# Patient Record
Sex: Male | Born: 1950 | Race: White | Hispanic: No | Smoking: Former smoker
Health system: Southern US, Community
[De-identification: ages and names within clinical notes are randomized; demographics above are authoritative.]

## PROBLEM LIST (undated history)

## (undated) DIAGNOSIS — IMO0001 Reserved for inherently not codable concepts without codable children: Secondary | ICD-10-CM

## (undated) DIAGNOSIS — I350 Nonrheumatic aortic (valve) stenosis: Secondary | ICD-10-CM

## (undated) DIAGNOSIS — I10 Essential (primary) hypertension: Secondary | ICD-10-CM

## (undated) DIAGNOSIS — I517 Cardiomegaly: Secondary | ICD-10-CM

## (undated) DIAGNOSIS — I4891 Unspecified atrial fibrillation: Secondary | ICD-10-CM

## (undated) DIAGNOSIS — I509 Heart failure, unspecified: Secondary | ICD-10-CM

## (undated) DIAGNOSIS — Z923 Personal history of irradiation: Secondary | ICD-10-CM

## (undated) HISTORY — PX: FRACTURE SURGERY: SHX138

## (undated) HISTORY — PX: COLONOSCOPY: SHX174

## (undated) HISTORY — PX: NECK SURGERY: SHX720

---

## 1998-09-24 ENCOUNTER — Emergency Department (HOSPITAL_COMMUNITY): Admission: EM | Admit: 1998-09-24 | Discharge: 1998-09-24 | Payer: Self-pay | Admitting: Emergency Medicine

## 1998-09-24 ENCOUNTER — Encounter: Payer: Self-pay | Admitting: Emergency Medicine

## 1999-11-04 ENCOUNTER — Encounter: Payer: Self-pay | Admitting: Emergency Medicine

## 1999-11-04 ENCOUNTER — Inpatient Hospital Stay (HOSPITAL_COMMUNITY): Admission: EM | Admit: 1999-11-04 | Discharge: 1999-11-06 | Payer: Self-pay | Admitting: Emergency Medicine

## 1999-11-05 ENCOUNTER — Encounter: Payer: Self-pay | Admitting: Emergency Medicine

## 1999-11-13 ENCOUNTER — Encounter: Admission: RE | Admit: 1999-11-13 | Discharge: 1999-11-13 | Payer: Self-pay | Admitting: Family Medicine

## 1999-12-31 ENCOUNTER — Encounter: Payer: Self-pay | Admitting: Emergency Medicine

## 1999-12-31 ENCOUNTER — Emergency Department (HOSPITAL_COMMUNITY): Admission: EM | Admit: 1999-12-31 | Discharge: 1999-12-31 | Payer: Self-pay | Admitting: Emergency Medicine

## 2005-09-08 ENCOUNTER — Inpatient Hospital Stay (HOSPITAL_COMMUNITY): Admission: EM | Admit: 2005-09-08 | Discharge: 2005-09-10 | Payer: Self-pay | Admitting: Emergency Medicine

## 2013-02-05 DIAGNOSIS — G44209 Tension-type headache, unspecified, not intractable: Secondary | ICD-10-CM | POA: Insufficient documentation

## 2013-04-22 DIAGNOSIS — Z8679 Personal history of other diseases of the circulatory system: Secondary | ICD-10-CM | POA: Insufficient documentation

## 2013-04-22 DIAGNOSIS — E669 Obesity, unspecified: Secondary | ICD-10-CM | POA: Insufficient documentation

## 2013-10-21 DIAGNOSIS — IMO0002 Reserved for concepts with insufficient information to code with codable children: Secondary | ICD-10-CM | POA: Insufficient documentation

## 2013-11-01 DIAGNOSIS — I10 Essential (primary) hypertension: Secondary | ICD-10-CM | POA: Insufficient documentation

## 2014-02-17 DIAGNOSIS — I4891 Unspecified atrial fibrillation: Secondary | ICD-10-CM | POA: Insufficient documentation

## 2014-02-17 DIAGNOSIS — Z7901 Long term (current) use of anticoagulants: Secondary | ICD-10-CM | POA: Insufficient documentation

## 2015-01-03 ENCOUNTER — Encounter (HOSPITAL_COMMUNITY): Payer: Self-pay | Admitting: *Deleted

## 2015-01-03 ENCOUNTER — Observation Stay (HOSPITAL_COMMUNITY)
Admission: EM | Admit: 2015-01-03 | Discharge: 2015-01-04 | Discharge status: Against Medical Advice | Payer: Self-pay | Attending: Internal Medicine | Admitting: Internal Medicine

## 2015-01-03 ENCOUNTER — Emergency Department (HOSPITAL_COMMUNITY): Payer: Self-pay

## 2015-01-03 DIAGNOSIS — I5033 Acute on chronic diastolic (congestive) heart failure: Principal | ICD-10-CM | POA: Insufficient documentation

## 2015-01-03 DIAGNOSIS — F17290 Nicotine dependence, other tobacco product, uncomplicated: Secondary | ICD-10-CM | POA: Insufficient documentation

## 2015-01-03 DIAGNOSIS — E785 Hyperlipidemia, unspecified: Secondary | ICD-10-CM | POA: Insufficient documentation

## 2015-01-03 DIAGNOSIS — I4891 Unspecified atrial fibrillation: Secondary | ICD-10-CM | POA: Diagnosis present

## 2015-01-03 DIAGNOSIS — Z7901 Long term (current) use of anticoagulants: Secondary | ICD-10-CM

## 2015-01-03 DIAGNOSIS — I350 Nonrheumatic aortic (valve) stenosis: Secondary | ICD-10-CM | POA: Diagnosis present

## 2015-01-03 DIAGNOSIS — R0601 Orthopnea: Secondary | ICD-10-CM

## 2015-01-03 DIAGNOSIS — I50810 Right heart failure, unspecified: Secondary | ICD-10-CM

## 2015-01-03 DIAGNOSIS — I482 Chronic atrial fibrillation: Secondary | ICD-10-CM

## 2015-01-03 DIAGNOSIS — R7989 Other specified abnormal findings of blood chemistry: Secondary | ICD-10-CM

## 2015-01-03 DIAGNOSIS — I517 Cardiomegaly: Secondary | ICD-10-CM | POA: Diagnosis present

## 2015-01-03 DIAGNOSIS — R0600 Dyspnea, unspecified: Secondary | ICD-10-CM | POA: Diagnosis present

## 2015-01-03 DIAGNOSIS — I10 Essential (primary) hypertension: Secondary | ICD-10-CM | POA: Diagnosis present

## 2015-01-03 HISTORY — DX: Unspecified atrial fibrillation: I48.91

## 2015-01-03 HISTORY — DX: Essential (primary) hypertension: I10

## 2015-01-03 HISTORY — DX: Cardiomegaly: I51.7

## 2015-01-03 LAB — CBC WITH DIFFERENTIAL/PLATELET
BASOS PCT: 0 % (ref 0–1)
Basophils Absolute: 0 10*3/uL (ref 0.0–0.1)
EOS PCT: 1 % (ref 0–5)
Eosinophils Absolute: 0.1 10*3/uL (ref 0.0–0.7)
HEMATOCRIT: 44.7 % (ref 39.0–52.0)
HEMOGLOBIN: 15.8 g/dL (ref 13.0–17.0)
Lymphocytes Relative: 20 % (ref 12–46)
Lymphs Abs: 2.2 10*3/uL (ref 0.7–4.0)
MCH: 31.3 pg (ref 26.0–34.0)
MCHC: 35.3 g/dL (ref 30.0–36.0)
MCV: 88.7 fL (ref 78.0–100.0)
MONO ABS: 1 10*3/uL (ref 0.1–1.0)
MONOS PCT: 9 % (ref 3–12)
NEUTROS ABS: 7.7 10*3/uL (ref 1.7–7.7)
Neutrophils Relative %: 70 % (ref 43–77)
Platelets: 167 10*3/uL (ref 150–400)
RBC: 5.04 MIL/uL (ref 4.22–5.81)
RDW: 14.3 % (ref 11.5–15.5)
WBC: 10.9 10*3/uL — ABNORMAL HIGH (ref 4.0–10.5)

## 2015-01-03 LAB — COMPREHENSIVE METABOLIC PANEL
ALBUMIN: 3.8 g/dL (ref 3.5–5.0)
ALK PHOS: 51 U/L (ref 38–126)
ALT: 15 U/L — AB (ref 17–63)
AST: 20 U/L (ref 15–41)
Anion gap: 9 (ref 5–15)
BUN: 10 mg/dL (ref 6–20)
CALCIUM: 9.2 mg/dL (ref 8.9–10.3)
CHLORIDE: 100 mmol/L — AB (ref 101–111)
CO2: 25 mmol/L (ref 22–32)
Creatinine, Ser: 0.74 mg/dL (ref 0.61–1.24)
GFR calc Af Amer: >60 mL/min (ref >60)
GFR calc non Af Amer: >60 mL/min (ref >60)
Glucose, Bld: 105 mg/dL — ABNORMAL HIGH (ref 70–99)
Potassium: 4 mmol/L (ref 3.5–5.1)
Sodium: 134 mmol/L — ABNORMAL LOW (ref 135–145)
TOTAL PROTEIN: 6.7 g/dL (ref 6.5–8.1)
Total Bilirubin: 0.8 mg/dL (ref 0.3–1.2)

## 2015-01-03 LAB — BRAIN NATRIURETIC PEPTIDE: B NATRIURETIC PEPTIDE 5: 327 pg/mL — AB (ref 0.0–100.0)

## 2015-01-03 LAB — TROPONIN I

## 2015-01-03 LAB — I-STAT TROPONIN, ED: Troponin i, poc: 0 ng/mL (ref 0.00–0.08)

## 2015-01-03 LAB — APTT: aPTT: 40 seconds — ABNORMAL HIGH (ref 24–37)

## 2015-01-03 LAB — PROTIME-INR
INR: 1.6 — ABNORMAL HIGH (ref 0.00–1.49)
Prothrombin Time: 19.2 seconds — ABNORMAL HIGH (ref 11.6–15.2)

## 2015-01-03 MED ORDER — SODIUM CHLORIDE 0.9 % IJ SOLN: 3.0000 mL | INTRAMUSCULAR | Status: DC | PRN

## 2015-01-03 MED ORDER — ALBUTEROL SULFATE (2.5 MG/3ML) 0.083% IN NEBU: 2.5000 mg | INHALATION_SOLUTION | RESPIRATORY_TRACT | Status: DC | PRN

## 2015-01-03 MED ORDER — WARFARIN - PHARMACIST DOSING INPATIENT: Freq: Every day | Status: DC

## 2015-01-03 MED ORDER — ASPIRIN 81 MG PO CHEW
324.0000 mg | CHEWABLE_TABLET | Freq: Once | ORAL | Status: AC
  Administered 2015-01-03: 324 mg via ORAL
  Filled 2015-01-03: qty 4

## 2015-01-03 MED ORDER — ONDANSETRON HCL 4 MG/2ML IJ SOLN: 4.0000 mg | Freq: Four times a day (QID) | INTRAMUSCULAR | Status: DC | PRN

## 2015-01-03 MED ORDER — SODIUM CHLORIDE 0.9 % IV SOLN: 250.0000 mL | INTRAVENOUS | Status: DC | PRN

## 2015-01-03 MED ORDER — SODIUM CHLORIDE 0.9 % IJ SOLN
3.0000 mL | Freq: Two times a day (BID) | INTRAMUSCULAR | Status: DC
  Administered 2015-01-03 – 2015-01-04 (×2): 3 mL via INTRAVENOUS

## 2015-01-03 MED ORDER — ASPIRIN EC 81 MG PO TBEC
81.0000 mg | DELAYED_RELEASE_TABLET | Freq: Every day | ORAL | Status: DC
  Administered 2015-01-03 – 2015-01-04 (×2): 81 mg via ORAL
  Filled 2015-01-03 (×2): qty 1

## 2015-01-03 MED ORDER — METOPROLOL TARTRATE 12.5 MG HALF TABLET
12.5000 mg | ORAL_TABLET | Freq: Every day | ORAL | Status: DC
  Administered 2015-01-04: 12.5 mg via ORAL
  Filled 2015-01-03 (×2): qty 1

## 2015-01-03 MED ORDER — LISINOPRIL 40 MG PO TABS
40.0000 mg | ORAL_TABLET | Freq: Every day | ORAL | Status: DC
  Administered 2015-01-03 – 2015-01-04 (×2): 40 mg via ORAL
  Filled 2015-01-03 (×2): qty 1

## 2015-01-03 MED ORDER — AMLODIPINE BESYLATE 10 MG PO TABS
10.0000 mg | ORAL_TABLET | Freq: Every day | ORAL | Status: DC
  Administered 2015-01-03 – 2015-01-04 (×2): 10 mg via ORAL
  Filled 2015-01-03 (×2): qty 1

## 2015-01-03 MED ORDER — FUROSEMIDE 10 MG/ML IJ SOLN
40.0000 mg | Freq: Every day | INTRAMUSCULAR | Status: DC
  Administered 2015-01-03 – 2015-01-04 (×2): 40 mg via INTRAVENOUS
  Filled 2015-01-03 (×2): qty 4

## 2015-01-03 MED ORDER — ACETAMINOPHEN 325 MG PO TABS: 650.0000 mg | ORAL_TABLET | ORAL | Status: DC | PRN

## 2015-01-03 MED ORDER — WARFARIN SODIUM 5 MG PO TABS
5.0000 mg | ORAL_TABLET | Freq: Once | ORAL | Status: AC
  Administered 2015-01-03: 5 mg via ORAL
  Filled 2015-01-03: qty 1

## 2015-01-03 MED ORDER — GUAIFENESIN ER 600 MG PO TB12
600.0000 mg | ORAL_TABLET | Freq: Two times a day (BID) | ORAL | Status: DC
  Administered 2015-01-03 – 2015-01-04 (×2): 600 mg via ORAL
  Filled 2015-01-03 (×3): qty 1

## 2015-01-03 MED ORDER — PRAVASTATIN SODIUM 80 MG PO TABS
80.0000 mg | ORAL_TABLET | Freq: Every day | ORAL | Status: DC
  Administered 2015-01-03 – 2015-01-04 (×2): 80 mg via ORAL
  Filled 2015-01-03 (×2): qty 1

## 2015-01-03 NOTE — H&P (Addendum)
PCP: Alvino Blood [phone]   Referring provider Dorie Rank   Chief Complaint:  Abnormal CXR  HPI: Kevin Martin is a 64 y.o. male   has a past medical history of Hypertension and Atrial fibrillation.   Presented with 4 day hx of shortness of breath worse with laying down flat. He reports some bilateral leg swelling but its chronic and mild.  He denies any chest pain. He reports feeling smothered when he lay. Denies shortness of breath on exertion. He has had some cough productive of clear or white phlegm. Unsure if he had a fever but had some chills. He went to his PCP and had a CXR done. His PCP was concerned about CHF and asked patient to present to ER. In emergency department troponin was negative EKG showed mild ST segment depressions in the lateral leads. Chest x-ray showing cardiomegaly which is persistent from 2007 normal evidence of fluid overload.  Hospitalist was called for admission for cardiomegaly  Review of Systems:    Pertinent positives include:  Chills, chest pressure, shortness of breath at rest. productive cough, excess mucus   Constitutional:  No weight loss, night sweats, Fevers,  fatigue, weight loss  HEENT:  No headaches, Difficulty swallowing,Tooth/dental problems,Sore throat,  No sneezing, itching, ear ache, nasal congestion, post nasal drip,  Cardio-vascular:    Orthopnea, PND, anasarca, dizziness, palpitations.no Bilateral lower extremity swelling  GI:  No heartburn, indigestion, abdominal pain, nausea, vomiting, diarrhea, change in bowel habits, loss of appetite, melena, blood in stool, hematemesis Resp:  no No dyspnea on exertion,    No non-productive cough, No coughing up of blood.No change in color of mucus.No wheezing. Skin:  no rash or lesions. No jaundice GU:  no dysuria, change in color of urine, no urgency or frequency. No straining to urinate.  No flank pain.  Musculoskeletal:  No joint pain or no joint swelling. No decreased range of  motion. No back pain.  Psych:  No change in mood or affect. No depression or anxiety. No memory loss.  Neuro: no localizing neurological complaints, no tingling, no weakness, no double vision, no gait abnormality, no slurred speech, no confusion  Otherwise ROS are negative except for above, 10 systems were reviewed  Past Medical History: Past Medical History  Diagnosis Date  . Hypertension   . Atrial fibrillation    History reviewed. No pertinent past surgical history.   Medications: Prior to Admission medications   Medication Sig Start Date End Date Taking? Authorizing Provider  amLODipine (NORVASC) 10 MG tablet Take 10 mg by mouth daily.   Yes Historical Provider, MD  lisinopril (PRINIVIL,ZESTRIL) 40 MG tablet Take 40 mg by mouth daily.   Yes Historical Provider, MD  metoprolol tartrate (LOPRESSOR) 25 MG tablet Take 12.5 mg by mouth daily.    Yes Historical Provider, MD  Multiple Vitamin (MULTIVITAMIN WITH MINERALS) TABS tablet Take 1 tablet by mouth daily.   Yes Historical Provider, MD  omega-3 acid ethyl esters (LOVAZA) 1 G capsule Take 1 g by mouth 3 (three) times daily.   Yes Historical Provider, MD  pravastatin (PRAVACHOL) 80 MG tablet Take 80 mg by mouth daily.   Yes Historical Provider, MD  warfarin (COUMADIN) 5 MG tablet Take 5 mg by mouth daily.   Yes Historical Provider, MD    Allergies:  No Known Allergies  Social History:  Ambulatory  independently   Lives at home alone With family     reports that he has been smoking Cigars.  He does not have any smokeless tobacco history on file. He reports that he drinks alcohol. He reports that he does not use illicit drugs.    Family History: family history includes Cancer in his father and mother.    Physical Exam: Patient Vitals for the past 24 hrs:  BP Temp Pulse Resp SpO2 Height Weight  01/03/15 2115 129/56 mmHg - (!) 50 19 94 % - -  01/03/15 2045 132/69 mmHg - (!) 55 21 94 % - -  01/03/15 2015 (!) 123/53 mmHg -  71 20 95 % - -  01/03/15 2000 149/67 mmHg - (!) 55 18 97 % - -  01/03/15 1945 135/59 mmHg - (!) 55 22 94 % - -  01/03/15 1930 (!) 116/50 mmHg - (!) 52 23 94 % - -  01/03/15 1759 139/61 mmHg 98.4 F (36.9 C) 62 20 96 % '6\' 2"'$  (1.88 m) 106.595 kg (235 lb)    1. General:  in No Acute distress 2. Psychological: Alert and Oriented 3. Head/ENT:   Moist   Mucous Membranes                          Head Non traumatic, neck supple                          Normal  Dentition 4. SKIN: normal  Skin turgor,  Skin clean Dry and intact no rash 5. Heart: Regular rate and rhythm systolic Murmur, Rub or gallop 6. Lungs:  no wheezes mild bibasal or crackles   7. Abdomen: Soft, non-tender, slightly distended 8. Lower extremities: no clubbing, cyanosis, trace edema 9. Neurologically Grossly intact, moving all 4 extremities equally 10. MSK: Normal range of motion  body mass index is 30.16 kg/(m^2).   Labs on Admission:   Results for orders placed or performed during the hospital encounter of 01/03/15 (from the past 24 hour(s))  BNP (order ONLY if patient complains of dyspnea/SOB AND you have documented it for THIS visit)     Status: Abnormal   Collection Time: 01/03/15  6:22 PM  Result Value Ref Range   B Natriuretic Peptide 327.0 (H) 0.0 - 100.0 pg/mL  Troponin I     Status: None   Collection Time: 01/03/15  6:22 PM  Result Value Ref Range   Troponin I <0.03 <0.031 ng/mL  CBC with Differential     Status: Abnormal   Collection Time: 01/03/15  6:22 PM  Result Value Ref Range   WBC 10.9 (H) 4.0 - 10.5 K/uL   RBC 5.04 4.22 - 5.81 MIL/uL   Hemoglobin 15.8 13.0 - 17.0 g/dL   HCT 44.7 39.0 - 52.0 %   MCV 88.7 78.0 - 100.0 fL   MCH 31.3 26.0 - 34.0 pg   MCHC 35.3 30.0 - 36.0 g/dL   RDW 14.3 11.5 - 15.5 %   Platelets 167 150 - 400 K/uL   Neutrophils Relative % 70 43 - 77 %   Neutro Abs 7.7 1.7 - 7.7 K/uL   Lymphocytes Relative 20 12 - 46 %   Lymphs Abs 2.2 0.7 - 4.0 K/uL   Monocytes Relative 9 3 -  12 %   Monocytes Absolute 1.0 0.1 - 1.0 K/uL   Eosinophils Relative 1 0 - 5 %   Eosinophils Absolute 0.1 0.0 - 0.7 K/uL   Basophils Relative 0 0 - 1 %   Basophils Absolute 0.0 0.0 -  0.1 K/uL  Comprehensive metabolic panel     Status: Abnormal   Collection Time: 01/03/15  6:22 PM  Result Value Ref Range   Sodium 134 (L) 135 - 145 mmol/L   Potassium 4.0 3.5 - 5.1 mmol/L   Chloride 100 (L) 101 - 111 mmol/L   CO2 25 22 - 32 mmol/L   Glucose, Bld 105 (H) 70 - 99 mg/dL   BUN 10 6 - 20 mg/dL   Creatinine, Ser 0.74 0.61 - 1.24 mg/dL   Calcium 9.2 8.9 - 10.3 mg/dL   Total Protein 6.7 6.5 - 8.1 g/dL   Albumin 3.8 3.5 - 5.0 g/dL   AST 20 15 - 41 U/L   ALT 15 (L) 17 - 63 U/L   Alkaline Phosphatase 51 38 - 126 U/L   Total Bilirubin 0.8 0.3 - 1.2 mg/dL   GFR calc non Af Amer >60 >60 mL/min   GFR calc Af Amer >60 >60 mL/min   Anion gap 9 5 - 15  Protime-INR     Status: Abnormal   Collection Time: 01/03/15  6:22 PM  Result Value Ref Range   Prothrombin Time 19.2 (H) 11.6 - 15.2 seconds   INR 1.60 (H) 0.00 - 1.49  APTT     Status: Abnormal   Collection Time: 01/03/15  6:22 PM  Result Value Ref Range   aPTT 40 (H) 24 - 37 seconds  I-stat troponin, ED (not at [facility])     Status: None   Collection Time: 01/03/15  7:12 PM  Result Value Ref Range   Troponin i, poc 0.00 0.00 - 0.08 ng/mL   Comment 3            UA pending  No results found for: HGBA1C  Estimated Creatinine Clearance: 121.4 mL/min (by C-G formula based on Cr of 0.74).  BNP (last 3 results) No results for input(s): PROBNP in the last 8760 hours.  Other results:  I have pearsonaly reviewed this: ECG REPORT  Rate: 67  Rhythm: A. fib ST&T Change: with T wave inversion in lateral leads   Filed Weights   01/03/15 1759  Weight: 106.595 kg (235 lb)     Cultures: No results found for: SDES, SPECREQUEST, CULT, REPTSTATUS   Radiological Exams on Admission: Dg Chest 2 View  01/03/2015   CLINICAL DATA:  Smoker.  Elevated  white blood cell count.  EXAM: CHEST  2 VIEW  COMPARISON:  PA and lateral chest 09/08/2005.  FINDINGS: Mild prominence of the pulmonary interstitium is seen. No consolidative process, pneumothorax or effusion is identified. There is cardiomegaly. No focal bony abnormality is identified.  IMPRESSION: Cardiomegaly without acute disease.   Electronically Signed   By: Inge Rise M.D.   On: 01/03/2015 19:00    Chart has been reviewed  Family  at  Bedside  plan of care was discussed with partner [phone] does not have MPOA  Assessment/Plan  64 year old gentleman with history of atrial fibrillation chronic anticoagulation with history of chronic cardiomegaly presents with worsening orthopnea. With chest pressure when he lays down flat and abnormal EKG.  Present on Admission:  . Dyspnea - worrisome for orthopnea. Will obtain echogram cycle cardiac enzymes give gentle diuresis and follow versus mild bronchitis. We'll give Mucinex and albuterol as needed  . Cardiomegaly - this appears to be chronic and unchanged from 2007 there is no echo in the system will obtain echogram suspect CHF with mild exacerbation.  Marland Kitchen A-fib continue metoprolol holding parameters continue Coumadin  per pharmacy  . Cardiac murmur chronic Will obtain echogram continue home medications  . Hypertension continue home meds    Prophylaxis:  Lovenox   CODE STATUS:  FULL CODE  as per patient    Disposition:  To home once workup is complete and patient is stable  Other plan as per orders.  I have spent a total of 55 min on this admission  Bellatrix Devonshire 01/03/2015, 9:34 PM  [facility]  Pager [phone]   after 2 AM please page floor coverage PA If 7AM-7PM, please contact the day team taking care of the patient  [email or web address]  Password [email or web address]

## 2015-01-03 NOTE — ED Notes (Signed)
Attempted report 

## 2015-01-03 NOTE — ED Notes (Signed)
Admitting MD at bedside.

## 2015-01-03 NOTE — ED Notes (Signed)
The pt has had some sob since Sunday night.  He was seen by his doctor today had labs xray and ekg.  He was called this afternoon that his bmp // was elevated and that he needed to come to the ed.  nio pain anywhere. He is also not sob now.. He gets that when he lies down  At night

## 2015-01-03 NOTE — Progress Notes (Addendum)
[facility] for coumadin, lovenox bridge Indication: atrial fibrillation  No Known Allergies  Patient Measurements: Height: '6\' 2"'$  (188 cm) Weight: 236 lb (107.049 kg) IBW/kg (Calculated) : 82.2   Vital Signs: Temp: 98 F (36.7 C) (05/04 2201) Temp Source: Oral (05/04 2201) BP: 131/61 mmHg (05/04 2201) Pulse Rate: 56 (05/04 2201)  Labs:  Recent Labs  01/03/15 1822  HGB 15.8  HCT 44.7  PLT 167  APTT 40*  LABPROT 19.2*  INR 1.60*  CREATININE 0.74  TROPONINI <0.03    Estimated Creatinine Clearance: 121.5 mL/min (by C-G formula based on Cr of 0.74).   Medical History: Past Medical History  Diagnosis Date  . Hypertension   . Atrial fibrillation     Medications:  Prescriptions prior to admission  Medication Sig Dispense Refill Last Dose  . amLODipine (NORVASC) 10 MG tablet Take 10 mg by mouth daily.   01/03/2015 at Unknown time  . lisinopril (PRINIVIL,ZESTRIL) 40 MG tablet Take 40 mg by mouth daily.   01/03/2015 at Unknown time  . metoprolol tartrate (LOPRESSOR) 25 MG tablet Take 12.5 mg by mouth daily.    01/03/2015 at 0600  . Multiple Vitamin (MULTIVITAMIN WITH MINERALS) TABS tablet Take 1 tablet by mouth daily.   01/03/2015 at Unknown time  . omega-3 acid ethyl esters (LOVAZA) 1 G capsule Take 1 g by mouth 3 (three) times daily.   01/03/2015 at Unknown time  . pravastatin (PRAVACHOL) 80 MG tablet Take 80 mg by mouth daily.   01/02/2015 at Unknown time  . warfarin (COUMADIN) 5 MG tablet Take 5 mg by mouth daily.   01/02/2015 at Unknown time    Assessment: 64 yo man to continue coumadin for afib.  INR remains subtherapeutic @ 1.48.  Plan to start lovenox bridge until INR therapeutic.  No bleeding noted  Goal of Therapy:  INR 2-3 Monitor platelets by anticoagulation protocol: Yes   Plan:  Coumadin 6 mg po today Lovenox 105 mg sq q12 hours Check daily PTINR Monitor for bleeding complications Thanks for allowing pharmacy to be a part of  this patient's care.  Excell Seltzer, PharmD Clinical Pharmacist, [phone] 01/03/2015,10:09 PM

## 2015-01-03 NOTE — ED Provider Notes (Signed)
CSN: [id number]     Arrival date & time 01/03/15  1744 History   First MD Initiated Contact with Patient 01/03/15 1826     Chief Complaint  Patient presents with  . Abnormal Lab    HPI Pt was having pressure in his chest and a sensation that he was being smothered Sunday through last evening.  He saw his primary care doctor today and had blood tests.  He was told that the blood tests were abnormal.  Possible congestive heart failure and bronchial displacement but none of the tests were sent with him.  The symptoms occur every night.  Increase with lying flat.  Improve with sitting up right.  No chest pain.  Pt does smoke. Past Medical History  Diagnosis Date  . Hypertension   . Atrial fibrillation    History reviewed. No pertinent past surgical history. No family history on file. History  Substance Use Topics  . Smoking status: Current Every Day Smoker  . Smokeless tobacco: Not on file  . Alcohol Use: Yes    Review of Systems  All other systems reviewed and are negative.     Allergies  Review of patient's allergies indicates no known allergies.  Home Medications   Prior to Admission medications   Medication Sig Start Date End Date Taking? Authorizing Provider  amLODipine (NORVASC) 10 MG tablet Take 10 mg by mouth daily.   Yes Historical Provider, MD  lisinopril (PRINIVIL,ZESTRIL) 40 MG tablet Take 40 mg by mouth daily.   Yes Historical Provider, MD  metoprolol tartrate (LOPRESSOR) 25 MG tablet Take 12.5 mg by mouth 2 (two) times daily.   Yes Historical Provider, MD  Multiple Vitamin (MULTIVITAMIN WITH MINERALS) TABS tablet Take 1 tablet by mouth daily.   Yes Historical Provider, MD  omega-3 acid ethyl esters (LOVAZA) 1 G capsule Take 1 g by mouth 3 (three) times daily.   Yes Historical Provider, MD  pravastatin (PRAVACHOL) 80 MG tablet Take 80 mg by mouth daily.   Yes Historical Provider, MD  warfarin (COUMADIN) 5 MG tablet Take 5 mg by mouth daily.   Yes Historical Provider,  MD   BP 123/53 mmHg  Pulse 71  Temp(Src) 98.4 F (36.9 C)  Resp 20  Ht '6\' 2"'$  (1.88 m)  Wt 235 lb (106.595 kg)  BMI 30.16 kg/m2  SpO2 95% Physical Exam  Constitutional: He appears well-developed and well-nourished. No distress.  HENT:  Head: Normocephalic and atraumatic.  Right Ear: External ear normal.  Left Ear: External ear normal.  Eyes: Conjunctivae are normal. Right eye exhibits no discharge. Left eye exhibits no discharge. No scleral icterus.  Neck: Neck supple. No tracheal deviation present.  Cardiovascular: Normal rate and intact distal pulses.  An irregularly irregular rhythm present.  Murmur heard.  Systolic murmur is present  Pulmonary/Chest: Effort normal and breath sounds normal. No stridor. No respiratory distress. He has no wheezes. He has no rales (few at bases).  Abdominal: Soft. Bowel sounds are normal. He exhibits no distension. There is no tenderness. There is no rebound and no guarding.  Musculoskeletal: He exhibits edema. He exhibits no tenderness.  Neurological: He is alert. He has normal strength. No cranial nerve deficit (no facial droop, extraocular movements intact, no slurred speech) or sensory deficit. He exhibits normal muscle tone. He displays no seizure activity. Coordination normal.  Skin: Skin is warm and dry. No rash noted.  Psychiatric: He has a normal mood and affect.  Nursing note and vitals reviewed.   ED  Course  Procedures (including critical care time) Labs Review Labs Reviewed  BRAIN NATRIURETIC PEPTIDE - Abnormal; Notable for the following:    B Natriuretic Peptide 327.0 (*)    All other components within normal limits  CBC WITH DIFFERENTIAL/PLATELET - Abnormal; Notable for the following:    WBC 10.9 (*)    All other components within normal limits  COMPREHENSIVE METABOLIC PANEL - Abnormal; Notable for the following:    Sodium 134 (*)    Chloride 100 (*)    Glucose, Bld 105 (*)    ALT 15 (*)    All other components within normal  limits  PROTIME-INR - Abnormal; Notable for the following:    Prothrombin Time 19.2 (*)    INR 1.60 (*)    All other components within normal limits  APTT - Abnormal; Notable for the following:    aPTT 40 (*)    All other components within normal limits  TROPONIN I  Randolm Idol, ED    Imaging Review Dg Chest 2 View  01/03/2015   CLINICAL DATA:  Smoker.  Elevated white blood cell count.  EXAM: CHEST  2 VIEW  COMPARISON:  PA and lateral chest 09/08/2005.  FINDINGS: Mild prominence of the pulmonary interstitium is seen. No consolidative process, pneumothorax or effusion is identified. There is cardiomegaly. No focal bony abnormality is identified.  IMPRESSION: Cardiomegaly without acute disease.   Electronically Signed   By: Inge Rise M.D.   On: 01/03/2015 19:00     EKG Interpretation   Date/Time:  Wednesday Jan 03 2015 18:13:07 EDT Ventricular Rate:  67 PR Interval:    QRS Duration: 100 QT Interval:  418 QTC Calculation: 441 R Axis:   67 Text Interpretation:  Atrial fibrillation Septal infarct , age  undetermined ST' \\T'$ \ T wave abnormality, consider inferolateral ischemia  Abnormal ECG a fib is new since last tracing Confirmed by Kloi Brodman  MD-J, Owens Hara  ([id number]) on 01/03/2015 6:36:47 PM      MDM   Final diagnoses:  Elevated brain natriuretic peptide (BNP) level  Orthopnea    Pt's symptoms are concerning for CHF.  He does have orthopnea and an elevated BNP.  The chest x-ray shows cardiomegaly but no pulmonary edema.  Patient does not have a history of congestive heart failure.  Considered starting a diuretic and close outpatient follow up however he was sent to the hospital by his PCP today after the same testing done in the ED.  May benefit from diuresis over night, echocardiogram, cardiology consult.   Dorie Rank, MD 01/03/15 2045

## 2015-01-04 ENCOUNTER — Ambulatory Visit (HOSPITAL_COMMUNITY): Payer: Self-pay

## 2015-01-04 ENCOUNTER — Encounter (HOSPITAL_COMMUNITY): Payer: Self-pay | Admitting: General Practice

## 2015-01-04 DIAGNOSIS — I5033 Acute on chronic diastolic (congestive) heart failure: Principal | ICD-10-CM

## 2015-01-04 DIAGNOSIS — R011 Cardiac murmur, unspecified: Secondary | ICD-10-CM

## 2015-01-04 DIAGNOSIS — Z7901 Long term (current) use of anticoagulants: Secondary | ICD-10-CM

## 2015-01-04 LAB — COMPREHENSIVE METABOLIC PANEL
ALBUMIN: 3.7 g/dL (ref 3.5–5.0)
ALK PHOS: 57 U/L (ref 38–126)
ALT: 15 U/L — AB (ref 17–63)
AST: 19 U/L (ref 15–41)
Anion gap: 9 (ref 5–15)
BUN: 12 mg/dL (ref 6–20)
CO2: 27 mmol/L (ref 22–32)
Calcium: 9.1 mg/dL (ref 8.9–10.3)
Chloride: 102 mmol/L (ref 101–111)
Creatinine, Ser: 0.86 mg/dL (ref 0.61–1.24)
GFR calc Af Amer: >60 mL/min (ref >60)
GFR calc non Af Amer: >60 mL/min (ref >60)
Glucose, Bld: 117 mg/dL — ABNORMAL HIGH (ref 70–99)
POTASSIUM: 4.1 mmol/L (ref 3.5–5.1)
Sodium: 138 mmol/L (ref 135–145)
TOTAL PROTEIN: 6.7 g/dL (ref 6.5–8.1)
Total Bilirubin: 1 mg/dL (ref 0.3–1.2)

## 2015-01-04 LAB — BRAIN NATRIURETIC PEPTIDE: B NATRIURETIC PEPTIDE 5: 267.8 pg/mL — AB (ref 0.0–100.0)

## 2015-01-04 LAB — TROPONIN I
TROPONIN I: 0.03 ng/mL (ref <0.031)
Troponin I: <0.03 ng/mL (ref <0.031)

## 2015-01-04 LAB — TSH: TSH: 1.262 u[IU]/mL (ref 0.350–4.500)

## 2015-01-04 LAB — PROTIME-INR
INR: 1.48 (ref 0.00–1.49)
Prothrombin Time: 18.1 seconds — ABNORMAL HIGH (ref 11.6–15.2)

## 2015-01-04 LAB — MAGNESIUM: Magnesium: 1.9 mg/dL (ref 1.7–2.4)

## 2015-01-04 MED ORDER — WARFARIN SODIUM 6 MG PO TABS
6.0000 mg | ORAL_TABLET | Freq: Once | ORAL | Status: DC
  Filled 2015-01-04: qty 1

## 2015-01-04 MED ORDER — ENOXAPARIN SODIUM 120 MG/0.8ML ~~LOC~~ SOLN
105.0000 mg | Freq: Two times a day (BID) | SUBCUTANEOUS | Status: DC
  Filled 2015-01-04 (×2): qty 0.8

## 2015-01-04 NOTE — Progress Notes (Signed)
Pt stating his daughter was involved in a car accident in [facility].  Pt stating he is leaving.  MD notified to see if pt could get an early DC.  Pt is not medically ready for DC today according to MD.  Pt educated and still verbalized that he was leaving anyway.  Pt signed AMA paper and IV DC.

## 2015-01-04 NOTE — Discharge Summary (Signed)
Physician Discharge Summary  Kevin Martin LZJ:[id number] DOB: 1950-12-06 DOA: 01/03/2015  PCP: Alvino Blood [phone])  Admit date: 01/03/2015 Discharge date: 01/04/2015  Time spent: 30 minutes  Recommendations for Outpatient Follow-up:  Patient left AMA  Discharge Diagnoses:  Active Problems:   CHF (congestive heart failure)   Cardiomegaly   A-fib   Cardiac murmur   Chronic anticoagulation   Hypertension   Dyspnea   Discharge Condition: still with SOB and finidngs on physical exam that supported fluid overload. Patient left AMA.  Filed Weights   01/03/15 1759 01/03/15 2201 01/04/15 0542  Weight: 106.595 kg (235 lb) 107.049 kg (236 lb) 105.8 kg (233 lb 4 oz)    History of present illness:  64 y/o male with PMH significant for HTN, HLD and atrial fibrillation (on coumadin); Presented with 4 day hx of shortness of breath worse with laying down flat. He reports some bilateral leg swelling but its chronic and mild.  He denies any chest pain. He reports feeling smothered when he lay. Denies shortness of breath on exertion. He has had some cough productive of clear or white phlegm. Unsure if he had a fever but had some chills. He went to his PCP and had a CXR done. His PCP was concerned about CHF and asked patient to present to ER. Found with elavated BNP, ST depressions on EKG and vascular congestion/cardiomgaly on CXR. Admitted for CHF exacerbation.  Hospital Course:  1-SOB and orthopnea: due to acute on chronic diastolic heart failure. -preserved EF on echo -no wall motion abnormalities -moderate aortic stenosis (most likely contributing as well) -patient still SOB and with physical findings demnstrating ongoing HF exacerbation -will require 1-2 more days of IV lasix -discussed about importance of low sodium diet, daily weights and observation on fluid intake -will continue strict intake and output -troponin neg X3  2-atrial fibrillation: rate was stable -INR subtherapeutic  at 1.48 -plan was to bridge with lovenox while adjusting his coumadin level -goal was INR 2-3 -patient informed of need of adjustment and the fact he was not protected at this moment for cardioembolic event  3-HTN: BP stable -will monitor and continue home medication regimen  4-HLD: continue statins  *Patient heard his daughter had a MVA in [facility] and decide to leave AMA; he was not optimal for discharge and despite discussing the risk and consequences he opted to leave. By the time we discussed leaving AMA his 2-D echo was still pending to be read by cardiology; making even more difficult decisions of stability for him.  Procedures: 2-D echo - Left ventricle: The cavity size was normal. There was moderate concentric hypertrophy. Systolic function was normal. The estimated ejection fraction was in the range of 55% to 60%. Wall motion was normal; there were no regional wall motion abnormalities. - Aortic valve: Valve mobility was mildly restricted. There was moderate stenosis. There was mild regurgitation. - Aortic root: The aortic root was mildly dilated. - Mitral valve: There was mild regurgitation. - Left atrium: The atrium was severely dilated. - Right ventricle: The cavity size was mildly dilated. Wall thickness was normal. - Right atrium: The atrium was moderately dilated. - Pericardium, extracardiac: A trivial pericardial effusion was identified.    Consultations:  None   Discharge Exam: Filed Vitals:   01/04/15 0946  BP: 131/53  Pulse: 56  Temp:   Resp:     General: feeling slightly better, but still SOB and with dry cough. No CP and no fever Cardiovascular: S1 and S2,  positive SEM, no rubs or gallops; mild JVD appreciated on exam Respiratory: bibasilar crackles and decrease BS at the bases, no wheezing Abd: soft, NT, ND, positive BS Extremities: bilateral LE wit trace edema  Discharge Instructions No instructions able to be given as patient left  AMA.   Current Discharge Medication List    CONTINUE these medications which have NOT CHANGED   Details  amLODipine (NORVASC) 10 MG tablet Take 10 mg by mouth daily.    lisinopril (PRINIVIL,ZESTRIL) 40 MG tablet Take 40 mg by mouth daily.    metoprolol tartrate (LOPRESSOR) 25 MG tablet Take 12.5 mg by mouth daily.     Multiple Vitamin (MULTIVITAMIN WITH MINERALS) TABS tablet Take 1 tablet by mouth daily.    omega-3 acid ethyl esters (LOVAZA) 1 G capsule Take 1 g by mouth 3 (three) times daily.    pravastatin (PRAVACHOL) 80 MG tablet Take 80 mg by mouth daily.    warfarin (COUMADIN) 5 MG tablet Take 5 mg by mouth daily.       No Known Allergies    The results of significant diagnostics from this hospitalization (including imaging, microbiology, ancillary and laboratory) are listed below for reference.    Significant Diagnostic Studies: Dg Chest 2 View  01/03/2015   CLINICAL DATA:  Smoker.  Elevated white blood cell count.  EXAM: CHEST  2 VIEW  COMPARISON:  PA and lateral chest 09/08/2005.  FINDINGS: Mild prominence of the pulmonary interstitium is seen. No consolidative process, pneumothorax or effusion is identified. There is cardiomegaly. No focal bony abnormality is identified.  IMPRESSION: Cardiomegaly without acute disease.   Electronically Signed   By: Inge Rise M.D.   On: 01/03/2015 19:00    Labs: Basic Metabolic Panel:  Recent Labs Lab 01/03/15 1822 01/04/15 0415  NA 134* 138  K 4.0 4.1  CL 100* 102  CO2 25 27  GLUCOSE 105* 117*  BUN 10 12  CREATININE 0.74 0.86  CALCIUM 9.2 9.1  MG  --  1.9   Liver Function Tests:  Recent Labs Lab 01/03/15 1822 01/04/15 0415  AST 20 19  ALT 15* 15*  ALKPHOS 51 57  BILITOT 0.8 1.0  PROT 6.7 6.7  ALBUMIN 3.8 3.7   CBC:  Recent Labs Lab 01/03/15 1822  WBC 10.9*  NEUTROABS 7.7  HGB 15.8  HCT 44.7  MCV 88.7  PLT 167   Cardiac Enzymes:  Recent Labs Lab 01/03/15 1822 01/03/15 2314 01/04/15 0415  01/04/15 0937  TROPONINI <0.03 <0.03 0.03 <0.03   BNP: BNP (last 3 results)  Recent Labs  01/03/15 1822 01/04/15 0415  BNP 327.0* 267.8*     Signed:  Barton Dubois  [facility] 01/04/2015, 4:11 PM

## 2015-01-04 NOTE — Progress Notes (Signed)
*  PRELIMINARY RESULTS* Echocardiogram 2D Echocardiogram has been performed.  Leavy Cella 01/04/2015, 12:58 PM

## 2015-01-14 ENCOUNTER — Encounter (HOSPITAL_COMMUNITY): Payer: Self-pay | Admitting: Emergency Medicine

## 2015-01-14 ENCOUNTER — Inpatient Hospital Stay (HOSPITAL_COMMUNITY)
Admission: EM | Admit: 2015-01-14 | Discharge: 2015-01-16 | DRG: 291 | Disposition: A | Discharge status: Home / Self Care | Payer: Self-pay | Attending: Internal Medicine | Admitting: Internal Medicine

## 2015-01-14 ENCOUNTER — Emergency Department (HOSPITAL_COMMUNITY): Payer: Self-pay

## 2015-01-14 DIAGNOSIS — E871 Hypo-osmolality and hyponatremia: Secondary | ICD-10-CM | POA: Diagnosis present

## 2015-01-14 DIAGNOSIS — F17210 Nicotine dependence, cigarettes, uncomplicated: Secondary | ICD-10-CM | POA: Diagnosis present

## 2015-01-14 DIAGNOSIS — Z7901 Long term (current) use of anticoagulants: Secondary | ICD-10-CM

## 2015-01-14 DIAGNOSIS — I50811 Acute right heart failure: Secondary | ICD-10-CM | POA: Insufficient documentation

## 2015-01-14 DIAGNOSIS — Z9111 Patient's noncompliance with dietary regimen: Secondary | ICD-10-CM | POA: Diagnosis present

## 2015-01-14 DIAGNOSIS — I350 Nonrheumatic aortic (valve) stenosis: Secondary | ICD-10-CM

## 2015-01-14 DIAGNOSIS — J069 Acute upper respiratory infection, unspecified: Secondary | ICD-10-CM | POA: Diagnosis present

## 2015-01-14 DIAGNOSIS — I509 Heart failure, unspecified: Secondary | ICD-10-CM

## 2015-01-14 DIAGNOSIS — I5031 Acute diastolic (congestive) heart failure: Secondary | ICD-10-CM | POA: Insufficient documentation

## 2015-01-14 DIAGNOSIS — F1010 Alcohol abuse, uncomplicated: Secondary | ICD-10-CM | POA: Diagnosis present

## 2015-01-14 DIAGNOSIS — J449 Chronic obstructive pulmonary disease, unspecified: Secondary | ICD-10-CM | POA: Diagnosis present

## 2015-01-14 DIAGNOSIS — I4891 Unspecified atrial fibrillation: Secondary | ICD-10-CM | POA: Diagnosis present

## 2015-01-14 DIAGNOSIS — I5033 Acute on chronic diastolic (congestive) heart failure: Principal | ICD-10-CM | POA: Diagnosis present

## 2015-01-14 DIAGNOSIS — J9600 Acute respiratory failure, unspecified whether with hypoxia or hypercapnia: Secondary | ICD-10-CM | POA: Diagnosis present

## 2015-01-14 DIAGNOSIS — I482 Chronic atrial fibrillation: Secondary | ICD-10-CM | POA: Diagnosis present

## 2015-01-14 DIAGNOSIS — I10 Essential (primary) hypertension: Secondary | ICD-10-CM | POA: Diagnosis present

## 2015-01-14 HISTORY — DX: Reserved for inherently not codable concepts without codable children: IMO0001

## 2015-01-14 HISTORY — DX: Heart failure, unspecified: I50.9

## 2015-01-14 LAB — COMPREHENSIVE METABOLIC PANEL
ALT: 27 U/L (ref 17–63)
ANION GAP: 9 (ref 5–15)
AST: 36 U/L (ref 15–41)
Albumin: 3.8 g/dL (ref 3.5–5.0)
Alkaline Phosphatase: 56 U/L (ref 38–126)
BUN: 11 mg/dL (ref 6–20)
CO2: 24 mmol/L (ref 22–32)
Calcium: 8.9 mg/dL (ref 8.9–10.3)
Chloride: 93 mmol/L — ABNORMAL LOW (ref 101–111)
Creatinine, Ser: 0.78 mg/dL (ref 0.61–1.24)
GFR calc non Af Amer: >60 mL/min (ref >60)
GLUCOSE: 116 mg/dL — AB (ref 65–99)
POTASSIUM: 4.2 mmol/L (ref 3.5–5.1)
SODIUM: 126 mmol/L — AB (ref 135–145)
Total Bilirubin: 0.8 mg/dL (ref 0.3–1.2)
Total Protein: 6.7 g/dL (ref 6.5–8.1)

## 2015-01-14 LAB — CBC WITH DIFFERENTIAL/PLATELET
BASOS PCT: 0 % (ref 0–1)
Basophils Absolute: 0 10*3/uL (ref 0.0–0.1)
EOS PCT: 0 % (ref 0–5)
Eosinophils Absolute: 0 10*3/uL (ref 0.0–0.7)
HCT: 46.2 % (ref 39.0–52.0)
Hemoglobin: 16.3 g/dL (ref 13.0–17.0)
LYMPHS ABS: 1.1 10*3/uL (ref 0.7–4.0)
Lymphocytes Relative: 21 % (ref 12–46)
MCH: 30.5 pg (ref 26.0–34.0)
MCHC: 35.3 g/dL (ref 30.0–36.0)
MCV: 86.4 fL (ref 78.0–100.0)
MONOS PCT: 14 % — AB (ref 3–12)
Monocytes Absolute: 0.7 10*3/uL (ref 0.1–1.0)
NEUTROS PCT: 65 % (ref 43–77)
Neutro Abs: 3.4 10*3/uL (ref 1.7–7.7)
Platelets: 148 10*3/uL — ABNORMAL LOW (ref 150–400)
RBC: 5.35 MIL/uL (ref 4.22–5.81)
RDW: 13.9 % (ref 11.5–15.5)
WBC: 5.3 10*3/uL (ref 4.0–10.5)

## 2015-01-14 LAB — TROPONIN I
TROPONIN I: 0.03 ng/mL (ref <0.031)
Troponin I: 0.03 ng/mL (ref <0.031)

## 2015-01-14 LAB — PROTIME-INR
INR: 1.16 (ref 0.00–1.49)
PROTHROMBIN TIME: 14.9 s (ref 11.6–15.2)

## 2015-01-14 LAB — BRAIN NATRIURETIC PEPTIDE: B NATRIURETIC PEPTIDE 5: 246 pg/mL — AB (ref 0.0–100.0)

## 2015-01-14 MED ORDER — OMEGA-3-ACID ETHYL ESTERS 1 G PO CAPS
1.0000 g | ORAL_CAPSULE | Freq: Three times a day (TID) | ORAL | Status: DC
  Administered 2015-01-15 – 2015-01-16 (×4): 1 g via ORAL
  Filled 2015-01-14 (×8): qty 1

## 2015-01-14 MED ORDER — LORAZEPAM 1 MG PO TABS: 1.0000 mg | ORAL_TABLET | Freq: Four times a day (QID) | ORAL | Status: DC | PRN

## 2015-01-14 MED ORDER — POTASSIUM CHLORIDE CRYS ER 20 MEQ PO TBCR
40.0000 meq | EXTENDED_RELEASE_TABLET | Freq: Every day | ORAL | Status: DC
  Administered 2015-01-14 – 2015-01-16 (×3): 40 meq via ORAL
  Filled 2015-01-14 (×3): qty 2

## 2015-01-14 MED ORDER — VITAMIN B-1 100 MG PO TABS
100.0000 mg | ORAL_TABLET | Freq: Every day | ORAL | Status: DC
  Administered 2015-01-14 – 2015-01-16 (×3): 100 mg via ORAL
  Filled 2015-01-14 (×3): qty 1

## 2015-01-14 MED ORDER — ADULT MULTIVITAMIN W/MINERALS CH
1.0000 | ORAL_TABLET | Freq: Every day | ORAL | Status: DC
  Administered 2015-01-15 – 2015-01-16 (×2): 1 via ORAL
  Filled 2015-01-14 (×3): qty 1

## 2015-01-14 MED ORDER — THIAMINE HCL 100 MG/ML IJ SOLN
100.0000 mg | Freq: Every day | INTRAMUSCULAR | Status: DC
  Filled 2015-01-14: qty 1

## 2015-01-14 MED ORDER — METOPROLOL TARTRATE 12.5 MG HALF TABLET
12.5000 mg | ORAL_TABLET | Freq: Every day | ORAL | Status: DC
  Administered 2015-01-15: 12.5 mg via ORAL
  Filled 2015-01-14 (×2): qty 1

## 2015-01-14 MED ORDER — ONDANSETRON HCL 4 MG/2ML IJ SOLN: 4.0000 mg | Freq: Four times a day (QID) | INTRAMUSCULAR | Status: DC | PRN

## 2015-01-14 MED ORDER — FUROSEMIDE 10 MG/ML IJ SOLN
60.0000 mg | Freq: Two times a day (BID) | INTRAMUSCULAR | Status: DC
  Administered 2015-01-14 – 2015-01-15 (×2): 60 mg via INTRAVENOUS
  Filled 2015-01-14 (×2): qty 6

## 2015-01-14 MED ORDER — LORAZEPAM 2 MG/ML IJ SOLN: 1.0000 mg | Freq: Four times a day (QID) | INTRAMUSCULAR | Status: DC | PRN

## 2015-01-14 MED ORDER — SODIUM CHLORIDE 0.9 % IJ SOLN: 3.0000 mL | INTRAMUSCULAR | Status: DC | PRN

## 2015-01-14 MED ORDER — FUROSEMIDE 10 MG/ML IJ SOLN
40.0000 mg | Freq: Once | INTRAMUSCULAR | Status: AC
  Administered 2015-01-14: 40 mg via INTRAVENOUS
  Filled 2015-01-14: qty 4

## 2015-01-14 MED ORDER — WARFARIN SODIUM 7.5 MG PO TABS
7.5000 mg | ORAL_TABLET | Freq: Once | ORAL | Status: AC
  Administered 2015-01-14: 7.5 mg via ORAL
  Filled 2015-01-14: qty 1

## 2015-01-14 MED ORDER — ADULT MULTIVITAMIN W/MINERALS CH: 1.0000 | ORAL_TABLET | Freq: Every day | ORAL | Status: DC

## 2015-01-14 MED ORDER — ASPIRIN EC 81 MG PO TBEC
81.0000 mg | DELAYED_RELEASE_TABLET | Freq: Every day | ORAL | Status: DC
  Administered 2015-01-14 – 2015-01-16 (×3): 81 mg via ORAL
  Filled 2015-01-14 (×3): qty 1

## 2015-01-14 MED ORDER — PRAVASTATIN SODIUM 80 MG PO TABS
80.0000 mg | ORAL_TABLET | Freq: Every day | ORAL | Status: DC
  Administered 2015-01-14 – 2015-01-16 (×3): 80 mg via ORAL
  Filled 2015-01-14 (×3): qty 1

## 2015-01-14 MED ORDER — LISINOPRIL 40 MG PO TABS
40.0000 mg | ORAL_TABLET | Freq: Every day | ORAL | Status: DC
  Administered 2015-01-15 – 2015-01-16 (×2): 40 mg via ORAL
  Filled 2015-01-14 (×2): qty 1

## 2015-01-14 MED ORDER — LORAZEPAM 1 MG PO TABS
0.0000 mg | ORAL_TABLET | Freq: Four times a day (QID) | ORAL | Status: DC
  Administered 2015-01-15: 1 mg via ORAL
  Filled 2015-01-14: qty 4

## 2015-01-14 MED ORDER — FOLIC ACID 1 MG PO TABS
1.0000 mg | ORAL_TABLET | Freq: Every day | ORAL | Status: DC
Start: 2015-01-14 — End: 2015-01-16
  Administered 2015-01-14 – 2015-01-16 (×3): 1 mg via ORAL
  Filled 2015-01-14 (×3): qty 1

## 2015-01-14 MED ORDER — WARFARIN - PHARMACIST DOSING INPATIENT
Freq: Every day | Status: DC
  Administered 2015-01-14: 19:00:00
  Administered 2015-01-15: 1

## 2015-01-14 MED ORDER — LORAZEPAM 1 MG PO TABS: 0.0000 mg | ORAL_TABLET | Freq: Two times a day (BID) | ORAL | Status: DC

## 2015-01-14 MED ORDER — AMLODIPINE BESYLATE 10 MG PO TABS
10.0000 mg | ORAL_TABLET | Freq: Every day | ORAL | Status: DC
Start: 2015-01-15 — End: 2015-01-16
  Administered 2015-01-15 – 2015-01-16 (×2): 10 mg via ORAL
  Filled 2015-01-14 (×2): qty 1

## 2015-01-14 MED ORDER — SODIUM CHLORIDE 0.9 % IJ SOLN
3.0000 mL | Freq: Two times a day (BID) | INTRAMUSCULAR | Status: DC
  Administered 2015-01-14 – 2015-01-16 (×4): 3 mL via INTRAVENOUS

## 2015-01-14 MED ORDER — ACETAMINOPHEN 325 MG PO TABS: 650.0000 mg | ORAL_TABLET | ORAL | Status: DC | PRN

## 2015-01-14 MED ORDER — WARFARIN SODIUM 2.5 MG PO TABS: 2.5000 mg | ORAL_TABLET | ORAL | Status: DC

## 2015-01-14 MED ORDER — SODIUM CHLORIDE 0.9 % IV SOLN: 250.0000 mL | INTRAVENOUS | Status: DC | PRN

## 2015-01-14 NOTE — ED Notes (Signed)
Pt. Stated, I've been SOB for 2 weeks, and its been pretty bad.

## 2015-01-14 NOTE — Progress Notes (Signed)
ANTICOAGULATION CONSULT NOTE - Initial Consult  Pharmacy Consult for Warfarin  Indication: atrial fibrillation  No Known Allergies  Patient Measurements: Height: '6\' 2"'$  (188 cm) Weight: 238 lb (107.956 kg) IBW/kg (Calculated) : 82.2   Vital Signs: Temp: 98.7 F (37.1 C) (05/15 1205) Temp Source: Oral (05/15 1205) BP: 123/73 mmHg (05/15 1407) Pulse Rate: 60 (05/15 1407)  Labs:  Recent Labs  01/14/15 1239  HGB 16.3  HCT 46.2  PLT 148*  LABPROT 14.9  INR 1.16  CREATININE 0.78  TROPONINI 0.03    Estimated Creatinine Clearance: 122 mL/min (by C-G formula based on Cr of 0.78).   Medical History: Past Medical History  Diagnosis Date  . Hypertension   . Atrial fibrillation   . Cardiomegaly     Medications:   (Not in a hospital admission)  Assessment: 27 YOM with gradual onset and worsening of SOB for the past 2 weeks. He is on Coumadin at home for h/o of Afib. He takes Coumadin 2.5 mg on M/W/F and 5 mg on all other days. His last dose was Coumadin was yesterday. Pharmacy consulted to resume therapy. INR on admission is sub-therapeutic at 1.16. H/H wnl. Plt 148.   Goal of Therapy:  INR 2-3 Monitor platelets by anticoagulation protocol: Yes   Plan:  -Give Coumadin 7.5 mg x 1 dose today. Will plan on giving higher than home dose till patient's INR nears therapeutic range -Monitor daily PT/INR   Albertina Parr, PharmD., BCPS Clinical Pharmacist Pager [phone]

## 2015-01-14 NOTE — Progress Notes (Signed)
Known AF-- CHADVASC = 2  Erin Hearing ANP

## 2015-01-14 NOTE — ED Provider Notes (Signed)
CSN: [id number]     Arrival date & time 01/14/15  1159 History   First MD Initiated Contact with Patient 01/14/15 1217     Chief Complaint  Patient presents with  . Shortness of Breath      HPI Pt was seen at 1215. Per pt and his wife, c/o gradual onset and worsening of persistent SOB for the past 2 weeks. Pt was admitted to the hospital for acute CHF and left AMA on 01/04/15. Pt states he "will stay this time when you admit me." SOB worsens with laying flat. Denies CP/palpitations, no cough, no abd pain, no N/V/D, no pedal edema, no fevers.    Past Medical History  Diagnosis Date  . Hypertension   . Atrial fibrillation   . Cardiomegaly    Past Surgical History  Procedure Laterality Date  . Neck surgery    . Fracture surgery Right     right arm   Family History  Problem Relation Age of Onset  . Cancer Mother   . Cancer Father    History  Substance Use Topics  . Smoking status: Current Every Day Smoker -- 10 years    Types: Cigars  . Smokeless tobacco: Never Used     Comment: quit cigarette smoking 10 years ago  . Alcohol Use: Yes     Comment: every day 2-3 mixed drinks    Review of Systems ROS: Statement: All systems negative except as marked or noted in the HPI; Constitutional: Negative for fever and chills. ; ; Eyes: Negative for eye pain, redness and discharge. ; ; ENMT: Negative for ear pain, hoarseness, nasal congestion, sinus pressure and sore throat. ; ; Cardiovascular: +SOB. Negative for chest pain, palpitations, diaphoresis, and peripheral edema. ; ; Respiratory: Negative for cough, wheezing and stridor. ; ; Gastrointestinal: Negative for nausea, vomiting, diarrhea, abdominal pain, blood in stool, hematemesis, jaundice and rectal bleeding. . ; ; Genitourinary: Negative for dysuria, flank pain and hematuria. ; ; Musculoskeletal: Negative for back pain and neck pain. Negative for swelling and trauma.; ; Skin: Negative for pruritus, rash, abrasions, blisters, bruising and  skin lesion.; ; Neuro: Negative for headache, lightheadedness and neck stiffness. Negative for weakness, altered level of consciousness , altered mental status, extremity weakness, paresthesias, involuntary movement, seizure and syncope.      Allergies  Review of patient's allergies indicates no known allergies.  Home Medications   Prior to Admission medications   Medication Sig Start Date End Date Taking? Authorizing Provider  amLODipine (NORVASC) 10 MG tablet Take 10 mg by mouth daily.    Historical Provider, MD  lisinopril (PRINIVIL,ZESTRIL) 40 MG tablet Take 40 mg by mouth daily.    Historical Provider, MD  metoprolol tartrate (LOPRESSOR) 25 MG tablet Take 12.5 mg by mouth daily.     Historical Provider, MD  Multiple Vitamin (MULTIVITAMIN WITH MINERALS) TABS tablet Take 1 tablet by mouth daily.    Historical Provider, MD  omega-3 acid ethyl esters (LOVAZA) 1 G capsule Take 1 g by mouth 3 (three) times daily.    Historical Provider, MD  pravastatin (PRAVACHOL) 80 MG tablet Take 80 mg by mouth daily.    Historical Provider, MD  warfarin (COUMADIN) 5 MG tablet Take 5 mg by mouth daily.    Historical Provider, MD   BP 137/66 mmHg  Pulse 55  Temp(Src) 98.7 F (37.1 C) (Oral)  Resp 19  Ht '6\' 2"'$  (1.88 m)  Wt 238 lb (107.956 kg)  BMI 30.54 kg/m2  SpO2 96%  Physical Exam  1220; Physical examination:  Nursing notes reviewed; Vital signs and O2 SAT reviewed;  Constitutional: Well developed, Well nourished, Well hydrated, In no acute distress while sitting on side of stretcher.; Head:  Normocephalic, atraumatic; Eyes: EOMI, PERRL, No scleral icterus; ENMT: Mouth and pharynx normal, Mucous membranes moist; Neck: Supple, Full range of motion, No lymphadenopathy; Cardiovascular: Bradycardic rate and irregular rhythm, No gallop; Respiratory: Breath sounds coarse & equal bilaterally, No wheezes.  Speaking full sentences with ease, Normal respiratory effort/excursion; Chest: Nontender, Movement  normal; Abdomen: Soft, Nontender, Nondistended, Normal bowel sounds; Genitourinary: No CVA tenderness; Extremities: Pulses normal, No tenderness, +tr pedal edema bilat. No calf edema or asymmetry.; Neuro: AA&Ox3, Major CN grossly intact.  Speech clear. No gross focal motor or sensory deficits in extremities.; Skin: Color normal, Warm, Dry.   ED Course  Procedures     EKG Interpretation   Date/Time:  Sunday Jan 14 2015 12:05:35 EDT Ventricular Rate:  61 PR Interval:    QRS Duration: 86 QT Interval:  444 QTC Calculation: 446 R Axis:   79 Text Interpretation:  Atrial fibrillation with slow ventricular response  Anteroseptal infarct , age undetermined ST' \\T'$ \ T wave abnormality,  consider inferolateral ischemia Abnormal ECG When compared with ECG of  01/04/2015 ST-t wave abnormality is less pronounced Confirmed by [facility]   MD, Nunzio Cory [id number]) on 01/14/2015 12:48:10 PM      MDM  MDM Reviewed: previous chart, nursing note and vitals Reviewed previous: labs and ECG Interpretation: labs, ECG and x-ray Total time providing critical care: 30-74 minutes. This excludes time spent performing separately reportable procedures and services. Consults: admitting MD   CRITICAL CARE Performed by: Alfonzo Feller Total critical care time: 35 Critical care time was exclusive of separately billable procedures and treating other patients. Critical care was necessary to treat or prevent imminent or life-threatening deterioration. Critical care was time spent personally by me on the following activities: development of treatment plan with patient and/or surrogate as well as nursing, discussions with consultants, evaluation of patient's response to treatment, examination of patient, obtaining history from patient or surrogate, ordering and performing treatments and interventions, ordering and review of laboratory studies, ordering and review of radiographic studies, pulse oximetry and re-evaluation of  patient's condition.     Results for orders placed or performed during the hospital encounter of 01/14/15  Brain natriuretic peptide  Result Value Ref Range   B Natriuretic Peptide 246.0 (H) 0.0 - 100.0 pg/mL  Troponin I  Result Value Ref Range   Troponin I 0.03 <0.031 ng/mL  CBC with Differential  Result Value Ref Range   WBC 5.3 4.0 - 10.5 K/uL   RBC 5.35 4.22 - 5.81 MIL/uL   Hemoglobin 16.3 13.0 - 17.0 g/dL   HCT 46.2 39.0 - 52.0 %   MCV 86.4 78.0 - 100.0 fL   MCH 30.5 26.0 - 34.0 pg   MCHC 35.3 30.0 - 36.0 g/dL   RDW 13.9 11.5 - 15.5 %   Platelets 148 (L) 150 - 400 K/uL   Neutrophils Relative % 65 43 - 77 %   Neutro Abs 3.4 1.7 - 7.7 K/uL   Lymphocytes Relative 21 12 - 46 %   Lymphs Abs 1.1 0.7 - 4.0 K/uL   Monocytes Relative 14 (H) 3 - 12 %   Monocytes Absolute 0.7 0.1 - 1.0 K/uL   Eosinophils Relative 0 0 - 5 %   Eosinophils Absolute 0.0 0.0 - 0.7 K/uL   Basophils Relative 0 0 -  1 %   Basophils Absolute 0.0 0.0 - 0.1 K/uL  Protime-INR  Result Value Ref Range   Prothrombin Time 14.9 11.6 - 15.2 seconds   INR 1.16 0.00 - 1.49  Comprehensive metabolic panel  Result Value Ref Range   Sodium 126 (L) 135 - 145 mmol/L   Potassium 4.2 3.5 - 5.1 mmol/L   Chloride 93 (L) 101 - 111 mmol/L   CO2 24 22 - 32 mmol/L   Glucose, Bld 116 (H) 65 - 99 mg/dL   BUN 11 6 - 20 mg/dL   Creatinine, Ser 0.78 0.61 - 1.24 mg/dL   Calcium 8.9 8.9 - 10.3 mg/dL   Total Protein 6.7 6.5 - 8.1 g/dL   Albumin 3.8 3.5 - 5.0 g/dL   AST 36 15 - 41 U/L   ALT 27 17 - 63 U/L   Alkaline Phosphatase 56 38 - 126 U/L   Total Bilirubin 0.8 0.3 - 1.2 mg/dL   GFR calc non Af Amer >60 >60 mL/min   GFR calc Af Amer >60 >60 mL/min   Anion gap 9 5 - 15   Dg Chest 2 View 01/14/2015   CLINICAL DATA:  Cough and shortness of breath for 1 month  EXAM: CHEST  2 VIEW  COMPARISON:  Jan 03, 2015  FINDINGS: The heart size and mediastinal contours are stable. The heart size is enlarged. There is mild prominence of  pulmonary interstitium unchanged. There is no focal consolidation, pleural effusion or pneumothorax. The visualized skeletal structures are stable.  IMPRESSION: No active cardiopulmonary disease.  Stable cardiomegaly.   Electronically Signed   By: Abelardo Diesel M.D.   On: 01/14/2015 13:39    1350:  IV lasix given. New hyponatremia on today's labs. VS remain stable. Dx and testing d/w pt and family.  Questions answered.  Verb understanding, agreeable to admit. T/C to [facility] Merlin, case discussed, including:  HPI, pertinent PM/SHx, VS/PE, dx testing, ED course and treatment:  Agreeable to admit, requests they will come to the ED for evaluation.   Francine Graven, DO 01/17/15 1138

## 2015-01-14 NOTE — H&P (Signed)
[facility] History and Physical                                                                                    Kevin Martin, is a 64 y.o. male  MRN: [id number]   DOB - [birth year]  Admit Date - 01/14/2015  Outpatient Primary MD for the patient is No primary care provider on file.  Referring MD: Thurnell Garbe / ER  With History of -  Past Medical History  Diagnosis Date  . Hypertension   . Atrial fibrillation   . Cardiomegaly       Past Surgical History  Procedure Laterality Date  . Neck surgery    . Fracture surgery Right     right arm    in for   Chief Complaint  Patient presents with  . Shortness of Breath     HPI This is a 64 year old male patient with long-standing hypertension. During previous admission in 2013 documented as untreated hypertension. Patient also has a history of atrial fibrillation on Coumadin. He was initially admitted 10 days ago for dyspnea with orthopnea concerning for heart failure exacerbation. Unfortunately on 5/5 before evaluation could be completed and before patient could be stabilized from a respiratory standpoint he left AMA stating his daughter was in a motor vehicle crash in [place] and had to leave. Patient returned to the ER today for persistent shortness of breath; this has been associated with orthopnea and nocturnal dyspnea noting patient unable to sleep. He denied lower extremity edema, weight gain or dyspnea on exertion. He has been reporting nasal congestion with associated fevers and chills for several days. This past Friday he had chest pressure in the center of his chest that lasted all day. This pain did not radiate. It was not associated with diaphoresis or change in his shortness of breath. No apparent change in chest pain with general mobilization. According to his family at the bedside patient snores very heavily.  In the ER patient is afebrile, pulse is controlled at 55 bpm and he is in nature fibrillation, blood  pressures 137/66. He is maintaining room air saturations at 96%. Patient's sodium was down to 126 noting sodium was 138 on 5/5. Otherwise the panel unremarkable. Glucose slightly elevated at 116. BNP 246. Initial troponin 0.03. EKG stable with a fibrillation slow ventricular response with inverted T waves in inferior lateral leads similar to EKG last admission. Unfortunately no prior EKGs for comparison. Please note during last hospitalization patient's enzymes remained negative. CBC was normal except for mildly decreased platelets of 148,000 and elevated monocytes of 14. INR subtherapeutic at 1.16. Chest x-ray with findings consistent with COPD but no other acute cardiopulmonary findings.   Review of Systems   In addition to the HPI above,  No myalgias or other constitutional symptoms No Headache, changes with Vision or hearing, new weakness, tingling, numbness in any extremity, No problems swallowing food or Liquids, indigestion/reflux No Abdominal pain, N/V; no melena or hematochezia, no dark tarry stools, Bowel movements are regular, No dysuria, hematuria or flank pain No new skin rashes, lesions, masses or bruises, No new joints pains-aches No recent weight gain or loss No polyuria,  polydypsia or polyphagia,  *A full 10 point Review of Systems was done, except as stated above, all other Review of Systems were negative.  Social History History  Substance Use Topics  . Smoking status: Current Every Day Smoker -- 10 years    Types: Cigars  . Smokeless tobacco: Never Used     Comment: quit cigarette smoking 10 years ago  . Alcohol Use: Yes     Comment: Consumes up to 6 mixed drinks per day     Resides at: Private residence  Lives with: Alone-patient is a widower for at least 10 years  Ambulatory status: Ambulatory without assistive devices prior to admission   Family History Family History  Problem Relation Age of Onset  . Cancer Mother   . Cancer Father      Prior to  Admission medications   Medication Sig Start Date End Date Taking? Authorizing Provider  amLODipine (NORVASC) 10 MG tablet Take 10 mg by mouth daily.   Yes Historical Provider, MD  furosemide (LASIX) 40 MG tablet Take 40 mg by mouth daily.   Yes Historical Provider, MD  lisinopril (PRINIVIL,ZESTRIL) 40 MG tablet Take 40 mg by mouth daily.   Yes Historical Provider, MD  metoprolol tartrate (LOPRESSOR) 25 MG tablet Take 12.5 mg by mouth daily.    Yes Historical Provider, MD  Multiple Vitamin (MULTIVITAMIN WITH MINERALS) TABS tablet Take 1 tablet by mouth daily.   Yes Historical Provider, MD  omega-3 acid ethyl esters (LOVAZA) 1 G capsule Take 1 g by mouth 3 (three) times daily.   Yes Historical Provider, MD  pravastatin (PRAVACHOL) 80 MG tablet Take 80 mg by mouth daily.   Yes Historical Provider, MD  warfarin (COUMADIN) 5 MG tablet Take 2.5-5 mg by mouth See admin instructions. Patient takes 2.5 mg on Monday Wednesday Friday. All other days patient takes 5 mg   Yes Historical Provider, MD    No Known Allergies  Physical Exam  Vitals  Blood pressure 123/73, pulse 60, temperature 98.7 F (37.1 C), temperature source Oral, resp. rate 19, height '6\' 2"'$  (1.88 m), weight 238 lb (107.956 kg), SpO2 97 %.   General:  In mild acute respiratory distress evidenced by inability to lay flat on stretcher, appears healthy and well nourished  Psych:  Normal affect, Denies Suicidal or Homicidal ideations, Awake Alert, Oriented X 3. Speech and thought patterns are clear and appropriate, no apparent short term memory deficits  Neuro:   No focal neurological deficits, CN II through XII intact, Strength 5/5 all 4 extremities, Sensation intact all 4 extremities.  ENT:  Ears and Eyes appear Normal, Conjunctivae clear, PER. Moist oral mucosa without erythema or exudates. Prominent nasal congestion and clear rhinorrhea  Neck:  Supple, No lymphadenopathy appreciated  Respiratory:  Symmetrical chest wall movement,  Good air movement bilaterally, coarse to auscultation with scattered expiratory crackles mainly in the right mid fields to bases Room Air  Cardiac: Irregular-atrial fibrillation, harsh pansystolic murmur left sternal border directly adjacent to sternum at second intercostal space- grade 4/6, no LE edema noted, no JVD, No carotid bruits, peripheral pulses palpable at 2+  Abdomen:  Positive bowel sounds, Soft, Non tender, Non distended,  No masses appreciated, no obvious hepatosplenomegaly  Skin:  No Cyanosis, Normal Skin Turgor, No Skin Rash or Bruise.  Extremities: Symmetrical without obvious trauma or injury although right leg appears somewhat larger than left leg below the knee,  no effusions.  Data Review  CBC  Recent Labs Lab 01/14/15 1239  WBC 5.3  HGB 16.3  HCT 46.2  PLT 148*  MCV 86.4  MCH 30.5  MCHC 35.3  RDW 13.9  LYMPHSABS 1.1  MONOABS 0.7  EOSABS 0.0  BASOSABS 0.0    Chemistries   Recent Labs Lab 01/14/15 1239  NA 126*  K 4.2  CL 93*  CO2 24  GLUCOSE 116*  BUN 11  CREATININE 0.78  CALCIUM 8.9  AST 36  ALT 27  ALKPHOS 56  BILITOT 0.8    estimated creatinine clearance is 122 mL/min (by C-G formula based on Cr of 0.78).  No results for input(s): TSH, T4TOTAL, T3FREE, THYROIDAB in the last 72 hours.  Invalid input(s): FREET3  Coagulation profile  Recent Labs Lab 01/14/15 1239  INR 1.16    No results for input(s): DDIMER in the last 72 hours.  Cardiac Enzymes  Recent Labs Lab 01/14/15 1239  TROPONINI 0.03    Invalid input(s): POCBNP  Urinalysis No results found for: COLORURINE, APPEARANCEUR, LABSPEC, PHURINE, GLUCOSEU, HGBUR, BILIRUBINUR, KETONESUR, PROTEINUR, UROBILINOGEN, NITRITE, LEUKOCYTESUR  Imaging results:   Dg Chest 2 View  01/14/2015   CLINICAL DATA:  Cough and shortness of breath for 1 month  EXAM: CHEST  2 VIEW  COMPARISON:  Jan 03, 2015  FINDINGS: The heart size and mediastinal contours are stable. The heart size is  enlarged. There is mild prominence of pulmonary interstitium unchanged. There is no focal consolidation, pleural effusion or pneumothorax. The visualized skeletal structures are stable.  IMPRESSION: No active cardiopulmonary disease.  Stable cardiomegaly.   Electronically Signed   By: Abelardo Diesel M.D.   On: 01/14/2015 13:39   Dg Chest 2 View  01/03/2015   CLINICAL DATA:  Smoker.  Elevated white blood cell count.  EXAM: CHEST  2 VIEW  COMPARISON:  PA and lateral chest 09/08/2005.  FINDINGS: Mild prominence of the pulmonary interstitium is seen. No consolidative process, pneumothorax or effusion is identified. There is cardiomegaly. No focal bony abnormality is identified.  IMPRESSION: Cardiomegaly without acute disease.   Electronically Signed   By: Inge Rise M.D.   On: 01/03/2015 19:00     EKG: (Independently reviewed) H her fibrillation with controlled ventricular response, QTC 446 ms, inverted T waves in anterolateral leads similar to previous EKG   Assessment & Plan  Principal Problem:   Acute respiratory failure:   A) Right heart failure   B) Acute congestive heart failure with left ventricular diastolic dysfunction -Admit to telemetry -Echocardiogram completed last admission showed preserved LV function with an EF of 55-60% with biatrial dilatation as well as mild right ventricular dilatation in setting of moderate concentric hypertrophy and moderate aortic stenosis -Given the fact he has no peripheral edema and no abnormal chest x-ray findings suspect patient has right-sided heart failure and likely degree of diastolic dysfunction -Given body habitus and right ventricular remodeling suspect patient may have undiagnosed sleep apnea -Was on oral Lasix at home; will begin Lasix 60 mg IV every 12 hours with 40 mEq potassium daily -Daily weights and strict intake and output -Patient reports compliance with medications but it is noted that his INR subtherapeutic so suspect patient not  compliant as he reports -Suspect likely dietary indiscretion -Patient also reports heavy daily alcohol use which is also likely contributing to current symptoms -Since heart failure not related to LV dysfunction okay to continue beta blocker -Continue ACE inhibitor -Patient has agreed to stay in the hospital until symptoms are appropriately managed -Right lower extremity appears slightly larger than left  leg/precaution we'll check venous duplex study  Active Problems:   Atypical chest pain -Likely from heart failure but given the subtle EKG changes we'll cycle cardiac enzymes -May benefit from outpatient cardiology evaluation if enzymes remain negative -Suspect T-wave inversion more reflective of concentric hypertrophy -On beta blocker prior to admission -Add low-dose aspirin   Upper respiratory infection -Likely simple viral syndrome but will check for influenza and check a respiratory viral panel    Acute hyponatremia -Secondary to volume overload -Follow electrolytes    Atrial fibrillation, controlled -Continue rate controlling agents    Moderate aortic stenosis -New diagnosis for this patient and needs to be monitored closely for progression -Has developed some mild RV dilatation no apparent pulmonary hypertension and no tricuspid regurgitation    Chronic anticoagulation -INR subtherapeutic at presentation -Pharmacy to manage    Hypertension -Current blood pressure controlled -Continue above medications -Continue Norvasc   Hyperglycemia -Check hemoglobin A1c    Alcohol abuse, daily use -Patient admits to up to 6 mixed drinks with liquor daily -Patient and daughters in room confirmed that patient has not had prior alcohol withdrawal symptoms but clarify this has been on a no more than 2 day abstinence -I have ordered a medical surgical CIWA    DVT Prophylaxis: Warfarin  Family Communication:   Daughters at bedside  Code Status: Full code   Condition:   Stable  Discharge disposition: Anticipate will return back to home once heart failure compensated  Time spent in minutes : 60      Haja Crego L. ANP on 01/14/2015 at 2:25 PM  Between 7am to 7pm - Pager - [phone]  After 7pm go to [email or web address] - password [email or web address]  And look for the night coverage person covering me after hours  [facility]

## 2015-01-14 NOTE — ED Notes (Signed)
Attempted report 

## 2015-01-14 NOTE — ED Notes (Signed)
Patient transported to X-ray 

## 2015-01-14 NOTE — Progress Notes (Signed)
1630pm admitted to 3e 10. Kept comfortable in bed . Family in room . Reinforced r/t flu protocol  to pt and famil

## 2015-01-14 NOTE — ED Notes (Signed)
Patient returned from X-ray 

## 2015-01-15 ENCOUNTER — Other Ambulatory Visit: Payer: Self-pay | Admitting: Physician Assistant

## 2015-01-15 ENCOUNTER — Ambulatory Visit (HOSPITAL_COMMUNITY): Payer: Self-pay

## 2015-01-15 ENCOUNTER — Encounter (HOSPITAL_COMMUNITY): Payer: Self-pay | Admitting: General Practice

## 2015-01-15 DIAGNOSIS — F1010 Alcohol abuse, uncomplicated: Secondary | ICD-10-CM

## 2015-01-15 DIAGNOSIS — I10 Essential (primary) hypertension: Secondary | ICD-10-CM

## 2015-01-15 DIAGNOSIS — I5031 Acute diastolic (congestive) heart failure: Secondary | ICD-10-CM

## 2015-01-15 DIAGNOSIS — I5033 Acute on chronic diastolic (congestive) heart failure: Secondary | ICD-10-CM

## 2015-01-15 DIAGNOSIS — I509 Heart failure, unspecified: Secondary | ICD-10-CM

## 2015-01-15 DIAGNOSIS — E871 Hypo-osmolality and hyponatremia: Secondary | ICD-10-CM

## 2015-01-15 LAB — GLUCOSE, CAPILLARY: Glucose-Capillary: 134 mg/dL — ABNORMAL HIGH (ref 65–99)

## 2015-01-15 LAB — BASIC METABOLIC PANEL
Anion gap: 9 (ref 5–15)
BUN: 11 mg/dL (ref 6–20)
CHLORIDE: 92 mmol/L — AB (ref 101–111)
CO2: 28 mmol/L (ref 22–32)
CREATININE: 0.94 mg/dL (ref 0.61–1.24)
Calcium: 8.8 mg/dL — ABNORMAL LOW (ref 8.9–10.3)
GLUCOSE: 121 mg/dL — AB (ref 65–99)
POTASSIUM: 4.5 mmol/L (ref 3.5–5.1)
SODIUM: 129 mmol/L — AB (ref 135–145)

## 2015-01-15 LAB — PROTIME-INR
INR: 1.15 (ref 0.00–1.49)
PROTHROMBIN TIME: 14.8 s (ref 11.6–15.2)

## 2015-01-15 LAB — TROPONIN I
TROPONIN I: 0.03 ng/mL (ref <0.031)
TROPONIN I: 0.03 ng/mL (ref <0.031)

## 2015-01-15 MED ORDER — FUROSEMIDE 40 MG PO TABS
40.0000 mg | ORAL_TABLET | Freq: Every day | ORAL | Status: DC
  Administered 2015-01-16: 40 mg via ORAL
  Filled 2015-01-15: qty 1

## 2015-01-15 MED ORDER — WARFARIN SODIUM 7.5 MG PO TABS
7.5000 mg | ORAL_TABLET | Freq: Once | ORAL | Status: AC
  Administered 2015-01-15: 7.5 mg via ORAL
  Filled 2015-01-15: qty 1

## 2015-01-15 MED ORDER — FUROSEMIDE 10 MG/ML IJ SOLN: 40.0000 mg | Freq: Two times a day (BID) | INTRAMUSCULAR | Status: DC

## 2015-01-15 NOTE — Consult Note (Addendum)
CARDIOLOGY CONSULT NOTE   Patient ID: Kevin Martin MRN: [id number], DOB/AGE: 64-Nov-1952   Admit date: 01/14/2015 Date of Consult: 01/15/2015   Primary Physician: Berkley Harvey [id number] Primary Cardiologist: New  [name] is a 64 y.o. male with hx of chronic a fib on anticoag, HTN, alcohol use, and cigar use. He presents with 2 week history of worsening SOB, especially when lying flat.  He was seen by his PCP on 01/03/15 with CXR and elevated BMP worrisome for CHF and was sent to the ER. He was admitted, but left AMA. He returned to [facility] 01/14/15 with continued symptoms.  Problem List  Past Medical History  Diagnosis Date  . Hypertension   . Atrial fibrillation   . Cardiomegaly     Past Surgical History  Procedure Laterality Date  . Neck surgery    . Fracture surgery Right     right arm     Allergies  No Known Allergies  HPI   Kevin Martin states that he begun to experience significant SOB on Sunday, 12/31/14, and reports he was at his baseline prior to that.  He played a round of golf that day with no difficulty, but it was upon lying down for bed that evening he experienced a "suffocating" sensation when lying flat.  As above, this continued and he saw his PCP and was sent to the ER on 01/03/15.  Echo on 01/04/15 showed EF of 55-60% without wall motion abnormalities.  Pt left AMA from the hospital on that day.     He continued to have symptoms and returned to the [facility] for evaluation on 01/14/15.  He denies CP or SOB on exertion, states only becomes SOB when lying flat.  He is unsure if he has had PND, as he has had very little sleep 2/2 orthopnea.  He denies cough, fever, or chills.  Reports small amount of peripheral edema but denies any at this time.  He does not think he has ever had a cardiac catheterization, and denies any personal or family history of MI or stroke.  Pt states he was only on '10mg'$  Lasix once daily prior to these symptoms starting.  Inpatient  Medications  . amLODipine  10 mg Oral Daily  . aspirin EC  81 mg Oral Daily  . folic acid  1 mg Oral Daily  . furosemide  40 mg Intravenous Q12H  . lisinopril  40 mg Oral Daily  . LORazepam  0-4 mg Oral Q6H   Followed by  . [START ON 01/16/2015] LORazepam  0-4 mg Oral Q12H  . metoprolol tartrate  12.5 mg Oral Daily  . multivitamin with minerals  1 tablet Oral Daily  . omega-3 acid ethyl esters  1 g Oral TID  . potassium chloride  40 mEq Oral Daily  . pravastatin  80 mg Oral Daily  . sodium chloride  3 mL Intravenous Q12H  . thiamine  100 mg Oral Daily  . warfarin  7.5 mg Oral ONCE-1800  . Warfarin - Pharmacist Dosing Inpatient   Does not apply q1800    Family History Family History  Problem Relation Age of Onset  . Cancer Mother   . Cancer Father      Social History History   Social History  . Marital Status: Divorced    Spouse Name: N/A  . Number of Children: N/A  . Years of Education: N/A   Occupational History  . Not on file.   Social  History Main Topics  . Smoking status: Current Every Day Smoker -- 10 years    Types: Cigars  . Smokeless tobacco: Never Used     Comment: quit cigarette smoking 10 years ago  . Alcohol Use: Yes     Comment: every day 2-3 mixed drinks  . Drug Use: No  . Sexual Activity: Not on file   Other Topics Concern  . Not on file   Social History Narrative     Review of Systems  General:  No chills, fever, night sweats or weight changes.  Cardiovascular:  No chest pain, dyspnea on exertion, edema, palpitations. +orthopnea Dermatological: No rash, lesions/masses Respiratory: No cough +dyspnea Urologic: No hematuria, dysuria Abdominal:   No nausea, vomiting, diarrhea, bright red blood per rectum, melena, or hematemesis +increased abdominal girth Neurologic:  No visual changes, wkns, changes in mental status. All other systems reviewed and are otherwise negative except as noted above.  Physical Exam  Blood pressure 138/65, pulse  62, temperature 98.2 F (36.8 C), temperature source Oral, resp. rate 22, height '6\' 2"'$  (1.88 m), weight 229 lb 1.6 oz (103.919 kg), SpO2 98 %.  General: Pleasant, NAD Psych: Normal affect. Neuro: Alert and oriented X 3. Moves all extremities spontaneously. HEENT: Normal  Neck: Supple without bruits or JVD. Lungs:  Resp regular and unlabored, CTA. Heart: RRR no s3, s4. II/VI SEM heard best at aortic area. Abdomen: Soft, non-tender, non-distended, BS + x 4.  Extremities: No clubbing, cyanosis or edema. DP/PT/Radials 2+ and equal bilaterally.  Labs   Recent Labs  01/14/15 1239 01/14/15 1830 01/15/15 0040 01/15/15 0635  TROPONINI 0.03 0.03 0.03 0.03   Lab Results  Component Value Date   WBC 5.3 01/14/2015   HGB 16.3 01/14/2015   HCT 46.2 01/14/2015   MCV 86.4 01/14/2015   PLT 148* 01/14/2015    Recent Labs Lab 01/14/15 1239 01/15/15 0635  NA 126* 129*  K 4.2 4.5  CL 93* 92*  CO2 24 28  BUN 11 11  CREATININE 0.78 0.94  CALCIUM 8.9 8.8*  PROT 6.7  --   BILITOT 0.8  --   ALKPHOS 56  --   ALT 27  --   AST 36  --   GLUCOSE 116* 121*   Radiology/Studies  Dg Chest 2 View  01/14/2015   CLINICAL DATA:  Cough and shortness of breath for 1 month  EXAM: CHEST  2 VIEW  COMPARISON:  Jan 03, 2015  FINDINGS: The heart size and mediastinal contours are stable. The heart size is enlarged. There is mild prominence of pulmonary interstitium unchanged. There is no focal consolidation, pleural effusion or pneumothorax. The visualized skeletal structures are stable.  IMPRESSION: No active cardiopulmonary disease.  Stable cardiomegaly.   Electronically Signed   By: Abelardo Diesel M.D.   On: 01/14/2015 13:39   Dg Chest 2 View  01/03/2015   CLINICAL DATA:  Smoker.  Elevated white blood cell count.  EXAM: CHEST  2 VIEW  COMPARISON:  PA and lateral chest 09/08/2005.  FINDINGS: Mild prominence of the pulmonary interstitium is seen. No consolidative process, pneumothorax or effusion is identified.  There is cardiomegaly. No focal bony abnormality is identified.  IMPRESSION: Cardiomegaly without acute disease.   Electronically Signed   By: Inge Rise M.D.   On: 01/03/2015 19:00   Echo 01/04/15 Study Conclusions - Left ventricle: The cavity size was normal. There was moderate concentric hypertrophy. Systolic function was normal. The estimated ejection fraction was in the range  of 55% to 60%. Wall motion was normal; there were no regional wall motion abnormalities. - Aortic valve: Valve mobility was mildly restricted. There was moderate stenosis. There was mild regurgitation. - Aortic root: The aortic root was mildly dilated. - Mitral valve: There was mild regurgitation. - Left atrium: The atrium was severely dilated. - Right ventricle: The cavity size was mildly dilated. Wall thickness was normal. - Right atrium: The atrium was moderately dilated. - Pericardium, extracardiac: A trivial pericardial effusion was identified.  TELE  A fib in 50s-60s  ECG Afib at a rate of 61bpm.  Mild ST depression with TWI in aVF, V5, V6. Less pronounced than EKG 2015/01/07  ASSESSMENT AND PLAN  1. Acute on chronic diastolic CHF  -Echo 01/01/78 shows EF 55-60% with mild RV dilation and moderate AS.  - -2500 I&Os since admit, reported weight down 7 lbs.  -change to PO lasix '40mg'$  daily. Given TW changes on previous EKG, likely need outpatient myoview. No obvious angina symptoms recently.  - well diuresed, currently at his baseline. Discussed with patient regarding Na restriction, fluid restriction, daily weight. Stable from cardiac standpoint for discharge  2. Acute respiratory failure  -Likely in setting of #1 given rapid improvement with diuresis.  -Currently 98-100% on room air.  3. Atrial fibrillation, controlled  - Rate control not a problem with HRs in the 50s-60s  - On warfarin. On 12.'5mg'$  lopressor daily, will not uptitrate given baseline bradycardia and well rate controlled  a-fib  4. Hypertension  -Moderately controlled with SBPs consistently > 130  -Cont lisinopril, lopressor, and amlodipine  5. Moderate AS  Will need to be followed over time with echoes  See in clinic    6  EtOH use  Patient drinks 2 to 3 mixed drinks per day  Counselled on pulling back as he is on coumadin    Hilbert Corrigan, PA-C 01/15/2015, 1:44 PM   Patient seen and examined. Agree with findings of H Meng above  Patient presented with diasotlic CHF  Chronic Afib  HRs low. He has diuresed nicely  Breathing is good. Lungs CTA  Cardiac Irreg Irreg  Ext  No edema   I think he is OK for d/c   Cna prob d/c b blocker as HR is low    Will make sure that he has outpt f/u    Dorris Carnes

## 2015-01-15 NOTE — Progress Notes (Addendum)
Patient Demographics  Kevin Martin, is a 64 y.o. male, DOB - Aug 26, 1951, LXB:[id number]  Admit date - 01/14/2015   Admitting Physician Orson Eva, MD  Outpatient Primary MD for the patient is No primary care provider on file.  LOS - 1   Chief Complaint  Patient presents with  . Shortness of Breath       Admission HPI/Brief narrative:  Subjective:   Kevin Martin today has, No headache, No chest pain, No abdominal pain - No Nausea, No new weakness tingling or numbness, No Cough - SOB.   Assessment & Plan    Principal Problem:   Acute respiratory failure Active Problems:   Right heart failure   Atrial fibrillation, controlled   Moderate aortic stenosis   Chronic anticoagulation   Hypertension   Acute congestive heart failure with left ventricular diastolic dysfunction   Alcohol abuse, daily use   Acute hyponatremia   Acute right heart failure  Acute on chronic diastolic CHF - Most recent echo 01/04/15 showing EF 55-60%, and mild right ventricular dilatation with moderate concentric hypertrophy and moderate aortic stenosis. Chest x-ray showing evidence of volume overload on admission. - Most likely in the setting of noncompliance with medication and diet. - Continue with daily weights, strict ins and outs - Continue with IV diuresis - Follow on venous Doppler for lower extremity edema  Hyponatremia - Continue to volume overload, improving with diuresis  Atrial fibrillation - Heart rate controlled on metoprolol, on warfarin, INR is subtherapeutic, managed by pharmacy.  Alcohol abuse - Continue with CIWA protocol.  Hypertension - Blood pressure acceptable, continue with home medication.  Hyperglycemia - Follow on hemoglobin A1c  Chest pain - nontypical, no recurrence since admission. - Troponins negative 4.  Code Status: Full  Family Communication: None at bedside  Disposition  Plan: Home in 1-2 days   Procedures  None   Consults   None   Medications  Scheduled Meds: . amLODipine  10 mg Oral Daily  . aspirin EC  81 mg Oral Daily  . folic acid  1 mg Oral Daily  . furosemide  60 mg Intravenous Q12H  . lisinopril  40 mg Oral Daily  . LORazepam  0-4 mg Oral Q6H   Followed by  . [START ON 01/16/2015] LORazepam  0-4 mg Oral Q12H  . metoprolol tartrate  12.5 mg Oral Daily  . multivitamin with minerals  1 tablet Oral Daily  . omega-3 acid ethyl esters  1 g Oral TID  . potassium chloride  40 mEq Oral Daily  . pravastatin  80 mg Oral Daily  . sodium chloride  3 mL Intravenous Q12H  . thiamine  100 mg Oral Daily  . warfarin  7.5 mg Oral ONCE-1800  . Warfarin - Pharmacist Dosing Inpatient   Does not apply q1800   Continuous Infusions:  PRN Meds:.sodium chloride, acetaminophen, LORazepam **OR** LORazepam, ondansetron (ZOFRAN) IV, sodium chloride  DVT Prophylaxis  on warfarin  Lab Results  Component Value Date   PLT 148* 01/14/2015    Antibiotics    Anti-infectives    None          Objective:   Filed Vitals:   01/14/15 1627 01/14/15 2049 01/14/15 2134 01/15/15 0213  BP: 134/74 115/84  132/64 138/65  Pulse: 64  62 58  Temp: 98.7 F (37.1 C)  98.4 F (36.9 C) 98.2 F (36.8 C)  TempSrc: Oral  Oral Oral  Resp: '18  18 22  '$ Height: '6\' 2"'$  (1.88 m)     Weight: 103.919 kg (229 lb 1.6 oz)     SpO2: 99%  100% 98%    Wt Readings from Last 3 Encounters:  01/14/15 103.919 kg (229 lb 1.6 oz)  01/04/15 105.8 kg (233 lb 4 oz)     Intake/Output Summary (Last 24 hours) at 01/15/15 1153 Last data filed at 01/15/15 0946  Gross per 24 hour  Intake   1180 ml  Output   3700 ml  Net  -2520 ml     Physical Exam  Awake Alert, Oriented X 3, No new F.N deficits, Normal affect Julian.AT,PERRAL Supple Neck,No JVD, No cervical lymphadenopathy appriciated.  Symmetrical Chest wall movement, Good air movement bilaterally, Irregular, No Gallops,Rubs, has  SEM, No Parasternal Heave +ve B.Sounds, Abd Soft, No tenderness, No organomegaly appriciated, No rebound - guarding or rigidity. No Cyanosis, Clubbing , no edema bilaterally, No new Rash or bruise    Data Review   Micro Results No results found for this or any previous visit (from the past 240 hour(s)).  Radiology Reports Dg Chest 2 View  01/14/2015   CLINICAL DATA:  Cough and shortness of breath for 1 month  EXAM: CHEST  2 VIEW  COMPARISON:  Jan 03, 2015  FINDINGS: The heart size and mediastinal contours are stable. The heart size is enlarged. There is mild prominence of pulmonary interstitium unchanged. There is no focal consolidation, pleural effusion or pneumothorax. The visualized skeletal structures are stable.  IMPRESSION: No active cardiopulmonary disease.  Stable cardiomegaly.   Electronically Signed   By: Abelardo Diesel M.D.   On: 01/14/2015 13:39   Dg Chest 2 View  01/03/2015   CLINICAL DATA:  Smoker.  Elevated white blood cell count.  EXAM: CHEST  2 VIEW  COMPARISON:  PA and lateral chest 09/08/2005.  FINDINGS: Mild prominence of the pulmonary interstitium is seen. No consolidative process, pneumothorax or effusion is identified. There is cardiomegaly. No focal bony abnormality is identified.  IMPRESSION: Cardiomegaly without acute disease.   Electronically Signed   By: Inge Rise M.D.   On: 01/03/2015 19:00     CBC  Recent Labs Lab 01/14/15 1239  WBC 5.3  HGB 16.3  HCT 46.2  PLT 148*  MCV 86.4  MCH 30.5  MCHC 35.3  RDW 13.9  LYMPHSABS 1.1  MONOABS 0.7  EOSABS 0.0  BASOSABS 0.0    Chemistries   Recent Labs Lab 01/14/15 1239 01/15/15 0635  NA 126* 129*  K 4.2 4.5  CL 93* 92*  CO2 24 28  GLUCOSE 116* 121*  BUN 11 11  CREATININE 0.78 0.94  CALCIUM 8.9 8.8*  AST 36  --   ALT 27  --   ALKPHOS 56  --   BILITOT 0.8  --    ------------------------------------------------------------------------------------------------------------------ estimated  creatinine clearance is 102.1 mL/min (by C-G formula based on Cr of 0.94). ------------------------------------------------------------------------------------------------------------------ No results for input(s): HGBA1C in the last 72 hours. ------------------------------------------------------------------------------------------------------------------ No results for input(s): CHOL, HDL, LDLCALC, TRIG, CHOLHDL, LDLDIRECT in the last 72 hours. ------------------------------------------------------------------------------------------------------------------ No results for input(s): TSH, T4TOTAL, T3FREE, THYROIDAB in the last 72 hours.  Invalid input(s): FREET3 ------------------------------------------------------------------------------------------------------------------ No results for input(s): VITAMINB12, FOLATE, FERRITIN, TIBC, IRON, RETICCTPCT in the last 72 hours.  Coagulation profile  Recent  Labs Lab 01/14/15 1239 01/15/15 0635  INR 1.16 1.15    No results for input(s): DDIMER in the last 72 hours.  Cardiac Enzymes  Recent Labs Lab 01/14/15 1830 01/15/15 0040 01/15/15 0635  TROPONINI 0.03 0.03 0.03   ------------------------------------------------------------------------------------------------------------------ Invalid input(s): POCBNP     Time Spent in minutes   30 minutes   Ousmane Seeman M.D on 01/15/2015 at 11:53 AM  Between 7am to 7pm - Pager - [phone]  After 7pm go to [email or web address] - password [email or web address]  [facility]   Office  [phone]

## 2015-01-15 NOTE — Progress Notes (Signed)
ANTICOAGULATION CONSULT NOTE - Follow-up  Pharmacy Consult for Warfarin  Indication: atrial fibrillation  No Known Allergies  Patient Measurements: Height: '6\' 2"'$  (188 cm) Weight: 229 lb 1.6 oz (103.919 kg) (scale A) IBW/kg (Calculated) : 82.2   Vital Signs: Temp: 98.2 F (36.8 C) (05/16 0213) Temp Source: Oral (05/16 0213) BP: 138/65 mmHg (05/16 0213) Pulse Rate: 58 (05/16 0213)  Labs:  Recent Labs  01/14/15 1239 01/14/15 1830 01/15/15 0040 01/15/15 0635  HGB 16.3  --   --   --   HCT 46.2  --   --   --   PLT 148*  --   --   --   LABPROT 14.9  --   --  14.8  INR 1.16  --   --  1.15  CREATININE 0.78  --   --  0.94  TROPONINI 0.03 0.03 0.03 0.03    Estimated Creatinine Clearance: 102.1 mL/min (by C-G formula based on Cr of 0.94).  Assessment: 48 YOM with gradual onset and worsening of SOB for the past 2 weeks. He is on Coumadin at home for h/o of Afib. He takes Coumadin 2.5 mg on M/W/F and 5 mg on all other days. His last dose was Coumadin was yesterday. Pharmacy consulted to resume therapy. INR remains subtherapeutic at 1.15. No bleeding noted.   Goal of Therapy:  INR 2-3 Monitor platelets by anticoagulation protocol: Yes   Plan:  - Repeat warfarin 7.'5mg'$  PO x 1 tonight - Daily INR  Salome Arnt, PharmD, BCPS Pager # [phone] 01/15/2015 10:38 AM

## 2015-01-15 NOTE — Care Management Note (Signed)
Case Management Note  Patient Details  Name: Kevin Martin MRN: [id number] Date of Birth: [birth year]  Subjective/Objective:    Pt admitted on 01/14/15 with acute respiratory failure secondary to CHF.  PTA, pt resides at home with girlfriend.  Current ETOH use.             Action/Plan:  CSW consulted for ETOH counseling.  Will cont to follow for discharge planning as pt progresses.         Expected Discharge Date:                  Expected Discharge Plan:     In-House Referral:   CM referral, CSW referral  Discharge planning Services     Post Acute Care Choice:    Choice offered to:     DME Arranged:    DME Agency:     HH Arranged:    HH Agency:     Status of Service:   in process, cont to follow  Medicare Important Message Given:    Date Medicare IM Given:    Medicare IM give by:    Date Additional Medicare IM Given:    Additional Medicare Important Message give by:     If discussed at [place] of Stay Meetings, dates discussed:    Additional Comments:  Ella Bodo, RN 01/15/2015, 3:05 PM Phone [phone]

## 2015-01-15 NOTE — Progress Notes (Signed)
Pt moderate falls risk (score of 7). Pt alert and oriented x4, steady on feet. Pt independent at home. Pt has falls risk bracelet on and non-slip socks. Pt states he is constantly moving from chair to bed and to bathroom and bed alarm unable to be put on. Pt encouraged to call RN for assistance to bathroom. Pt verbalized understanding. Frequent rounding on patient in place.

## 2015-01-15 NOTE — Discharge Instructions (Addendum)

## 2015-01-16 ENCOUNTER — Ambulatory Visit (HOSPITAL_COMMUNITY): Payer: Self-pay

## 2015-01-16 DIAGNOSIS — M7989 Other specified soft tissue disorders: Secondary | ICD-10-CM

## 2015-01-16 DIAGNOSIS — I4891 Unspecified atrial fibrillation: Secondary | ICD-10-CM

## 2015-01-16 DIAGNOSIS — I5033 Acute on chronic diastolic (congestive) heart failure: Principal | ICD-10-CM

## 2015-01-16 DIAGNOSIS — J9600 Acute respiratory failure, unspecified whether with hypoxia or hypercapnia: Secondary | ICD-10-CM

## 2015-01-16 LAB — CBC
HEMATOCRIT: 46.8 % (ref 39.0–52.0)
Hemoglobin: 16.5 g/dL (ref 13.0–17.0)
MCH: 31 pg (ref 26.0–34.0)
MCHC: 35.3 g/dL (ref 30.0–36.0)
MCV: 88 fL (ref 78.0–100.0)
Platelets: 150 10*3/uL (ref 150–400)
RBC: 5.32 MIL/uL (ref 4.22–5.81)
RDW: 14 % (ref 11.5–15.5)
WBC: 6.9 10*3/uL (ref 4.0–10.5)

## 2015-01-16 LAB — RESPIRATORY VIRUS PANEL
Adenovirus: NEGATIVE
Influenza A: NEGATIVE
Influenza B: POSITIVE — AB
Metapneumovirus: NEGATIVE
PARAINFLUENZA 1 A: NEGATIVE
PARAINFLUENZA 2 A: NEGATIVE
Parainfluenza 3: NEGATIVE
RHINOVIRUS: NEGATIVE
Respiratory Syncytial Virus A: NEGATIVE
Respiratory Syncytial Virus B: NEGATIVE

## 2015-01-16 LAB — HEMOGLOBIN A1C
Hgb A1c MFr Bld: 6.2 % — ABNORMAL HIGH (ref 4.8–5.6)
Mean Plasma Glucose: 131 mg/dL

## 2015-01-16 LAB — URINE DRUGS OF ABUSE SCREEN W ALC, ROUTINE (REF LAB)
AMPHETAMINES, URINE: NEGATIVE ng/mL
BENZODIAZEPINE QUANT UR: NEGATIVE ng/mL
Barbiturate, Ur: NEGATIVE ng/mL
CANNABINOID QUANT UR: NEGATIVE ng/mL
Cocaine (Metab.): NEGATIVE ng/mL
Ethanol U, Quan: NEGATIVE %
METHADONE SCREEN, URINE: NEGATIVE ng/mL
Opiate Quant, Ur: NEGATIVE ng/mL
Phencyclidine, Ur: NEGATIVE ng/mL
Propoxyphene, Urine: NEGATIVE ng/mL

## 2015-01-16 LAB — BASIC METABOLIC PANEL
Anion gap: 10 (ref 5–15)
BUN: 12 mg/dL (ref 6–20)
CO2: 29 mmol/L (ref 22–32)
Calcium: 8.8 mg/dL — ABNORMAL LOW (ref 8.9–10.3)
Chloride: 94 mmol/L — ABNORMAL LOW (ref 101–111)
Creatinine, Ser: 0.88 mg/dL (ref 0.61–1.24)
GFR calc Af Amer: >60 mL/min (ref >60)
GLUCOSE: 107 mg/dL — AB (ref 65–99)
Potassium: 4.3 mmol/L (ref 3.5–5.1)
Sodium: 133 mmol/L — ABNORMAL LOW (ref 135–145)

## 2015-01-16 LAB — PROTIME-INR
INR: 1.36 (ref 0.00–1.49)
Prothrombin Time: 16.9 seconds — ABNORMAL HIGH (ref 11.6–15.2)

## 2015-01-16 MED ORDER — THIAMINE HCL 100 MG PO TABS: 100.0000 mg | ORAL_TABLET | Freq: Every day | ORAL | Status: DC

## 2015-01-16 MED ORDER — FOLIC ACID 1 MG PO TABS: 1.0000 mg | ORAL_TABLET | Freq: Every day | ORAL | Status: DC

## 2015-01-16 MED ORDER — RIVAROXABAN 20 MG PO TABS
20.0000 mg | ORAL_TABLET | Freq: Every day | ORAL | Status: DC
  Filled 2015-01-16: qty 1

## 2015-01-16 MED ORDER — WARFARIN SODIUM 7.5 MG PO TABS
7.5000 mg | ORAL_TABLET | Freq: Once | ORAL | Status: DC
  Filled 2015-01-16: qty 1

## 2015-01-16 MED ORDER — RIVAROXABAN (XARELTO) VTE STARTER PACK (15 & 20 MG): ORAL_TABLET | ORAL | Status: DC

## 2015-01-16 NOTE — Progress Notes (Signed)
VASCULAR LAB PRELIMINARY  PRELIMINARY  PRELIMINARY  PRELIMINARY  Bilateral lower extremity venous duplex  completed.    Preliminary report:  Bilateral:  No evidence of DVT, superficial thrombosis, or Baker's Cyst.    Kevin Martin, RVT 01/16/2015, 12:27 PM

## 2015-01-16 NOTE — Progress Notes (Addendum)
ANTICOAGULATION CONSULT NOTE - Follow-up  Pharmacy Consult for Warfarin  Indication: atrial fibrillation  No Known Allergies  Patient Measurements: Height: '6\' 2"'$  (188 cm) Weight: 224 lb 4.8 oz (101.742 kg) (a scale) IBW/kg (Calculated) : 82.2   Vital Signs: Temp: 98.6 F (37 C) (05/17 0031) Temp Source: Oral (05/17 0031) BP: 129/61 mmHg (05/17 1012) Pulse Rate: 54 (05/17 1012)  Labs:  Recent Labs  01/14/15 1239 01/14/15 1830 01/15/15 0040 01/15/15 0635 01/16/15 0238  HGB 16.3  --   --   --  16.5  HCT 46.2  --   --   --  46.8  PLT 148*  --   --   --  150  LABPROT 14.9  --   --  14.8 16.9*  INR 1.16  --   --  1.15 1.36  CREATININE 0.78  --   --  0.94 0.88  TROPONINI 0.03 0.03 0.03 0.03  --     Estimated Creatinine Clearance: 108 mL/min (by C-G formula based on Cr of 0.88).  Assessment: 51 YOM with gradual onset and worsening of SOB for the past 2 weeks. He is on Coumadin at home for h/o of Afib. He takes Coumadin 2.5 mg on M/W/F and 5 mg on all other days. His last dose was Coumadin was yesterday. Pharmacy consulted to resume therapy. INR remains subtherapeutic at 1.36. No bleeding noted.   Goal of Therapy:  INR 2-3 Monitor platelets by anticoagulation protocol: Yes   Plan:  - Repeat warfarin 7.'5mg'$  PO x 1 tonight - Daily INR  Levester Fresh, PharmD, BCPS Clinical Pharmacist 5/17/201611:04 AM  Addendum: Now transitioning to [place].   Plan: - Xarelto '20mg'$  daily - F/u S&S of bleeding, renal fxn - Will educate patient and sign-off consult to follow peripherally until discharge. Thank you for the consult!  Salome Arnt, PharmD, BCPS Pager # [phone] 01/16/2015 11:25 AM

## 2015-01-16 NOTE — Discharge Summary (Signed)
Kevin Martin, is a 64 y.o. male  DOB [birth year]  MRN [id number].  Admission date:  01/14/2015  Admitting Physician  Orson Eva, MD  Discharge Date:  01/16/2015   Primary MD  No primary care provider on file.  Recommendations for primary care physician for things to follow:  - Check CBC, BMP during next visit. - Patient warfarin has been stopped, started on Xarelto for A. Fib. - And to follow with cardiology as an outpatient   Admission Diagnosis  Hyponatremia [E87.1] Acute on chronic congestive heart failure, unspecified congestive heart failure type [I50.9]   Discharge Diagnosis  Hyponatremia [E87.1] Acute on chronic congestive heart failure, unspecified congestive heart failure type [I50.9]    Principal Problem:   Acute respiratory failure Active Problems:   Atrial fibrillation, controlled   Moderate aortic stenosis   Chronic anticoagulation   Hypertension   Alcohol abuse, daily use   Acute hyponatremia   Acute on chronic congestive heart failure      Past Medical History  Diagnosis Date  . Hypertension   . Atrial fibrillation   . Cardiomegaly   . Shortness of breath dyspnea   . CHF (congestive heart failure)     Past Surgical History  Procedure Laterality Date  . Neck surgery    . Fracture surgery Right     right arm       History of present illness and  Hospital Course:     Kindly see H&P for history of present illness and admission details, please review complete Labs, Consult reports and Test reports for all details in brief  HPI  from the history and physical done on the day of admission  This is a 64 year old male patient with long-standing hypertension. During previous admission in 2013 documented as untreated hypertension. Patient also has a history of atrial fibrillation on Coumadin. He was initially admitted 10 days ago for dyspnea with orthopnea concerning for heart  failure exacerbation. Unfortunately on 5/5 before evaluation could be completed and before patient could be stabilized from a respiratory standpoint he left AMA stating his daughter was in a motor vehicle crash in [place] and had to leave. Patient returned to the ER today for persistent shortness of breath; this has been associated with orthopnea and nocturnal dyspnea noting patient unable to sleep. He denied lower extremity edema, weight gain or dyspnea on exertion. He has been reporting nasal congestion with associated fevers and chills for several days. This past Friday he had chest pressure in the center of his chest that lasted all day. This pain did not radiate. It was not associated with diaphoresis or change in his shortness of breath. No apparent change in chest pain with general mobilization. According to his family at the bedside patient snores very heavily.  In the ER patient is afebrile, pulse is controlled at 55 bpm and he is in nature fibrillation, blood pressures 137/66. He is maintaining room air saturations at 96%. Patient's sodium was down to 126 noting sodium was 138 on 5/5.  Otherwise the panel unremarkable. Glucose slightly elevated at 116. BNP 246. Initial troponin 0.03. EKG stable with a fibrillation slow ventricular response with inverted T waves in inferior lateral leads similar to EKG last admission. Unfortunately no prior EKGs for comparison. Please note during last hospitalization patient's enzymes remained negative. CBC was normal except for mildly decreased platelets of 148,000 and elevated monocytes of 14. INR subtherapeutic at 1.16. Chest x-ray with findings consistent with COPD but no other acute cardiopulmonary findings.   Hospital Course   Acute on chronic diastolic CHF - Most recent echo 01/04/15 showing EF 55-60%, and mild right ventricular dilatation with moderate concentric hypertrophy and moderate aortic stenosis. Chest x-ray showing evidence of volume overload on  admission. - Most likely in the setting of noncompliance with medication and diet. - Initially on IV diuresis, transitioned to 40 mg by mouth Lasix with good response. - Venous Doppler negative for DVT  Hyponatremia - A candidate to volume overload, improved with diuresis   Atrial fibrillation - Metoprolol has been stopped giving patient bradycardia as per cardiology recommendation, transitioned with Xarelto, his manager has been consulted, and evidence for Xarelto as an outpatient.  Alcohol abuse - on CIWA during hospital stay, no evidence of DTs, will discharge him on thiamine and folic acid  Hypertension - Blood pressure acceptable, continue with home medication.  Hyperglycemia - hemoglobin A1c 6.2  Chest pain - nontypical, no recurrence since admission. - Troponins negative 4.     Discharge Condition: Stable   Follow UP  Follow-up Information    Follow up with Richardson Dopp, PA-C On 02/14/2015.   Specialties:  Physician Assistant, Radiology, Interventional Cardiology   Why:  11:30am   Contact information:   [address] [address] [phone]       Follow up with [facility] On 01/23/2015.   Specialty:  Cardiology   Why:  Outpatient BMET at anytime during normal lab hour between 8:00am to 4:30 pm. Bmet check renal function after increase in diuretic   Contact information:   [address] [place] [phone]      Follow up with [facility]     On 01/23/2015.   Why:  10:00am   PLEASE BRING COPY OF DC INSTRUCTIONS, ALL MEDS YOU ARE TAKING, AND $20 COPAY IF YOU ARE ABLE   Contact information:   [place] [address] [phone]        Discharge Instructions  and  Discharge Medications     Discharge Instructions    Diet - low sodium heart healthy    Complete by:  As directed      Discharge instructions     Complete by:  As directed   Follow with urology and wellness clinic on schedule appointment Get CBC, CMP, 2 view Chest X ray checked  by Primary MD next visit.    Activity: As tolerated with Full fall precautions use walker/cane & assistance as needed   Disposition Home    Diet: Heart Healthy , low-salt , with feeding assistance and aspiration precautions.  For Heart failure patients - Check your Weight same time everyday, if you gain over 2 pounds, or you develop in leg swelling, experience more shortness of breath or chest pain, call your Primary MD immediately. Follow Cardiac Low Salt Diet and 1.5 lit/day fluid restriction.   On your next visit with your primary care physician please Get  Medicines reviewed and adjusted.   Please request your Prim.MD to go over all Hospital Tests and Procedure/Radiological results at the follow up, please get all Hospital records sent to your Prim MD by signing hospital release before you go home.   If you experience worsening of your admission symptoms, develop shortness of breath, life threatening emergency, suicidal or homicidal thoughts you must seek medical attention immediately by calling 911 or calling your MD immediately  if symptoms less severe.  You Must read complete instructions/literature along with all the possible adverse reactions/side effects for all the Medicines you take and that have been prescribed to you. Take any new Medicines after you have completely understood and accpet all the possible adverse reactions/side effects.   Do not drive, operating heavy machinery, perform activities at heights, swimming or participation in water activities or provide baby sitting services if your were admitted for syncope or siezures until you have seen by Primary MD or a Neurologist and advised to do so again.  Do not drive when taking Pain medications.    Do not take more than prescribed Pain, Sleep and Anxiety Medications  Special  Instructions: If you have smoked or chewed Tobacco  in the last 2 yrs please stop smoking, stop any regular Alcohol  and or any Recreational drug use.  Wear Seat belts while driving.   Please note  You were cared for by a hospitalist during your hospital stay. If you have any questions about your discharge medications or the care you received while you were in the hospital after you are discharged, you can call the unit and asked to speak with the hospitalist on call if the hospitalist that took care of you is not available. Once you are discharged, your primary care physician will handle any further medical issues. Please note that NO REFILLS for any discharge medications will be authorized once you are discharged, as it is imperative that you return to your primary care physician (or establish a relationship with a primary care physician if you do not have one) for your aftercare needs so that they can reassess your need for medications and monitor your lab values.     Increase activity slowly    Complete by:  As directed             Medication List    STOP taking these medications        metoprolol tartrate 25 MG tablet  Commonly known as:  LOPRESSOR     warfarin 5 MG tablet  Commonly known as:  COUMADIN      TAKE these medications        amLODipine 10 MG tablet  Commonly known as:  NORVASC  Take 10 mg by mouth daily.     folic acid 1 MG tablet  Commonly known as:  FOLVITE  Take 1 tablet (1 mg total) by mouth daily.     furosemide 40 MG tablet  Commonly known as:  LASIX  Take 40 mg by mouth daily.     lisinopril 40 MG tablet  Commonly known as:  PRINIVIL,ZESTRIL  Take 40 mg by mouth daily.     multivitamin with minerals Tabs tablet  Take 1 tablet by mouth daily.     omega-3 acid ethyl esters 1 G capsule  Commonly known as:  LOVAZA  Take 1 g by mouth 3 (three) times daily.     pravastatin 80 MG tablet  Commonly known as:  PRAVACHOL  Take 80 mg by  mouth daily.      Rivaroxaban 15 & 20 MG Tbpk  Commonly known as:  XARELTO STARTER PACK  Take as directed on package: Start with one '15mg'$  tablet by mouth twice a day with food. On Day 22, switch to one '20mg'$  tablet once a day with food.     thiamine 100 MG tablet  Take 1 tablet (100 mg total) by mouth daily.          Diet and Activity recommendation: See Discharge Instructions above   Consults obtained -  Cardiology   Major procedures and Radiology Reports - PLEASE review detailed and final reports for all details, in brief -      Dg Chest 2 View  01/14/2015   CLINICAL DATA:  Cough and shortness of breath for 1 month  EXAM: CHEST  2 VIEW  COMPARISON:  Jan 03, 2015  FINDINGS: The heart size and mediastinal contours are stable. The heart size is enlarged. There is mild prominence of pulmonary interstitium unchanged. There is no focal consolidation, pleural effusion or pneumothorax. The visualized skeletal structures are stable.  IMPRESSION: No active cardiopulmonary disease.  Stable cardiomegaly.   Electronically Signed   By: Abelardo Diesel M.D.   On: 01/14/2015 13:39   Dg Chest 2 View  01/03/2015   CLINICAL DATA:  Smoker.  Elevated white blood cell count.  EXAM: CHEST  2 VIEW  COMPARISON:  PA and lateral chest 09/08/2005.  FINDINGS: Mild prominence of the pulmonary interstitium is seen. No consolidative process, pneumothorax or effusion is identified. There is cardiomegaly. No focal bony abnormality is identified.  IMPRESSION: Cardiomegaly without acute disease.   Electronically Signed   By: Inge Rise M.D.   On: 01/03/2015 19:00    Micro Results     No results found for this or any previous visit (from the past 240 hour(s)).     Today   Subjective:   Kevin Martin today has no headache,no chest abdominal pain,no new weakness tingling or numbness, feels much better wants to go home today.   Objective:   Blood pressure 129/61, pulse 54, temperature 98.6 F (37 C), temperature source  Oral, resp. rate 18, height '6\' 2"'$  (1.88 m), weight 101.742 kg (224 lb 4.8 oz), SpO2 95 %.   Intake/Output Summary (Last 24 hours) at 01/16/15 1346 Last data filed at 01/16/15 1013  Gross per 24 hour  Intake   1255 ml  Output   1900 ml  Net   -645 ml    Exam Awake Alert, Oriented x 3, No new F.N deficits, Normal affect [place].AT,PERRAL Supple Neck,No JVD, No cervical lymphadenopathy appriciated.  Symmetrical Chest wall movement, Good air movement bilaterally, CTAB ,No Gallops,Rubs or new Murmurs, No Parasternal Heave +ve B.Sounds, Abd Soft, Non tender, No organomegaly appriciated, No rebound -guarding or rigidity. No Cyanosis, Clubbing or edema, No new Rash or bruise  Data Review   CBC w Diff: Lab Results  Component Value Date   WBC 6.9 01/16/2015   HGB 16.5 01/16/2015   HCT 46.8 01/16/2015   PLT 150 01/16/2015   LYMPHOPCT 21 01/14/2015   MONOPCT 14* 01/14/2015   EOSPCT 0 01/14/2015   BASOPCT 0 01/14/2015    CMP: Lab Results  Component Value Date   NA 133* 01/16/2015   K 4.3 01/16/2015   CL 94* 01/16/2015   CO2 29 01/16/2015   BUN 12 01/16/2015   CREATININE 0.88 01/16/2015   PROT 6.7 01/14/2015   ALBUMIN 3.8 01/14/2015   BILITOT 0.8  01/14/2015   ALKPHOS 56 01/14/2015   AST 36 01/14/2015   ALT 27 01/14/2015  .   Total Time in preparing paper work, data evaluation and todays exam - 35 minutes  ELGERGAWY, DAWOOD M.D on 01/16/2015 at [place] PM  [facility]   Office  [phone]

## 2015-01-16 NOTE — Progress Notes (Signed)
Orders received for pt discharge.  Discharge summary printed and reviewed with pt.  Explained medication regimen, and pt had no further questions at this time.  IV removed and site remains clean, dry, intact.  Telemetry removed.  Pt in stable condition and awaiting transport. 

## 2015-01-16 NOTE — Care Management Note (Signed)
Case Management Note  Patient Details  Name: Kevin Martin MRN: [id number] Date of Birth: 1951/05/20  Subjective/Objective:   Pt admitted on 01/14/15 with acute respiratory failure secondary to CHF.  PTA, pt resides at home with girlfriend.                Action/Plan: Pt for dc home today on Xarelto.  States he has no insurance or PCP.    Expected Discharge Date:   01/16/15               Expected Discharge Plan:  Home/Self Care  In-House Referral:  Clinical Social Work  Discharge planning Services  CM Consult, [place] [facility], Medication Assistance, [facility]  Post Acute Care Choice:    Choice offered to:     DME Arranged:    DME Agency:     HH Arranged:    [place] Agency:     Status of Service:  Completed, signed off  Medicare Important Message Given:  No Date Medicare IM Given:    Medicare IM give by:    Date Additional Medicare IM Given:    Additional Medicare Important Message give by:     If discussed at [place] of Stay Meetings, dates discussed:    Additional Comments: Pt given 30 day free trial card for Xarelto.  Pt also eligible for med assistance through [place] letter given with explanation of program benefits.  Appt made for pt at [facility] and [facility] for PCP follow up.  Appt is May 24 at 10:00; appt info put on AVS.  Pharmacist at [facility] to follow up with pt on Xarelto patient assistance program at this appt.  Pt verbalizes understanding of trial card, [place] letter and appt instructions.    Ella Bodo, RN 01/16/2015, 4:07 PM  >Phone [phone]

## 2015-01-23 ENCOUNTER — Encounter: Payer: Self-pay | Admitting: Family Medicine

## 2015-01-23 ENCOUNTER — Ambulatory Visit: Payer: Self-pay | Attending: Family Medicine | Admitting: Family Medicine

## 2015-01-23 ENCOUNTER — Other Ambulatory Visit (INDEPENDENT_AMBULATORY_CARE_PROVIDER_SITE_OTHER): Payer: Self-pay | Admitting: *Deleted

## 2015-01-23 VITALS — BP 121/71 | HR 61 | Temp 98.5°F | Resp 18 | Ht 74.0 in | Wt 226.0 lb

## 2015-01-23 DIAGNOSIS — R7303 Prediabetes: Secondary | ICD-10-CM | POA: Insufficient documentation

## 2015-01-23 DIAGNOSIS — I10 Essential (primary) hypertension: Secondary | ICD-10-CM | POA: Insufficient documentation

## 2015-01-23 DIAGNOSIS — Z720 Tobacco use: Secondary | ICD-10-CM

## 2015-01-23 DIAGNOSIS — Z7901 Long term (current) use of anticoagulants: Secondary | ICD-10-CM | POA: Insufficient documentation

## 2015-01-23 DIAGNOSIS — I517 Cardiomegaly: Secondary | ICD-10-CM | POA: Insufficient documentation

## 2015-01-23 DIAGNOSIS — I4891 Unspecified atrial fibrillation: Secondary | ICD-10-CM | POA: Insufficient documentation

## 2015-01-23 DIAGNOSIS — F17200 Nicotine dependence, unspecified, uncomplicated: Secondary | ICD-10-CM | POA: Insufficient documentation

## 2015-01-23 DIAGNOSIS — I5033 Acute on chronic diastolic (congestive) heart failure: Secondary | ICD-10-CM

## 2015-01-23 DIAGNOSIS — R001 Bradycardia, unspecified: Secondary | ICD-10-CM | POA: Insufficient documentation

## 2015-01-23 DIAGNOSIS — R7309 Other abnormal glucose: Secondary | ICD-10-CM | POA: Insufficient documentation

## 2015-01-23 DIAGNOSIS — F1010 Alcohol abuse, uncomplicated: Secondary | ICD-10-CM

## 2015-01-23 LAB — BASIC METABOLIC PANEL
BUN: 13 mg/dL (ref 6–23)
CHLORIDE: 98 meq/L (ref 96–112)
CO2: 28 meq/L (ref 19–32)
Calcium: 9.5 mg/dL (ref 8.4–10.5)
Creatinine, Ser: 0.75 mg/dL (ref 0.40–1.50)
GFR: 111.41 mL/min (ref >60.00)
Glucose, Bld: 104 mg/dL — ABNORMAL HIGH (ref 70–99)
Potassium: 4.3 mEq/L (ref 3.5–5.1)
Sodium: 132 mEq/L — ABNORMAL LOW (ref 135–145)

## 2015-01-23 MED ORDER — RIVAROXABAN 20 MG PO TABS: 20.0000 mg | ORAL_TABLET | Freq: Every day | ORAL | Status: DC

## 2015-01-23 MED ORDER — FUROSEMIDE 40 MG PO TABS: 40.0000 mg | ORAL_TABLET | Freq: Every day | ORAL | Status: DC

## 2015-01-23 MED ORDER — AMLODIPINE BESYLATE 10 MG PO TABS: 10.0000 mg | ORAL_TABLET | Freq: Every day | ORAL | Status: DC

## 2015-01-23 MED ORDER — LISINOPRIL 40 MG PO TABS: 40.0000 mg | ORAL_TABLET | Freq: Every day | ORAL | Status: DC

## 2015-01-23 MED ORDER — PRAVASTATIN SODIUM 80 MG PO TABS: 80.0000 mg | ORAL_TABLET | Freq: Every day | ORAL | Status: DC

## 2015-01-23 NOTE — Progress Notes (Signed)
Subjective:    Patient ID: Kevin Martin, male    DOB: [birth year], 64 y.o.   MRN: [id number]  HPI  Admit date: 01/14/2015. Discharge date: 01/16/15.  Kevin Martin is a 64 year old male with a history of hypertension, atrial fibrillation on Coumadin who had presented to the ED with shortness of breath, orthopnea, paroxysmal nocturnal dyspnea associated fevers and chills. He also had chest pressure. He had also presented 10 days prior to his current presentation with similar symptoms and Left AMA due to family emergency.  In the ER patient was afebrile, HR was 55 bpm and he was in atrial fibrillation, BP was 137/66, oxygen saturation was 96%. Patient's sodium was down to 126 noting sodium was 138 on 5/5. Otherwise the panel unremarkable. Glucose slightly elevated at 116. BNP 246. Initial troponin 0.03. EKG stable with atrial fibrillation slow ventricular response with inverted T waves in inferior lateral leads similar to EKG last admission. Unfortunately no prior EKGs for comparison. During last hospitalization patient's enzymes remained negative. CBC was normal except for mildly decreased platelets of 148,000 and elevated monocytes of 14. INR subtherapeutic at 1.16. His chest x-ray revealed stable cardiomegaly, 2-D echo from 01/04/15 revealed an ejection fraction of 55-60%, mild right ventricular dilatation moderate concentric hypertrophy and moderate aortic stenosis. He was placed on IV Lasix and later by mouth Lasix. Metoprolol was stopped due to the patient's bradycardia and he was transitioned from Coumadin to Xarelto for his atrial fibrillation.  Interval history: He reports doing well and has been compliant with his medications as well as diet. He still drinks alcohol states he is not ready to quit. He has no other complaints at this time. Has upcoming appointments for labs at the cardiology clinic today and later in June for follow-up in June.  Past Medical History  Diagnosis Date  .  Hypertension   . Atrial fibrillation   . Cardiomegaly   . Shortness of breath dyspnea   . CHF (congestive heart failure)     Past Surgical History  Procedure Laterality Date  . Neck surgery    . Fracture surgery Right     right arm     History   Social History  . Marital Status: Divorced    Spouse Name: N/A  . Number of Children: N/A  . Years of Education: N/A   Occupational History  . Not on file.   Social History Main Topics  . Smoking status: Current Every Day Smoker -- 1.00 packs/day for 10 years    Types: Cigars  . Smokeless tobacco: Never Used     Comment: quit cigarette smoking 10 years ago  . Alcohol Use: Yes     Comment: every day 2-3 mixed drinks  . Drug Use: No  . Sexual Activity: Not on file   Other Topics Concern  . Not on file   Social History Narrative    No Known Allergies  Current Outpatient Prescriptions on File Prior to Visit  Medication Sig Dispense Refill  . amLODipine (NORVASC) 10 MG tablet Take 10 mg by mouth daily.    . folic acid (FOLVITE) 1 MG tablet Take 1 tablet (1 mg total) by mouth daily. 30 tablet 0  . furosemide (LASIX) 40 MG tablet Take 40 mg by mouth daily.    Marland Kitchen lisinopril (PRINIVIL,ZESTRIL) 40 MG tablet Take 40 mg by mouth daily.    . Multiple Vitamin (MULTIVITAMIN WITH MINERALS) TABS tablet Take 1 tablet by mouth daily.    Marland Kitchen omega-3 acid  ethyl esters (LOVAZA) 1 G capsule Take 1 g by mouth 3 (three) times daily.    . pravastatin (PRAVACHOL) 80 MG tablet Take 80 mg by mouth daily.    . Rivaroxaban (XARELTO STARTER PACK) 15 & 20 MG TBPK Take as directed on package: Start with one '15mg'$  tablet by mouth twice a day with food. On Day 22, switch to one '20mg'$  tablet once a day with food. 51 each 0  . thiamine 100 MG tablet Take 1 tablet (100 mg total) by mouth daily. 30 tablet 0   No current facility-administered medications on file prior to visit.       Review of Systems  General: negative for fever, weight loss, appetite  change Eyes: no visual symptoms. ENT: no ear symptoms, no sinus tenderness, no nasal congestion or sore throat. Neck: no pain  Respiratory: no wheezing, shortness of breath, cough Cardiovascular: no chest pain, no dyspnea on exertion, no pedal edema, no orthopnea. Gastrointestinal: no abdominal pain, no diarrhea, no constipation Genito-Urinary: no urinary frequency, no dysuria, no polyuria. Hematologic: no bruising Endocrine: no cold or heat intolerance Neurological: no headaches, no seizures, no tremors Musculoskeletal: no joint pains, no joint swelling Skin: no pruritus, no rash. Psychological: no depression, no anxiety,       Objective: Filed Vitals:   01/23/15 0952  BP: 121/71  Pulse: 61  Temp: 98.5 F (36.9 C)  TempSrc: Oral  Resp: 18  Height: '6\' 2"'$  (1.88 m)  Weight: 226 lb (102.513 kg)  SpO2: 93%      Physical Exam  Constitutional: normal appearing,  Eyes: PERRLA HEENT: Head is atraumatic, normal sinuses, normal oropharynx, normal appearing tonsils and palate, tympanic membrane is normal bilaterally. Neck: normal range of motion, no thyromegaly, no JVD Cardiovascular: normal rate and rhythm, normal heart sounds, no murmurs, rub or gallop, no pedal edema Respiratory: clear to auscultation bilaterally, no wheezes, no rales, no rhonchi Abdomen: soft, not tender to palpation, normal bowel sounds, no enlarged organs Extremities: Full ROM, no tenderness in joints Skin: warm and dry, no lesions. Neurological: alert, oriented x3, cranial nerves I-XII grossly intact , normal motor strength, normal sensation. Psychological: normal mood.         Assessment & Plan:  64 year old patient with a history of atrial fibrillation currently on anticoagulation, hypertension, acute on chronic diastolic heart failure, prediabetes, alcohol abuse here for hospital follow-up and currently doing well on medications.  Acute on chronic diastolic heart failure: Euvolemic and no evidence  of acute exacerbation. Unable to place on beta blocker because of previous bradycardia. Limit fluids to 2 L a day, advised and daily weights, advised and low-sodium diet.  Essential hypertension: Controlled. Continue medications.  Prediabetes: A1c of 6.2. Lifestyle modifications advised.  Tobacco abuse: Smoking cessation support: smoking cessation hotline: 1-800-QUIT-NOW.  Smoking cessation classes are available through [facility]. Call [phone] or visit our website at [email or web address]  Spent 3 minutes counseling on smoking cessation and patient is not ready to quit.

## 2015-01-23 NOTE — Patient Instructions (Signed)

## 2015-01-23 NOTE — Progress Notes (Signed)
Patient hospitalized last week for congestive heart failure. Patient reports feeling better, denies pain at this time. Patient has cough x 3 weeks, reports it is getting better. Patient has primary care physician. Patient was on metoprolol prior to hospital stay, questions about should he take metoprolol again? Patient has not been taking metoprolol since being hospitalized.

## 2015-02-06 ENCOUNTER — Encounter: Payer: Self-pay | Admitting: Family Medicine

## 2015-02-06 ENCOUNTER — Ambulatory Visit: Payer: Self-pay | Attending: Family Medicine | Admitting: Family Medicine

## 2015-02-06 VITALS — Resp 16 | Ht 74.0 in | Wt 227.0 lb

## 2015-02-06 DIAGNOSIS — I509 Heart failure, unspecified: Secondary | ICD-10-CM

## 2015-02-06 NOTE — Progress Notes (Signed)
Pt stated has PCP and do not want to establish care at our clinic

## 2015-02-08 ENCOUNTER — Ambulatory Visit: Payer: Self-pay | Admitting: Family Medicine

## 2015-02-13 NOTE — Progress Notes (Signed)
Cardiology Office Note   Date:  02/14/2015   ID:  Kevin Martin, DOB Aug 28, 1951, MRN [id number]  PCP:  Berkley Harvey, NP  Cardiologist:  Dr. Dorris Carnes     Chief Complaint  Patient presents with  . Atrial Fibrillation  . Congestive Heart Failure  . Aortic Stenosis     History of Present Illness: Kevin Martin is a 64 y.o. male with a hx of chronic AFib on anticoagulation, HTN, ETOH use, cigar use.  He was seen by his PCP 01/03/15 for worsening SOB and sent to the ED due to findings of CHF on CXR.  He was admitted but left AMA.    Re-admitted 5/15-5/17 with persistent shortness of breath, orthopnea and nocturnal dyspnea and an chest pressure.  Echocardiogram from previous admission had demonstrated normal LV function with moderate LVH and moderate aortic stenosis (mean gradient 26 mmHg), biatrial enlargement.  He was followed by cardiology. Beta blocker was stopped secondary to bradycardia. Cardiac enzymes remained normal. T-wave changes were noted on electrocardiogram. Outpatient Myoview was suggested. Coumadin was changed to Xarelto for anticoagulation.  He returns for follow-up.  Since discharge, he has been doing well. He denies chest pain, syncope, orthopnea, PND or significant edema. He denies significant dyspnea. He is NYHA 2. He denies any bleeding issues.   Studies/Reports Reviewed Today:  Echo 01/04/15 - Moderateconcentric hypertrophy. EF 55% to 60%. Wallmotion was normal; - Aortic valve: Valve mobility was mildly restricted. There wasmoderate stenosis. Mean gradient (S): 26 mm Hg. Peak gradient (S): 61 mm Hg. There was mild regurgitation. - Aortic root: The aortic root was mildly dilated. - Mitral valve: There was mild regurgitation. - Left atrium: The atrium was severely dilated. - Right ventricle: The cavity size was mildly dilated. Wallthickness was normal. - Right atrium: The atrium was moderately dilated. - Pericardium, extracardiac: A trivial pericardial effusion  wasidentified.  Myoview 10/1999 IMPRESSION 1. NO EVIDENCE OF MYOCARDIAL ISCHEMIA OR SCAR. SLIGHTLY SUBMAXIMAL ACHIEVED HEART RATE. 2. CALCULATED LEFT VENTRICULAR EJECTION FRACTION OF 52%.   Past Medical History  Diagnosis Date  . Hypertension   . Atrial fibrillation   . Cardiomegaly   . Shortness of breath dyspnea   . CHF (congestive heart failure)     Past Surgical History  Procedure Laterality Date  . Neck surgery    . Fracture surgery Right     right arm     Current Outpatient Prescriptions  Medication Sig Dispense Refill  . amLODipine (NORVASC) 10 MG tablet Take 1 tablet (10 mg total) by mouth daily. 30 tablet 2  . folic acid (FOLVITE) 1 MG tablet Take 1 tablet (1 mg total) by mouth daily. 30 tablet 0  . furosemide (LASIX) 40 MG tablet Take 1 tablet (40 mg total) by mouth daily. 30 tablet 2  . lisinopril (PRINIVIL,ZESTRIL) 40 MG tablet Take 1 tablet (40 mg total) by mouth daily. 30 tablet 2  . Multiple Vitamin (MULTIVITAMIN WITH MINERALS) TABS tablet Take 1 tablet by mouth daily.    Marland Kitchen omega-3 acid ethyl esters (LOVAZA) 1 G capsule Take 1 g by mouth 3 (three) times daily.    . pravastatin (PRAVACHOL) 80 MG tablet Take 1 tablet (80 mg total) by mouth daily. 30 tablet 2  . rivaroxaban (XARELTO) 20 MG TABS tablet Take 1 tablet (20 mg total) by mouth daily with supper. 30 tablet 3  . thiamine 100 MG tablet Take 1 tablet (100 mg total) by mouth daily. 30 tablet 0   No  current facility-administered medications for this visit.    Allergies:   Review of patient's allergies indicates no known allergies.    Social History:  The patient  reports that he has been smoking Cigars.  He has never used smokeless tobacco. He reports that he drinks alcohol. He reports that he does not use illicit drugs.   Family History:  The patient's family history includes Cancer in his father and mother; Stroke in his maternal grandfather. There is no history of Heart attack.    ROS:   Please  see the history of present illness.   Review of Systems  Hematologic/Lymphatic: Negative for bleeding problem.  All other systems reviewed and are negative.    PHYSICAL EXAM: VS:  BP 110/60 mmHg  Pulse 54  Ht '6\' 2"'$  (1.88 m)  Wt 227 lb (102.967 kg)  BMI 29.13 kg/m2    Wt Readings from Last 3 Encounters:  02/14/15 227 lb (102.967 kg)  02/06/15 227 lb (102.967 kg)  01/23/15 226 lb (102.513 kg)     GEN: Well nourished, well developed, in no acute distress HEENT: normal Neck: no JVD, no carotid bruits, no masses Cardiac:  Normal R4/W5, RRR; 2/6 systolic murmur RUSB,  no rubs or gallops, no edema   Respiratory:  Decreased breath sounds bilaterally, no wheezing, rhonchi or rales. GI: soft, nontender, nondistended, + BS MS: no deformity or atrophy Skin: warm and dry  Neuro:  CNs II-XII intact, Strength and sensation are intact Psych: Normal affect   EKG:  EKG is ordered today.  It demonstrates:   Atrial fibrillation, HR 54, normal axis, T-wave inversions in V4-V6   Recent Labs: 01/04/2015: Magnesium 1.9; TSH 1.262 01/14/2015: ALT 27; B Natriuretic Peptide 246.0* 01/16/2015: Hemoglobin 16.5; Platelets 150 01/23/2015: BUN 13; Creatinine, Ser 0.75; Potassium 4.3; Sodium 132*    Lipid Panel No results found for: CHOL, TRIG, HDL, CHOLHDL, VLDL, LDLCALC, LDLDIRECT    ASSESSMENT AND PLAN:  Chronic diastolic CHF (congestive heart failure):  Volume stable. Labs since discharge demonstrates stable potassium and creatinine. Continue current therapy. Continue to monitor weights. Continue to limit salt.  Chronic atrial fibrillation:  Rate is controlled. Continue Xarelto. He does not have insurance. I will provide him with samples of Xarelto today. I will try to get him applied for patient assistance. If this is unsuccessful, we will need to send him back to his PCP to transition him from Xarelto back to Coumadin.  Moderate aortic stenosis:  He was previously followed by cardiology in [facility] (Dr. Linus Salmons). I will request prior echocardiogram, stress test, EKG. Recent echocardiogram last month demonstrates moderate aortic stenosis with a mean gradient 26 mmHg. There was some concern about his abnormal ECG and whether or not he needed a stress test. I will hold off on ordering a stress test until we can review his previous ECGs and stress testing.  Essential hypertension:  Controlled.   Tobacco abuse:  I have asked him to quit.   Alcohol abuse, daily use:  I have asked him to limit himself to 1 drink per day.   Abnormal ECG:  He is not having chest pain. Will request prior records including recent ECG. Will hold off on any stress testing for now.     Current medicines are reviewed at length with the patient today.  Concerns regarding medicines are as outlined above.  The following changes have been made:    As above  Labs/ tests ordered today include:   Orders Placed This Encounter  Procedures  . EKG 12-Lead    Disposition:   FU with Dr. Dorris Carnes 2-3 mos.    Signed, Versie Starks, MHS 02/14/2015 1:38 PM    [facility] [place], [place], [place]  [address] Phone: [phone]; Fax: [phone]

## 2015-02-14 ENCOUNTER — Ambulatory Visit (INDEPENDENT_AMBULATORY_CARE_PROVIDER_SITE_OTHER): Payer: Self-pay | Admitting: Physician Assistant

## 2015-02-14 ENCOUNTER — Encounter: Payer: Self-pay | Admitting: Physician Assistant

## 2015-02-14 VITALS — BP 110/60 | HR 54 | Ht 74.0 in | Wt 227.0 lb

## 2015-02-14 DIAGNOSIS — I482 Chronic atrial fibrillation, unspecified: Secondary | ICD-10-CM | POA: Insufficient documentation

## 2015-02-14 DIAGNOSIS — Z720 Tobacco use: Secondary | ICD-10-CM

## 2015-02-14 DIAGNOSIS — I10 Essential (primary) hypertension: Secondary | ICD-10-CM

## 2015-02-14 DIAGNOSIS — I350 Nonrheumatic aortic (valve) stenosis: Secondary | ICD-10-CM

## 2015-02-14 DIAGNOSIS — I5032 Chronic diastolic (congestive) heart failure: Secondary | ICD-10-CM

## 2015-02-14 DIAGNOSIS — R9431 Abnormal electrocardiogram [ECG] [EKG]: Secondary | ICD-10-CM

## 2015-02-14 DIAGNOSIS — F1010 Alcohol abuse, uncomplicated: Secondary | ICD-10-CM

## 2015-02-14 NOTE — Patient Instructions (Addendum)
Medication Instructions:  Your physician recommends that you continue on your current medications as directed. Please refer to the Current Medication list given to you today.   Labwork: NONE  Testing/Procedures: NONE  Follow-Up: DR. Harrington Challenger ON 05/04/15 @ 9:30  Any Other Special Instructions Will Be Listed Below (If Applicable). WE WILL REQUEST RECORDS FROM [facility]

## 2015-02-19 ENCOUNTER — Ambulatory Visit: Payer: Self-pay | Attending: Family Medicine

## 2015-02-23 ENCOUNTER — Telehealth: Payer: Self-pay | Admitting: Physician Assistant

## 2015-02-23 NOTE — Telephone Encounter (Signed)
Records came in 5 pages gave to Ms. Arbie Cookey for [employer] to review.  02/23/15 ltd

## 2015-05-04 ENCOUNTER — Encounter: Payer: Self-pay | Admitting: Internal Medicine

## 2015-05-04 ENCOUNTER — Ambulatory Visit (INDEPENDENT_AMBULATORY_CARE_PROVIDER_SITE_OTHER): Payer: Self-pay | Admitting: Internal Medicine

## 2015-05-04 VITALS — BP 120/60 | HR 67 | Ht 74.0 in | Wt 224.0 lb

## 2015-05-04 DIAGNOSIS — I5032 Chronic diastolic (congestive) heart failure: Secondary | ICD-10-CM

## 2015-05-04 DIAGNOSIS — I10 Essential (primary) hypertension: Secondary | ICD-10-CM

## 2015-05-04 LAB — CBC
HCT: 51.3 % (ref 39.0–52.0)
HEMOGLOBIN: 17.7 g/dL — AB (ref 13.0–17.0)
MCHC: 34.4 g/dL (ref 30.0–36.0)
MCV: 92.3 fl (ref 78.0–100.0)
Platelets: 179 10*3/uL (ref 150.0–400.0)
RBC: 5.56 Mil/uL (ref 4.22–5.81)
RDW: 14.6 % (ref 11.5–15.5)
WBC: 10.7 10*3/uL — AB (ref 4.0–10.5)

## 2015-05-04 LAB — BASIC METABOLIC PANEL
BUN: 14 mg/dL (ref 6–23)
CO2: 28 mEq/L (ref 19–32)
Calcium: 9.4 mg/dL (ref 8.4–10.5)
Chloride: 100 mEq/L (ref 96–112)
Creatinine, Ser: 0.87 mg/dL (ref 0.40–1.50)
GFR: 93.79 mL/min (ref >60.00)
Glucose, Bld: 95 mg/dL (ref 70–99)
POTASSIUM: 4.1 meq/L (ref 3.5–5.1)
Sodium: 136 mEq/L (ref 135–145)

## 2015-05-04 NOTE — Progress Notes (Signed)
Cardiology Office Note   Date:  05/04/2015   ID:  Kevin Martin, DOB 02/21/1951, MRN [id number]  PCP:  Berkley Harvey, NP  Cardiologist:   Dorris Carnes, MD   No chief complaint on file.     History of Present Illness: Kevin Martin is a 64 y.o. male with a history of chronic AFib on anticoagulation, HTN, ETOH use, cigar use. He was seen by his PCP 01/03/15 for worsening SOB and sent to the ED due to findings of CHF on CXR. He was admitted but left AMA.   Re-admitted 5/15-5/17 with persistent shortness of breath, orthopnea and nocturnal dyspnea and an chest pressure. Echocardiogram from previous admission had demonstrated normal LV function with moderate LVH and moderate aortic stenosis (mean gradient 26 mmHg), biatrial enlargement. He was followed by cardiology. Beta blocker was stopped secondary to bradycardia. Cardiac enzymes remained normal. T-wave changes were noted on electrocardiogram. Outpatient Myoview was suggested. Coumadin was changed to Xarelto for anticoagulation. He was seen by Kathleen Argue in June  [place] then he says  his breathing is good  No CP  No edema Problems with back pain      Current Outpatient Prescriptions  Medication Sig Dispense Refill  . amLODipine (NORVASC) 10 MG tablet Take 1 tablet (10 mg total) by mouth daily. 30 tablet 2  . folic acid (FOLVITE) 1 MG tablet Take 1 tablet (1 mg total) by mouth daily. 30 tablet 0  . furosemide (LASIX) 40 MG tablet Take 1 tablet (40 mg total) by mouth daily. 30 tablet 2  . lisinopril (PRINIVIL,ZESTRIL) 40 MG tablet Take 1 tablet (40 mg total) by mouth daily. 30 tablet 2  . Multiple Vitamin (MULTIVITAMIN WITH MINERALS) TABS tablet Take 1 tablet by mouth daily.    Marland Kitchen omega-3 acid ethyl esters (LOVAZA) 1 G capsule Take 1 g by mouth 3 (three) times daily.    . pravastatin (PRAVACHOL) 80 MG tablet Take 1 tablet (80 mg total) by mouth daily. 30 tablet 2  . rivaroxaban (XARELTO) 20 MG TABS tablet Take 1 tablet (20 mg total) by mouth  daily with supper. 30 tablet 3  . thiamine 100 MG tablet Take 1 tablet (100 mg total) by mouth daily. 30 tablet 0   No current facility-administered medications for this visit.    Allergies:   Review of patient's allergies indicates no known allergies.   Past Medical History  Diagnosis Date  . Hypertension   . Atrial fibrillation   . Cardiomegaly   . Shortness of breath dyspnea   . CHF (congestive heart failure)     Past Surgical History  Procedure Laterality Date  . Neck surgery    . Fracture surgery Right     right arm     Social History:  The patient  reports that he has been smoking Cigars.  He has never used smokeless tobacco. He reports that he drinks alcohol. He reports that he does not use illicit drugs.   Family History:  The patient's family history includes Cancer in his father and mother; Stroke in his maternal grandfather. There is no history of Heart attack.    ROS:  Please see the history of present illness. All other systems are reviewed and  Negative to the above problem except as noted.    PHYSICAL EXAM: VS:  BP 120/60 mmHg  Pulse 67  Ht '6\' 2"'$  (1.88 m)  Wt 224 lb (101.606 kg)  BMI 28.75 kg/m2  SpO2 97%  GEN: Well  nourished, well developed, in no acute distress HEENT: normal Neck: no JVD, carotid bruits, or masses Cardiac: RRR; no murmurs, rubs, or gallops,no edema  Respiratory:  clear to auscultation bilaterally, normal work of breathing GI: soft, nontender, nondistended, + BS  No hepatomegaly  MS: no deformity Moving all extremities   Skin: warm and dry, no rash Neuro:  Strength and sensation are intact Psych: euthymic mood, full affect   EKG:  EKG is not ordered today.   Lipid Panel No results found for: CHOL, TRIG, HDL, CHOLHDL, VLDL, LDLCALC, LDLDIRECT    Wt Readings from Last 3 Encounters:  05/04/15 224 lb (101.606 kg)  02/14/15 227 lb (102.967 kg)  02/06/15 227 lb (102.967 kg)      ASSESSMENT AND PLAN:  1  afib  Continue  current meds including anticoagulation  2  Chronic distolic CHF  Volume status is good  3.  HL  Continue statin  4.  Mod AS  WIll need to f/u with period echoes.       Signed, Dorris Carnes, MD  05/04/2015 10:00 AM    [facility] [place], [place], [place]  [address] Phone: [phone]; Fax: [phone]

## 2015-05-04 NOTE — Patient Instructions (Addendum)
Your physician recommends that you continue on your current medications as directed. Please refer to the Current Medication list given to you today.  Your physician recommends that you return for lab work in: today (CBC, BMET)   Your physician wants you to follow-up in: April 2017 with Dr. Harrington Challenger.  You will receive a reminder letter in the mail two months in advance. If you don't receive a letter, please call our office to schedule the follow-up appointment.

## 2015-05-18 ENCOUNTER — Telehealth: Payer: Self-pay

## 2015-05-18 NOTE — Telephone Encounter (Signed)
Fay Records, MD at 05/04/2015 9:59 AM  rivaroxaban (XARELTO) 20 MG TABS tabletTake 1 tablet (20 mg total) by mouth daily with supper Patient Instructions     Your physician recommends that you continue on your current medications as directed. Please refer to the Current Medication list given to you today   Sample bottle of Xarelto 20 MG placed up front for patient pick up.

## 2015-06-12 ENCOUNTER — Telehealth: Payer: Self-pay | Admitting: Internal Medicine

## 2015-06-12 NOTE — Telephone Encounter (Signed)
I am not sure if I have authorization to release medical information to [facility], I will forward to Medical Records to help with this.

## 2015-06-12 NOTE — Telephone Encounter (Signed)
I cannot release diagnosis Codes. Or records without patient signing a release of information.

## 2015-06-12 NOTE — Telephone Encounter (Signed)
New Message      Office calling stating that they are trying to help pt get qualified for a medication assistance program, states that all they are missing is the diagnosis code for pt. Please call back and advise.

## 2015-08-29 ENCOUNTER — Encounter: Payer: Self-pay | Admitting: Internal Medicine

## 2015-12-03 ENCOUNTER — Encounter: Payer: Self-pay | Admitting: Internal Medicine

## 2015-12-03 ENCOUNTER — Ambulatory Visit (INDEPENDENT_AMBULATORY_CARE_PROVIDER_SITE_OTHER): Payer: Medicare Other | Admitting: Internal Medicine

## 2015-12-03 VITALS — BP 144/74 | HR 56 | Ht 74.0 in | Wt 223.8 lb

## 2015-12-03 DIAGNOSIS — I350 Nonrheumatic aortic (valve) stenosis: Secondary | ICD-10-CM

## 2015-12-03 NOTE — Progress Notes (Signed)
Cardiology Office Note   Date:  12/03/2015   ID:  Kevin Martin, DOB 1950/09/29, MRN [id number]  PCP:  Berkley Harvey, NP  Cardiologist:   Dorris Carnes, MD   No chief complaint on file.  F/U of atrial fib     History of Present Illness: Kevin Martin is a 65 y.o. male with a history of chronic atrial fibrillation, HTN, EtOH use, cigar use  I saw him in September 2016   Echo in Spring showed Mod LVE  Modareeate AS  Mean gradient of 26 mm Hg.  B BLocker stopped due to bradcyardia    Since seen he has done wll  Playing golf   No SOB  No CP  No dizziness  Bottoms of feet hurt with walking  No claudication      Outpatient Prescriptions Prior to Visit  Medication Sig Dispense Refill  . amLODipine (NORVASC) 10 MG tablet Take 1 tablet (10 mg total) by mouth daily. 30 tablet 2  . folic acid (FOLVITE) 1 MG tablet Take 1 tablet (1 mg total) by mouth daily. 30 tablet 0  . furosemide (LASIX) 40 MG tablet Take 1 tablet (40 mg total) by mouth daily. 30 tablet 2  . lisinopril (PRINIVIL,ZESTRIL) 40 MG tablet Take 1 tablet (40 mg total) by mouth daily. 30 tablet 2  . Multiple Vitamin (MULTIVITAMIN WITH MINERALS) TABS tablet Take 1 tablet by mouth daily.    Marland Kitchen omega-3 acid ethyl esters (LOVAZA) 1 G capsule Take 3 g by mouth daily.     . pravastatin (PRAVACHOL) 80 MG tablet Take 1 tablet (80 mg total) by mouth daily. 30 tablet 2  . rivaroxaban (XARELTO) 20 MG TABS tablet Take 1 tablet (20 mg total) by mouth daily with supper. 30 tablet 3  . thiamine 100 MG tablet Take 1 tablet (100 mg total) by mouth daily. 30 tablet 0   No facility-administered medications prior to visit.     Allergies:   Review of patient's allergies indicates no known allergies.   Past Medical History  Diagnosis Date  . Hypertension   . Atrial fibrillation ([place])   . Cardiomegaly   . Shortness of breath dyspnea   . CHF (congestive heart failure) [employer])     Past Surgical History  Procedure Laterality Date  . Neck surgery     . Fracture surgery Right     right arm     Social History:  The patient  reports that he has been smoking Cigars.  He has never used smokeless tobacco. He reports that he drinks alcohol. He reports that he does not use illicit drugs.   Family History:  The patient's family history includes Cancer in his father and mother; Stroke in his maternal grandfather. There is no history of Heart attack.    ROS:  Please see the history of present illness. All other systems are reviewed and  Negative to the above problem except as noted.    PHYSICAL EXAM: VS:  BP 144/74 mmHg  Pulse 56  Ht '6\' 2"'$  (1.88 m)  Wt 223 lb 12.8 oz (101.515 kg)  BMI 28.72 kg/m2  GEN: Well nourished, well developed, in no acute distress HEENT: normal Neck: no JVD, carotid bruits, or masses Cardiac: RRR; Gr III/VI systolic murmur  rubs, or gallops,no edema  Respiratory:  clear to auscultation bilaterally, normal work of breathing GI: soft, nontender, nondistended, + BS  No hepatomegaly  MS: no deformity Moving all extremities   Skin: warm and  dry, no rash Neuro:  Strength and sensation are intact Psych: euthymic mood, full affect   EKG:  EKG is ordered today.  Atrial fib 56 bpm  T wave inversion V3 to V6, I, AVL  Olld   Lipid Panel No results found for: CHOL, TRIG, HDL, CHOLHDL, VLDL, LDLCALC, LDLDIRECT    Wt Readings from Last 3 Encounters:  12/03/15 223 lb 12.8 oz (101.515 kg)  05/04/15 224 lb (101.606 kg)  02/14/15 227 lb (102.967 kg)      ASSESSMENT AND PLAN:  1  Atrial fib  Pt not on rate control  Baseline HR low  Follow  Asymtomatic  Continue anticoagulatoin   2.  HTN  BP is OK  Keep on same meds  Get labs from [place]  3.  AS  Follow up echo in June    Encouraged him to stay active  Stay hydrated. F/U in November 2017   Signed, Dorris Carnes, MD  12/03/2015 9:31 AM    [facility] [place], [place], [place]  [address] Phone: [phone]; Fax: [phone]

## 2015-12-03 NOTE — Patient Instructions (Signed)
Your physician recommends that you continue on your current medications as directed. Please refer to the Current Medication list given to you today.  Your physician has requested that you have an echocardiogram. Echocardiography is a painless test that uses sound waves to create images of your heart. It provides your doctor with information about the size and shape of your heart and how well your heart's chambers and valves are working. This procedure takes approximately one hour. There are no restrictions for this procedure.  Your physician wants you to follow-up in: November, 2017 with Dr. Harrington Challenger.  You will receive a reminder letter in the mail two months in advance. If you don't receive a letter, please call our office to schedule the follow-up appointment.

## 2015-12-19 ENCOUNTER — Other Ambulatory Visit: Payer: Self-pay

## 2015-12-19 ENCOUNTER — Ambulatory Visit (HOSPITAL_COMMUNITY): Payer: Medicare Other | Attending: Cardiovascular Disease

## 2015-12-19 DIAGNOSIS — I352 Nonrheumatic aortic (valve) stenosis with insufficiency: Secondary | ICD-10-CM | POA: Insufficient documentation

## 2015-12-19 DIAGNOSIS — I517 Cardiomegaly: Secondary | ICD-10-CM | POA: Diagnosis not present

## 2015-12-19 DIAGNOSIS — Z720 Tobacco use: Secondary | ICD-10-CM | POA: Insufficient documentation

## 2015-12-19 DIAGNOSIS — I509 Heart failure, unspecified: Secondary | ICD-10-CM | POA: Insufficient documentation

## 2015-12-19 DIAGNOSIS — I350 Nonrheumatic aortic (valve) stenosis: Secondary | ICD-10-CM

## 2015-12-21 ENCOUNTER — Telehealth: Payer: Self-pay | Admitting: *Deleted

## 2015-12-21 NOTE — Telephone Encounter (Signed)
Pt notified of echo results by phone with verbal understanding to results given tonight.

## 2016-03-05 ENCOUNTER — Telehealth: Payer: Self-pay

## 2016-03-05 NOTE — Telephone Encounter (Signed)
Patient notified, forms for patient assistance for Xarelto signed and ready to pick up.

## 2016-07-20 NOTE — Progress Notes (Signed)
Cardiology Office Note   Date:  07/21/2016   ID:  Kevin Martin, DOB [birth year], MRN [id number]  PCP:  Berkley Harvey, NP  Cardiologist:   Dorris Carnes, MD   F/U of HTN and atrial fib     History of Present Illness: Kevin Martin is a 65 y.o. male with a history of chronic atrial fib, HTN, ETOH use , tob use and moderate AS  B blocker stopped in past due to bradycardia   I saw the pt in April 2017 NO CP  NO SOB  No dizziness      Outpatient Medications Prior to Visit  Medication Sig Dispense Refill  . amLODipine (NORVASC) 10 MG tablet Take 1 tablet (10 mg total) by mouth daily. 30 tablet 2  . folic acid (FOLVITE) 1 MG tablet Take 1 tablet (1 mg total) by mouth daily. 30 tablet 0  . furosemide (LASIX) 40 MG tablet Take 1 tablet (40 mg total) by mouth daily. 30 tablet 2  . lisinopril (PRINIVIL,ZESTRIL) 40 MG tablet Take 1 tablet (40 mg total) by mouth daily. 30 tablet 2  . Multiple Vitamin (MULTIVITAMIN WITH MINERALS) TABS tablet Take 1 tablet by mouth daily.    Marland Kitchen omega-3 acid ethyl esters (LOVAZA) 1 G capsule Take 3 g by mouth daily.     . pravastatin (PRAVACHOL) 80 MG tablet Take 1 tablet (80 mg total) by mouth daily. 30 tablet 2  . rivaroxaban (XARELTO) 20 MG TABS tablet Take 1 tablet (20 mg total) by mouth daily with supper. 30 tablet 3  . thiamine 100 MG tablet Take 1 tablet (100 mg total) by mouth daily. 30 tablet 0   No facility-administered medications prior to visit.      Allergies:   Patient has no known allergies.   Past Medical History:  Diagnosis Date  . Atrial fibrillation ([place])   . Cardiomegaly   . CHF (congestive heart failure) ([place])   . Hypertension   . Shortness of breath dyspnea     Past Surgical History:  Procedure Laterality Date  . FRACTURE SURGERY Right    right arm  . NECK SURGERY       Social History:  The patient  reports that he has been smoking Cigars.  He has smoked for the past 10.00 years. He has never used smokeless tobacco. He  reports that he drinks alcohol. He reports that he does not use drugs.   Family History:  The patient's family history includes Cancer in his father and mother; Stroke in his maternal grandfather.    ROS:  Please see the history of present illness. All other systems are reviewed and  Negative to the above problem except as noted.    PHYSICAL EXAM: VS:  BP 128/66   Pulse 65   Ht '6\' 2"'$  (1.88 m)   Wt 230 lb 3.2 oz (104.4 kg)   SpO2 97%   BMI 29.56 kg/m   GEN: Well nourished, well developed, in no acute distress HEENT: normalThroat clear   Neck: no JVD, carotid bruits, or masses  No nodes   Cardiac: RRR; Gr II/VI systolic murmur base  No  rubs, or gallops,no edema  Respiratory:  clear to auscultation bilaterally, normal work of breathing GI: soft, nontender, nondistended, + BS  No hepatomegaly  MS: no deformity Moving all extremities   Skin: warm and dry, no rash Neuro:  Strength and sensation are intact Psych: euthymic mood, full affect   EKG:  EKG is not  ordered today.   Lipid Panel No results found for: CHOL, TRIG, HDL, CHOLHDL, VLDL, LDLCALC, LDLDIRECT    Wt Readings from Last 3 Encounters:  07/21/16 230 lb 3.2 oz (104.4 kg)  12/03/15 223 lb 12.8 oz (101.5 kg)  05/04/15 224 lb (101.6 kg)      ASSESSMENT AND PLAN:  1  Aortic stenosis  Mod on echo  WIll follow clinicially forn ow    2  Atrial fib  Continue on Xarelto  Get labs from primary MD   3  HTN  Good contorl  4  Tob  Counselled on cessatoin  Down to 2 cigars per day  Has sore throat x 1 month  He is due to see Dr Ronnald Ramp on Wed  Told him to tell her  No drainage  No cough    F?U in 9 monts   Current medicines are reviewed at length with the patient today.  The patient does not have concerns regarding medicines.  Signed, Dorris Carnes, MD  07/21/2016 7:59 AM    [facility] [place], [place], [place]  [address] Phone: [phone]; Fax: [phone]

## 2016-07-21 ENCOUNTER — Ambulatory Visit (INDEPENDENT_AMBULATORY_CARE_PROVIDER_SITE_OTHER): Payer: Medicare Other | Admitting: Internal Medicine

## 2016-07-21 ENCOUNTER — Encounter (INDEPENDENT_AMBULATORY_CARE_PROVIDER_SITE_OTHER): Payer: Self-pay

## 2016-07-21 ENCOUNTER — Encounter: Payer: Self-pay | Admitting: Internal Medicine

## 2016-07-21 VITALS — BP 128/66 | HR 65 | Ht 74.0 in | Wt 230.2 lb

## 2016-07-21 DIAGNOSIS — I10 Essential (primary) hypertension: Secondary | ICD-10-CM

## 2016-07-21 DIAGNOSIS — I350 Nonrheumatic aortic (valve) stenosis: Secondary | ICD-10-CM | POA: Diagnosis not present

## 2016-07-21 DIAGNOSIS — I482 Chronic atrial fibrillation, unspecified: Secondary | ICD-10-CM

## 2016-07-21 DIAGNOSIS — I5032 Chronic diastolic (congestive) heart failure: Secondary | ICD-10-CM | POA: Diagnosis not present

## 2016-07-21 NOTE — Patient Instructions (Signed)
Medication Instructions:   Your physician recommends that you continue on your current medications as directed. Please refer to the Current Medication list given to you today.    Follow-Up:  Your physician wants you to follow-up in: [place] DR. ROSS You will receive a reminder letter in the mail two months in advance. If you don't receive a letter, please call our office to schedule the follow-up appointment.        If you need a refill on your cardiac medications before your next appointment, please call your pharmacy.

## 2016-08-28 ENCOUNTER — Other Ambulatory Visit: Payer: Self-pay | Admitting: Otolaryngology

## 2016-08-28 DIAGNOSIS — J029 Acute pharyngitis, unspecified: Secondary | ICD-10-CM

## 2016-08-28 DIAGNOSIS — C01 Malignant neoplasm of base of tongue: Secondary | ICD-10-CM

## 2016-09-03 ENCOUNTER — Telehealth: Payer: Self-pay | Admitting: Internal Medicine

## 2016-09-03 ENCOUNTER — Ambulatory Visit
Admission: RE | Admit: 2016-09-03 | Discharge: 2016-09-03 | Disposition: A | Discharge status: Home / Self Care | Payer: Medicare Other | Source: Ambulatory Visit | Attending: Otolaryngology | Admitting: Otolaryngology

## 2016-09-03 DIAGNOSIS — J029 Acute pharyngitis, unspecified: Secondary | ICD-10-CM

## 2016-09-03 DIAGNOSIS — C01 Malignant neoplasm of base of tongue: Secondary | ICD-10-CM

## 2016-09-03 MED ORDER — IOPAMIDOL (ISOVUE-300) INJECTION 61%
75.0000 mL | Freq: Once | INTRAVENOUS | Status: AC | PRN
  Administered 2016-09-03: 75 mL via INTRAVENOUS

## 2016-09-03 NOTE — Telephone Encounter (Signed)
Last seen 07/21/16 by Dr. Harrington Challenger. On Xarelto for atrial fibrillation.   Will forward to Dr. Harrington Challenger to review/advise.

## 2016-09-03 NOTE — Telephone Encounter (Signed)
OK to discontinue Xarelto Hold for 3 days prior to surgery

## 2016-09-03 NOTE — Telephone Encounter (Signed)
Request for surgical clearance:  1. What type of surgery is being performed? Direct Laryngoscopy with Biopsy,Esophagoscopy   2. When is this surgery scheduled? 09-19-16   3. Are there any medications that need to be held prior to surgery and how long?When does pt need to stop Xarelto?   4. Name of physician performing surgery? Dr Arville Care   5. What is your office phone and fax number? [phone] and fax [phone]

## 2016-09-04 NOTE — Telephone Encounter (Signed)
Faxed documentation (via EPIC) to Dr. Izora Gala, [phone].

## 2016-09-04 NOTE — Telephone Encounter (Signed)
Dr.Jefry Rosen requests cardiac clearance as well as the instructions on holding Xarelto.  Pt having direct laryngoscopy with biopsy and esophagoscopy under general anesthesia for 1 hour.      ---(Per Iran Sizer, LPN, to be faxed to         [phone])

## 2016-09-04 NOTE — Telephone Encounter (Signed)
Dr. Janeice Robinson office called.  They need documentation to include "patient is ok for surgery from cardiac standpoint" by Dr. Harrington Challenger, or they won't be able to do the biopsy.

## 2016-09-04 NOTE — Telephone Encounter (Signed)
OK to hold Xarelto a few days prior

## 2016-09-04 NOTE — Telephone Encounter (Signed)
Patient is low risk for surgery planned from cardiac standpoint  OK to proceed.

## 2016-09-09 ENCOUNTER — Ambulatory Visit: Payer: Self-pay | Admitting: Otolaryngology

## 2016-09-09 NOTE — H&P (Signed)
Kevin Martin is a 66 y.o. male who presents as a consult Patient.   Referring Provider: Randie Heinz, F*  Chief complaint: Sore throat.  HPI: Two-month history of chronic left-sided sore throat. He sometimes has pain up into the left ear as well. He has been on antibiotics, steroids, antifungals without relief. He is a longtime smoker, used to smoke one half packs per day of cigarettes per for the past several years has been smoking Swisher sweets, 4-5 daily. He also drinks several servings of vodka each evening.  PMH/Meds/All/SocHx/FamHx/ROS:   Past Medical History:  Diagnosis Date  . Abnormal heart rhythm  . Hypertension   Past Surgical History:  Procedure Laterality Date  . cervical  . WRIST SURGERY   No family history of bleeding disorders, wound healing problems or difficulty with anesthesia.   Social History   Social History  . Marital status: Married  Spouse name: N/A  . Number of children: N/A  . Years of education: N/A   Occupational History  . Not on file.   Social History Main Topics  . Smoking status: Current Every Day Smoker  Types: Cigars  . Smokeless tobacco: Never Used  . Alcohol use Not on file  . Drug use: Unknown  . Sexual activity: Not on file   Other Topics Concern  . Not on file   Social History Narrative  . No narrative on file   Current Outpatient Prescriptions:  . amLODIPine (NORVASC) 10 MG tablet, , Disp: , Rfl: 0 . folic acid (FOLVITE) 1 MG tablet, Take 1 mg by mouth., Disp: , Rfl:  . furosemide (LASIX) 40 MG tablet, take 1 tablet by mouth once daily, Disp: , Rfl: 0 . lisinopril (PRINIVIL,ZESTRIL) 40 MG tablet, , Disp: , Rfl: 0 . multivitamin (THERAGRAN) per tablet, Take 1 tablet by mouth., Disp: , Rfl:  . omega-3 acid ethyl esters (LOVAZA) 1 gram capsule, Take by mouth., Disp: , Rfl:  . pravastatin (PRAVACHOL) 80 MG tablet, take 1 tablet by mouth once daily, Disp: , Rfl: 0 . thiamine (VITAMIN B-1) 100 MG tablet, Take by mouth.,  Disp: , Rfl:  . XARELTO 20 mg tablet *ANTICOAGULANT*, , Disp: , Rfl: 0  A complete ROS was performed with pertinent positives/negatives noted in the HPI. The remainder of the ROS are negative.   Physical Exam:   There were no vitals taken for this visit.  General: Healthy and alert, in no distress, breathing easily. Normal affect. In a pleasant mood. Head: Normocephalic, atraumatic. No masses, or scars. Eyes: Pupils are equal, and reactive to light. Vision is grossly intact. No spontaneous or gaze nystagmus. Ears: Ear canals are clear. Tympanic membranes are intact, with normal landmarks and the middle ears are clear and healthy. Hearing: Grossly normal. Nose: Nasal cavities are clear with healthy mucosa, no polyps or exudate.Airways are patent. Face: No masses or scars, facial nerve function is symmetric. Oral Cavity: No mucosal abnormalities are noted. Tongue with normal mobility. Dentition appears healthy. Oropharynx: Tonsils are symmetric. There are no mucosal masses identified. Tongue base appears normal and healthy. Larynx/Hypopharynx: Unable to adequately visualize the larynx and hypopharynx with a mirror. Chest: Deferred Neck: No palpable masses, no cervical adenopathy, no thyroid nodules or enlargement. Neuro: Cranial nerves II-XII will normal function. Balance: Normal gate. Other findings: none.  Independent Review of Additional Tests or Records:  none  Procedures:  Procedure note: Flexible fiberoptic laryngoscopy  Details of the procedure were explained to the patient and all questions were answered.  Procedure:   After anesthetizing the nasal cavity with topical lidocaine and oxymetazoline, the flexible endoscope was introduced and passed through the nasal cavity into the nasopharynx. The scope was then advanced to the level of the oropharynx, then the hypopharynx and larynx.  Findings:   The posterior soft palate, uvula, tongue base and vallecula were visualized  and appeared healthy without mucosal masses or lesions, except for a mass arising in the left base of tongue that seems to extend over to the aryepiglottic fold and possibly the medial wall of the puriform although it is difficult to tell for sure. The endolarynx is not involved. The epiglottis, aryepiglottic folds, hypopharynx, supraglottis, glottis were visualized and appeared healthy without mucosal masses or lesions. Vocal fold mobility was intact and symmetric.   Additional findings: None  The scope was withdrawn from the nose. He tolerated the procedure well.   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 There is no palpable adenopathy. It does appear to extend into the area epiglottic fold, possibly into the piriform, medial side. We will need to do CT imaging to evaluate this, he will also need a chest x-ray. We discussed the importance of stopping smoking and drinking as soon as possible. We will set him up for operative endoscopy with biopsies.

## 2016-09-12 NOTE — Pre-Procedure Instructions (Signed)
Kevin Martin  09/12/2016      [facility] - [place], [place] - [address] [place] [address] Phone: [phone] Fax: [phone]  [facility] [address] - [place], [place] [address] [place] [address] Phone: [phone] Fax: [place], [address] [place] [address] Phone: [phone] Fax: [phone]    Your procedure is scheduled on January 19  Report to [place] at 1000 A.M.  Call this number if you have problems the morning of surgery:  [phone]   Remember:  Do not eat food or drink liquids after midnight.   Take these medicines the morning of surgery with A SIP OF WATER acetaminophen (TYLENOL),  amLODipine (NORVASC),   7 days prior to surgery STOP taking any Aspirin, Aleve, Naproxen, Ibuprofen, Motrin, Advil, Goody's, BC's, all herbal medications, fish oil, and all vitamins    Do not wear jewelry.  Do not wear lotions, powders, or cologne, or deoderant.   Men may shave face and neck.  Do not bring valuables to the hospital.  [facility] is not responsible for any belongings or valuables.  Contacts, dentures or bridgework may not be worn into surgery.  Leave your suitcase in the car.  After surgery it may be brought to your room.  For patients admitted to the hospital, discharge time will be determined by your treatment team.  Patients discharged the day of surgery will not be allowed to drive home.    Special instructions:   [place]- Preparing For Surgery  Before surgery, you can play an important role. Because skin is not sterile, your skin needs to be as free of germs as possible. You can reduce the number of germs on your skin by washing with CHG (chlorahexidine gluconate) Soap before surgery.  CHG is an antiseptic cleaner which kills germs and bonds with the skin  to continue killing germs even after washing.  Please do not use if you have an allergy to CHG or antibacterial soaps. If your skin becomes reddened/irritated stop using the CHG.  Do not shave (including legs and underarms) for at least 48 hours prior to first CHG shower. It is OK to shave your face.  Please follow these instructions carefully.   1. Shower the NIGHT BEFORE SURGERY and the MORNING OF SURGERY with CHG.   2. If you chose to wash your hair, wash your hair first as usual with your normal shampoo.  3. After you shampoo, rinse your hair and body thoroughly to remove the shampoo.  4. Use CHG as you would any other liquid soap. You can apply CHG directly to the skin and wash gently with a scrungie or a clean washcloth.   5. Apply the CHG Soap to your body ONLY FROM THE NECK DOWN.  Do not use on open wounds or open sores. Avoid contact with your eyes, ears, mouth and genitals (private parts). Wash genitals (private parts) with your normal soap.  6. Wash thoroughly, paying special attention to the area where your surgery will be performed.  7. Thoroughly rinse your body with warm water from the neck down.  8. DO NOT shower/wash with your normal soap after using and rinsing off the CHG Soap.  9. Pat yourself dry with a CLEAN TOWEL.   10. Wear CLEAN PAJAMAS   11. Place CLEAN SHEETS on your  bed the night of your first shower and DO NOT SLEEP WITH PETS.    Day of Surgery: Do not apply any deodorants/lotions. Please wear clean clothes to the hospital/surgery center.      Please read over the following fact sheets that you were given.

## 2016-09-15 ENCOUNTER — Encounter (HOSPITAL_COMMUNITY): Payer: Self-pay

## 2016-09-15 ENCOUNTER — Encounter (HOSPITAL_COMMUNITY)
Admission: RE | Admit: 2016-09-15 | Discharge: 2016-09-15 | Disposition: A | Discharge status: Home / Self Care | Payer: Medicare Other | Source: Ambulatory Visit | Attending: Otolaryngology | Admitting: Otolaryngology

## 2016-09-15 DIAGNOSIS — I4891 Unspecified atrial fibrillation: Secondary | ICD-10-CM | POA: Diagnosis not present

## 2016-09-15 DIAGNOSIS — Z01812 Encounter for preprocedural laboratory examination: Secondary | ICD-10-CM | POA: Insufficient documentation

## 2016-09-15 DIAGNOSIS — I10 Essential (primary) hypertension: Secondary | ICD-10-CM | POA: Diagnosis not present

## 2016-09-15 HISTORY — DX: Nonrheumatic aortic (valve) stenosis: I35.0

## 2016-09-15 LAB — COMPREHENSIVE METABOLIC PANEL
ALT: 19 U/L (ref 17–63)
AST: 21 U/L (ref 15–41)
Albumin: 3.6 g/dL (ref 3.5–5.0)
Alkaline Phosphatase: 47 U/L (ref 38–126)
Anion gap: 7 (ref 5–15)
BILIRUBIN TOTAL: 1.1 mg/dL (ref 0.3–1.2)
BUN: 14 mg/dL (ref 6–20)
CALCIUM: 9.4 mg/dL (ref 8.9–10.3)
CO2: 27 mmol/L (ref 22–32)
CREATININE: 0.84 mg/dL (ref 0.61–1.24)
Chloride: 103 mmol/L (ref 101–111)
Glucose, Bld: 125 mg/dL — ABNORMAL HIGH (ref 65–99)
Potassium: 4.4 mmol/L (ref 3.5–5.1)
Sodium: 137 mmol/L (ref 135–145)
TOTAL PROTEIN: 6.6 g/dL (ref 6.5–8.1)

## 2016-09-15 LAB — CBC
HCT: 43.4 % (ref 39.0–52.0)
Hemoglobin: 14.8 g/dL (ref 13.0–17.0)
MCH: 30.6 pg (ref 26.0–34.0)
MCHC: 34.1 g/dL (ref 30.0–36.0)
MCV: 89.7 fL (ref 78.0–100.0)
Platelets: 183 10*3/uL (ref 150–400)
RBC: 4.84 MIL/uL (ref 4.22–5.81)
RDW: 13.4 % (ref 11.5–15.5)
WBC: 9 10*3/uL (ref 4.0–10.5)

## 2016-09-15 NOTE — Progress Notes (Signed)
PCP - Eldridge Abrahams Cardiologist - Dr Dorris Carnes  Chest x-ray - not needed  EKG - 12/03/15 - abnormal Stress Test - > 10 years ECHO - 12/19/15 Cardiac Cath - denies Sleep Study - home sleep study a few months ago patient can not afford CPAP consult to case management for DOS   Fasting Blood Sugar - n/a Checks Blood Sugar __n/a___ times a day  CPAP - Can not afford but needs one  Instructed to stop Xarelto for a few days prior to surgery instructed patient to take last dose today 1/15   Patient denies shortness of breath, fever, cough and chest pain at PAT appointment   Patient verbalized understanding of instructions that was given to them at the PAT appointment. Patient expressed that there were no further questions.  Patient was also instructed that they will need to review over the PAT instructions again at home before the surgery.

## 2016-09-16 ENCOUNTER — Encounter (HOSPITAL_COMMUNITY): Payer: Self-pay

## 2016-09-16 NOTE — Progress Notes (Signed)
Anesthesia Chart Review:  Pt is a 66 year old male scheduled for direct laryngoscopy and biopsy and esophagoscopy on 09/19/2016 with Izora Gala, MD  - Cardiologist is Dorris Carnes, MD, last office visit 07/21/16, who cleared pt for surgery.  - PCP is Eldridge Abrahams, NP  PMH includes:  CHF, atrial fibrillation, aortic stenosis, HTN. Former smoker (quit 09/01/16). BMI 30  BP (!) 135/55   Pulse (!) 47   Temp 36.4 C (Oral)   Resp 18   Ht '6\' 1"'$  (1.854 m)   Wt 229 lb 4.5 oz (104 kg)   SpO2 100%   BMI 30.25 kg/m    Medications include: amlodipine, lasix, lisinopril, pravastatin, xarelto. Pt to hold xarelto 3 days before surgery.   Preoperative labs reviewed.  PT will be obtained DOS.   EKG 12/03/15: atrial fibrillation with slow ventricular response (56 bpm). T wave inversion V3 to V6, I, AVL, Old  Echo 12/19/15:  - Left ventricle: The cavity size was normal. Wall thickness was increased in a pattern of severe LVH. Systolic function was normal. The estimated ejection fraction was in the range of 55% to 60%. - Aortic valve: There was moderate stenosis. There was mild regurgitation. - Left atrium: The atrium was moderately dilated. - Right atrium: The atrium was moderately dilated. - Atrial septum: A patent foramen ovale cannot be excluded. - Pulmonary arteries: PA peak pressure: 44 mm Hg (S). - Impressions: Gradients and degree of AS similar to 2016.  If no changes, I anticipate pt can proceed with surgery as scheduled.   Willeen Cass, FNP-BC [facility] Short Stay Surgical Center/Anesthesiology Phone: [phone] 09/16/2016 4:14 PM

## 2016-09-19 ENCOUNTER — Encounter (HOSPITAL_COMMUNITY)
Admission: RE | Disposition: A | Discharge status: Home / Self Care | Payer: Self-pay | Source: Ambulatory Visit | Attending: Otolaryngology

## 2016-09-19 ENCOUNTER — Ambulatory Visit (HOSPITAL_COMMUNITY): Payer: Medicare Other | Admitting: Emergency Medicine

## 2016-09-19 ENCOUNTER — Ambulatory Visit (HOSPITAL_COMMUNITY): Payer: Medicare Other | Admitting: Anesthesiology

## 2016-09-19 ENCOUNTER — Encounter (HOSPITAL_COMMUNITY): Payer: Self-pay | Admitting: *Deleted

## 2016-09-19 ENCOUNTER — Ambulatory Visit (HOSPITAL_COMMUNITY)
Admission: RE | Admit: 2016-09-19 | Discharge: 2016-09-19 | Disposition: A | Discharge status: Home / Self Care | Payer: Medicare Other | Source: Ambulatory Visit | Attending: Otolaryngology | Admitting: Otolaryngology

## 2016-09-19 DIAGNOSIS — I110 Hypertensive heart disease with heart failure: Secondary | ICD-10-CM | POA: Diagnosis not present

## 2016-09-19 DIAGNOSIS — Z87891 Personal history of nicotine dependence: Secondary | ICD-10-CM | POA: Diagnosis not present

## 2016-09-19 DIAGNOSIS — C139 Malignant neoplasm of hypopharynx, unspecified: Secondary | ICD-10-CM | POA: Diagnosis not present

## 2016-09-19 DIAGNOSIS — J029 Acute pharyngitis, unspecified: Secondary | ICD-10-CM | POA: Insufficient documentation

## 2016-09-19 DIAGNOSIS — I509 Heart failure, unspecified: Secondary | ICD-10-CM | POA: Diagnosis not present

## 2016-09-19 DIAGNOSIS — Z85810 Personal history of malignant neoplasm of tongue: Secondary | ICD-10-CM | POA: Insufficient documentation

## 2016-09-19 DIAGNOSIS — J392 Other diseases of pharynx: Secondary | ICD-10-CM | POA: Diagnosis present

## 2016-09-19 HISTORY — PX: DIRECT LARYNGOSCOPY: SHX5326

## 2016-09-19 HISTORY — PX: ESOPHAGOSCOPY: SHX5534

## 2016-09-19 LAB — PROTIME-INR
INR: 1.09
PROTHROMBIN TIME: 14.2 s (ref 11.4–15.2)

## 2016-09-19 SURGERY — LARYNGOSCOPY, DIRECT
Anesthesia: General

## 2016-09-19 MED ORDER — LACTATED RINGERS IV SOLN
INTRAVENOUS | Status: DC
  Administered 2016-09-19 (×2): via INTRAVENOUS

## 2016-09-19 MED ORDER — ONDANSETRON HCL 4 MG/2ML IJ SOLN
INTRAMUSCULAR | Status: DC | PRN
  Administered 2016-09-19: 4 mg via INTRAVENOUS

## 2016-09-19 MED ORDER — PROPOFOL 10 MG/ML IV BOLUS
INTRAVENOUS | Status: AC
  Filled 2016-09-19: qty 20

## 2016-09-19 MED ORDER — SUCCINYLCHOLINE CHLORIDE 200 MG/10ML IV SOSY
PREFILLED_SYRINGE | INTRAVENOUS | Status: DC | PRN
  Administered 2016-09-19: 140 mg via INTRAVENOUS

## 2016-09-19 MED ORDER — MIDAZOLAM HCL 2 MG/2ML IJ SOLN
INTRAMUSCULAR | Status: AC
  Filled 2016-09-19: qty 2

## 2016-09-19 MED ORDER — LIDOCAINE 2% (20 MG/ML) 5 ML SYRINGE
INTRAMUSCULAR | Status: DC | PRN
  Administered 2016-09-19: 50 mg via INTRAVENOUS

## 2016-09-19 MED ORDER — EPINEPHRINE HCL (NASAL) 0.1 % NA SOLN
NASAL | Status: DC | PRN
  Administered 2016-09-19: 30 mL via TOPICAL

## 2016-09-19 MED ORDER — EPINEPHRINE HCL (NASAL) 0.1 % NA SOLN
NASAL | Status: AC
  Filled 2016-09-19: qty 30

## 2016-09-19 MED ORDER — PROPOFOL 10 MG/ML IV BOLUS
INTRAVENOUS | Status: DC | PRN
  Administered 2016-09-19: 20 mg via INTRAVENOUS
  Administered 2016-09-19: 200 mg via INTRAVENOUS

## 2016-09-19 MED ORDER — MIDAZOLAM HCL 5 MG/5ML IJ SOLN
INTRAMUSCULAR | Status: DC | PRN
  Administered 2016-09-19: 2 mg via INTRAVENOUS

## 2016-09-19 MED ORDER — FENTANYL CITRATE (PF) 100 MCG/2ML IJ SOLN
INTRAMUSCULAR | Status: AC
  Filled 2016-09-19: qty 4

## 2016-09-19 MED ORDER — 0.9 % SODIUM CHLORIDE (POUR BTL) OPTIME
TOPICAL | Status: DC | PRN
  Administered 2016-09-19: 1000 mL

## 2016-09-19 MED ORDER — FENTANYL CITRATE (PF) 100 MCG/2ML IJ SOLN
INTRAMUSCULAR | Status: DC | PRN
  Administered 2016-09-19: 100 ug via INTRAVENOUS
  Administered 2016-09-19 (×2): 50 ug via INTRAVENOUS

## 2016-09-19 MED ORDER — LIDOCAINE HCL (PF) 1 % IJ SOLN
INTRAMUSCULAR | Status: AC
  Filled 2016-09-19: qty 30

## 2016-09-19 MED ORDER — HYDROMORPHONE HCL 1 MG/ML IJ SOLN: 0.2500 mg | INTRAMUSCULAR | Status: DC | PRN

## 2016-09-19 MED ORDER — LIDOCAINE 2% (20 MG/ML) 5 ML SYRINGE
INTRAMUSCULAR | Status: AC
  Filled 2016-09-19: qty 5

## 2016-09-19 SURGICAL SUPPLY — 25 items
BALLN PULM 15 16.5 18 X 75CM (BALLOONS)
BALLN PULM 15 16.5 18X75 (BALLOONS)
BALLOON PULM 15 16.5 18X75 (BALLOONS) IMPLANT
BLADE SURG 15 STRL LF DISP TIS (BLADE) IMPLANT
BLADE SURG 15 STRL SS (BLADE)
CANISTER SUCTION 2500CC (MISCELLANEOUS) ×3 IMPLANT
COVER MAYO STAND STRL (DRAPES) ×3 IMPLANT
COVER TABLE BACK 60X90 (DRAPES) ×3 IMPLANT
DRAPE PROXIMA HALF (DRAPES) ×3 IMPLANT
GAUZE SPONGE 4X4 12PLY STRL (GAUZE/BANDAGES/DRESSINGS) ×3 IMPLANT
GAUZE SPONGE 4X4 16PLY XRAY LF (GAUZE/BANDAGES/DRESSINGS) ×3 IMPLANT
GLOVE ECLIPSE 7.5 STRL STRAW (GLOVE) ×3 IMPLANT
KIT BASIN OR (CUSTOM PROCEDURE TRAY) ×3 IMPLANT
KIT ROOM TURNOVER OR (KITS) ×3 IMPLANT
NDL PRECISIONGLIDE 27X1.5 (NEEDLE) IMPLANT
NEEDLE PRECISIONGLIDE 27X1.5 (NEEDLE) IMPLANT
NS IRRIG 1000ML POUR BTL (IV SOLUTION) ×3 IMPLANT
PAD ARMBOARD 7.5X6 YLW CONV (MISCELLANEOUS) ×6 IMPLANT
PATTIES SURGICAL .5 X3 (DISPOSABLE) ×3 IMPLANT
SPECIMEN JAR SMALL (MISCELLANEOUS) ×2 IMPLANT
SYR CONTROL 10ML LL (SYRINGE) IMPLANT
TOWEL OR 17X24 6PK STRL BLUE (TOWEL DISPOSABLE) ×3 IMPLANT
TOWEL OR 17X26 10 PK STRL BLUE (TOWEL DISPOSABLE) ×3 IMPLANT
TUBE CONNECTING 12'X1/4 (SUCTIONS) ×1
TUBE CONNECTING 12X1/4 (SUCTIONS) ×2 IMPLANT

## 2016-09-19 NOTE — Anesthesia Procedure Notes (Addendum)
Procedure Name: Intubation Date/Time: 09/19/2016 12:27 PM Performed by: Melina Copa, Laurance Heide R Pre-anesthesia Checklist: Patient identified, Emergency Drugs available, Suction available and Patient being monitored Patient Re-evaluated:Patient Re-evaluated prior to inductionOxygen Delivery Method: Circle System Utilized Preoxygenation: Pre-oxygenation with 100% oxygen Intubation Type: IV induction Ventilation: Mask ventilation without difficulty Laryngoscope Size: Mac and 4 Grade View: Grade II Tube type: Oral Tube size: 6.0 mm Number of attempts: 1 Airway Equipment and Method: Stylet Placement Confirmation: ETT inserted through vocal cords under direct vision,  positive ETCO2 and breath sounds checked- equal and bilateral Secured at: 22 cm Tube secured with: Tape Dental Injury: Teeth and Oropharynx as per pre-operative assessment

## 2016-09-19 NOTE — Anesthesia Preprocedure Evaluation (Addendum)
Anesthesia Evaluation  Patient identified by MRN, date of birth, ID band Patient awake    Reviewed: Allergy & Precautions, NPO status , Patient's Chart, lab work & pertinent test results  Airway Mallampati: II  TM Distance: >3 FB Neck ROM: Full    Dental  (+) Edentulous Upper, Edentulous Lower, Dental Advisory Given   Pulmonary shortness of breath, former smoker,    breath sounds clear to auscultation       Cardiovascular hypertension, Pt. on medications +CHF   Rhythm:Regular Rate:Normal     Neuro/Psych    GI/Hepatic negative GI ROS, Neg liver ROS,   Endo/Other  negative endocrine ROS  Renal/GU negative Renal ROS     Musculoskeletal   Abdominal   Peds  Hematology   Anesthesia Other Findings   Reproductive/Obstetrics                            Anesthesia Physical Anesthesia Plan  ASA: III  Anesthesia Plan: General   Post-op Pain Management:    Induction: Intravenous  Airway Management Planned: Oral ETT  Additional Equipment: None  Intra-op Plan:   Post-operative Plan: Extubation in OR  Informed Consent: I have reviewed the patients History and Physical, chart, labs and discussed the procedure including the risks, benefits and alternatives for the proposed anesthesia with the patient or authorized representative who has indicated his/her understanding and acceptance.   Dental advisory given  Plan Discussed with: CRNA, Anesthesiologist and Surgeon  Anesthesia Plan Comments:         Anesthesia Quick Evaluation

## 2016-09-19 NOTE — Transfer of Care (Signed)
Immediate Anesthesia Transfer of Care Note  Patient: Kevin Martin  Procedure(s) Performed: Procedure(s): DIRECT LARYNGOSCOPY with BIOPSY (N/A) ESOPHAGOSCOPY (N/A)  Patient Location: PACU  Anesthesia Type:General  Level of Consciousness: patient cooperative and responds to stimulation  Airway & Oxygen Therapy: Patient Spontanous Breathing and Patient connected to face mask oxygen  Post-op Assessment: Report given to RN and Post -op Vital signs reviewed and stable  Post vital signs: Reviewed and stable  Last Vitals:  Vitals:   09/19/16 0956  BP: (!) 143/55  Pulse: (!) 46  Resp: 20  Temp: 36.6 C    Last Pain:  Vitals:   09/19/16 0956  TempSrc: Oral         Complications: No apparent anesthesia complications

## 2016-09-19 NOTE — Op Note (Signed)
OPERATIVE REPORT  DATE OF SURGERY: 09/19/2016  PATIENT:  Kevin Martin,  66 y.o. male  PRE-OPERATIVE DIAGNOSIS:  HISTORY OF CANCER BASE OF TONGUE  POST-OPERATIVE DIAGNOSIS:  HISTORY OF CANCER BASE OF TONGUE  PROCEDURE:  Procedure(s): DIRECT LARYNGOSCOPY AND BIOPSY AND ESOPHAGOSCOPY ESOPHAGOSCOPY  SURGEON:  Beckie Salts, MD  ASSISTANTS: none  ANESTHESIA:   General   EBL:  5 ml  DRAINS: none  LOCAL MEDICATIONS USED:  None  SPECIMEN:  Biopsy left hypopharyngeal mass  COUNTS:  Correct  PROCEDURE DETAILS: The patient was taken to the operating room and placed on the operating table in the supine position. Following induction of general endotracheal anesthesia, the table was turned 90 and the patient was draped in a standard fashion.  1. Esophagoscopy. It cervical esophagoscope was entered into the oral cavity and passed through the cricopharyngeal opening. Scope was advanced as far as it could go and then slowly withdrawn while suctioning secretions and carefully inspecting circumferentially around the esophageal mucosa. The scope was advanced all the way out and there were no lesions identified.  2. Direct laryngoscopy with biopsy. The Jako laryngoscope was used to evaluate the hypopharynx and larynx. There is an exophytic slightly friable mass involving the left area epiglottic fold, medial wall of the piriform sinus, and with extension to the apex. The lateral wall did not appear to be involved. The tongue base was not involved but there may have been slight involvement of the vallecula inferiorly. It did not cross the midline. The esophageal introitus was clear. The remainder of the larynx and hypopharynx was all without lesions. No biopsies were taken from this mass and sent for pathologic evaluation. There was minimal bleeding. The scope was removed and the patient was then awakened, extubated and transferred to recovery in stable condition.    PATIENT DISPOSITION:  To PACU,  stable

## 2016-09-19 NOTE — Interval H&P Note (Signed)
History and Physical Interval Note:  09/19/2016 11:48 AM  Kevin Martin  has presented today for surgery, with the diagnosis of HISTORY OF CANCER BASE OF TONGUE  The various methods of treatment have been discussed with the patient and family. After consideration of risks, benefits and other options for treatment, the patient has consented to  Procedure(s): DIRECT LARYNGOSCOPY AND BIOPSY AND ESOPHAGOSCOPY (N/A) ESOPHAGOSCOPY (N/A) as a surgical intervention .  The patient's history has been reviewed, patient examined, no change in status, stable for surgery.  I have reviewed the patient's chart and labs.  Questions were answered to the patient's satisfaction.     Ephrem Carrick

## 2016-09-19 NOTE — Anesthesia Postprocedure Evaluation (Addendum)
Anesthesia Post Note  Patient: Kevin Martin  Procedure(s) Performed: Procedure(s) (LRB): DIRECT LARYNGOSCOPY with BIOPSY (N/A) ESOPHAGOSCOPY (N/A)  Patient location during evaluation: PACU Anesthesia Type: General Level of consciousness: sedated Pain management: pain level controlled Vital Signs Assessment: post-procedure vital signs reviewed and stable Respiratory status: spontaneous breathing and respiratory function stable Cardiovascular status: stable Anesthetic complications: no       Last Vitals:  Vitals:   09/19/16 1341 09/19/16 1345  BP: (!) 114/52   Pulse: (!) 43 (!) 42  Resp: 14 12  Temp:      Last Pain:  Vitals:   09/19/16 1325  TempSrc:   PainSc: 0-No pain                 [name] DANIEL

## 2016-09-19 NOTE — H&P (View-Only) (Signed)
Kevin Martin is a 66 y.o. male who presents as a consult Patient.   Referring Provider: Randie Heinz, F*  Chief complaint: Sore throat.  HPI: Two-month history of chronic left-sided sore throat. He sometimes has pain up into the left ear as well. He has been on antibiotics, steroids, antifungals without relief. He is a longtime smoker, used to smoke one half packs per day of cigarettes per for the past several years has been smoking Swisher sweets, 4-5 daily. He also drinks several servings of vodka each evening.  PMH/Meds/All/SocHx/FamHx/ROS:   Past Medical History:  Diagnosis Date  . Abnormal heart rhythm  . Hypertension   Past Surgical History:  Procedure Laterality Date  . cervical  . WRIST SURGERY   No family history of bleeding disorders, wound healing problems or difficulty with anesthesia.   Social History   Social History  . Marital status: Married  Spouse name: N/A  . Number of children: N/A  . Years of education: N/A   Occupational History  . Not on file.   Social History Main Topics  . Smoking status: Current Every Day Smoker  Types: Cigars  . Smokeless tobacco: Never Used  . Alcohol use Not on file  . Drug use: Unknown  . Sexual activity: Not on file   Other Topics Concern  . Not on file   Social History Narrative  . No narrative on file   Current Outpatient Prescriptions:  . amLODIPine (NORVASC) 10 MG tablet, , Disp: , Rfl: 0 . folic acid (FOLVITE) 1 MG tablet, Take 1 mg by mouth., Disp: , Rfl:  . furosemide (LASIX) 40 MG tablet, take 1 tablet by mouth once daily, Disp: , Rfl: 0 . lisinopril (PRINIVIL,ZESTRIL) 40 MG tablet, , Disp: , Rfl: 0 . multivitamin (THERAGRAN) per tablet, Take 1 tablet by mouth., Disp: , Rfl:  . omega-3 acid ethyl esters (LOVAZA) 1 gram capsule, Take by mouth., Disp: , Rfl:  . pravastatin (PRAVACHOL) 80 MG tablet, take 1 tablet by mouth once daily, Disp: , Rfl: 0 . thiamine (VITAMIN B-1) 100 MG tablet, Take by mouth.,  Disp: , Rfl:  . XARELTO 20 mg tablet *ANTICOAGULANT*, , Disp: , Rfl: 0  A complete ROS was performed with pertinent positives/negatives noted in the HPI. The remainder of the ROS are negative.   Physical Exam:   There were no vitals taken for this visit.  General: Healthy and alert, in no distress, breathing easily. Normal affect. In a pleasant mood. Head: Normocephalic, atraumatic. No masses, or scars. Eyes: Pupils are equal, and reactive to light. Vision is grossly intact. No spontaneous or gaze nystagmus. Ears: Ear canals are clear. Tympanic membranes are intact, with normal landmarks and the middle ears are clear and healthy. Hearing: Grossly normal. Nose: Nasal cavities are clear with healthy mucosa, no polyps or exudate.Airways are patent. Face: No masses or scars, facial nerve function is symmetric. Oral Cavity: No mucosal abnormalities are noted. Tongue with normal mobility. Dentition appears healthy. Oropharynx: Tonsils are symmetric. There are no mucosal masses identified. Tongue base appears normal and healthy. Larynx/Hypopharynx: Unable to adequately visualize the larynx and hypopharynx with a mirror. Chest: Deferred Neck: No palpable masses, no cervical adenopathy, no thyroid nodules or enlargement. Neuro: Cranial nerves II-XII will normal function. Balance: Normal gate. Other findings: none.  Independent Review of Additional Tests or Records:  none  Procedures:  Procedure note: Flexible fiberoptic laryngoscopy  Details of the procedure were explained to the patient and all questions were answered.  Procedure:   After anesthetizing the nasal cavity with topical lidocaine and oxymetazoline, the flexible endoscope was introduced and passed through the nasal cavity into the nasopharynx. The scope was then advanced to the level of the oropharynx, then the hypopharynx and larynx.  Findings:   The posterior soft palate, uvula, tongue base and vallecula were visualized  and appeared healthy without mucosal masses or lesions, except for a mass arising in the left base of tongue that seems to extend over to the aryepiglottic fold and possibly the medial wall of the puriform although it is difficult to tell for sure. The endolarynx is not involved. The epiglottis, aryepiglottic folds, hypopharynx, supraglottis, glottis were visualized and appeared healthy without mucosal masses or lesions. Vocal fold mobility was intact and symmetric.   Additional findings: None  The scope was withdrawn from the nose. He tolerated the procedure well.   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 There is no palpable adenopathy. It does appear to extend into the area epiglottic fold, possibly into the piriform, medial side. We will need to do CT imaging to evaluate this, he will also need a chest x-ray. We discussed the importance of stopping smoking and drinking as soon as possible. We will set him up for operative endoscopy with biopsies.

## 2016-09-20 ENCOUNTER — Encounter (HOSPITAL_COMMUNITY): Payer: Self-pay | Admitting: Otolaryngology

## 2016-09-25 ENCOUNTER — Encounter: Payer: Self-pay | Admitting: Radiation Oncology

## 2016-09-25 ENCOUNTER — Telehealth: Payer: Self-pay | Admitting: Hematology and Oncology

## 2016-09-25 ENCOUNTER — Telehealth: Payer: Self-pay | Admitting: *Deleted

## 2016-09-25 ENCOUNTER — Encounter: Payer: Self-pay | Admitting: Hematology and Oncology

## 2016-09-25 ENCOUNTER — Other Ambulatory Visit (HOSPITAL_COMMUNITY): Payer: Self-pay | Admitting: Otolaryngology

## 2016-09-25 DIAGNOSIS — C329 Malignant neoplasm of larynx, unspecified: Secondary | ICD-10-CM

## 2016-09-25 NOTE — Telephone Encounter (Signed)
Oncology Nurse Navigator Documentation  Received call from Kevin Martin SO.  Introduced myself as the H&N oncology nurse navigator that works with Drs. Isidore Moos and Alvy Bimler to whom he has been referred by Dr. Constance Holster.  She confirmed understanding of referral and appt times for 2/6 PET and 2/7 consults.  I briefly explained my role as his navigator, indicated that I would be joining them during his appts.  I provided my contact information, encouraged them to call with questions/concerns before appts.  She verbalized understanding of information provided, expressed appreciation for my call.  Gayleen Orem, RN, BSN, [place] On[facility] Nurse [place] at [place] [phone]

## 2016-09-25 NOTE — Telephone Encounter (Signed)
Oncology Nurse Navigator Documentation  Placed new referral introductory call to Kevin Martin, [facility] requesting call back.  Gayleen Orem, RN, BSN, [place] Oncology Nurse [place] at [place] [phone]

## 2016-09-25 NOTE — Telephone Encounter (Signed)
An appt has beens scheduled for the pt to see Dr. Alvy Bimler on 2/7 '@10am'$ . Left a vm on Sharon's phone, LPN for Dr. Constance Holster to contact the pt. Will mail the pt a letter with the appt date and time.

## 2016-10-02 NOTE — Progress Notes (Signed)
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  Patient presented  months ago with symptoms of: He presented to Dr. Constance Holster on 08/28/16 with a Two month history of chronic Left sided sore throat. He also reported pain up to his left ear.   Biopsies of Left Pharynx revealed: Invasive Squamous Cell Carcinoma  Nutrition Status Yes No Comments  Weight changes? '[]'$  '[x]'$    Swallowing concerns? '[]'$  '[x]'$    PEG? '[]'$  '[x]'$     Referrals Yes No Comments  Social Work? '[]'$  '[x]'$    Dentistry? '[]'$  '[x]'$    Swallowing therapy? '[]'$  '[x]'$    Nutrition? '[]'$  '[x]'$    Med/Onc? '[x]'$  '[]'$  Dr. Alvy Bimler today at 10:00   Safety Issues Yes No Comments  Prior radiation? '[]'$  '[x]'$    Pacemaker/ICD? '[]'$  '[x]'$    Possible current pregnancy? '[]'$  '[x]'$    Is the patient on methotrexate? '[]'$  '[x]'$     Tobacco/Marijuana/Snuff/ETOH use: He is a former smoker. He quit 09/01/2016. He reports a daily consumption of 3-4 mixed drinks.   Past/Anticipated interventions by otolaryngology, if any:  Dr. Constance Holster PROCEDURE:  Procedure(s): DIRECT LARYNGOSCOPY AND BIOPSY AND ESOPHAGOSCOPY ESOPHAGOSCOPY  SURGEON:  Beckie Salts, MD   Past/Anticipated interventions by medical oncology, if any:  Dr. Alvy Bimler today at 10:00     Current Complaints / other details:   PET 10/07/16 IMPRESSION: 1. Left lateral pharyngeal mass has a maximum standard uptake value of 15.1. There are 2 involved adjacent left IIa lymph nodes. No other hypermetabolic lesions identified. 2. Scattered small pulmonary nodules, below sensitive PET-CT size thresholds. Given the degree of motion artifact on the lung sequence, I would recommend a dedicated chest CT in order to provide a baseline accurate measurement for these nodules so that they can be followed up by size. I am skeptical that these are metastatic but surveillance is likely warranted. 3. Other imaging findings of potential clinical  significance: Atherosclerosis including the carotid bulbs, aortic arch, coronary arteries, and aortoiliac tree. Cardiomegaly. Right hydrocele. Asymmetric facet arthropathy on the right at C2-3 which is somewhat hypermetabolic but ascribed to degenerative or erosive arthropathy.  BP (!) 145/60   Pulse (!) 57   Temp 98.2 F (36.8 C)   Ht '6\' 1"'$  (1.854 m)   Wt 229 lb 6.4 oz (104.1 kg)   SpO2 97% Comment: room air  BMI 30.27 kg/m    Wt Readings from Last 3 Encounters:  10/08/16 229 lb 6.4 oz (104.1 kg)  09/19/16 229 lb 4.5 oz (104 kg)  09/15/16 229 lb 4.5 oz (104 kg)

## 2016-10-03 NOTE — Progress Notes (Signed)
Radiation Oncology         [phone] ________________________________  Initial Outpatient Consultation  Name: TELLER WAKEFIELD MRN: [id number]  Date: 10/08/2016  DOB: 1951/03/19  VV:OHYWV, Sherrill Raring, NP  Izora Gala, MD   REFERRING PHYSICIAN: Izora Gala, MD  DIAGNOSIS:    ICD-9-CM ICD-10-CM   1. 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 male who presented to Dr. Constance Holster on 08/28/16 with a 2 month history of chronic left-sided sore throat, with occasional pain up into the left ear as well. He was on antibiotics, steroids, antifungals without relief. Laryngoscopy was performed revealing a mass arising in the left base of the tongue that seemed to extend over to the aryepiglottic fold and possibly the medial wall of the piriform, although it was non distinct. The endolarynx was not involved.  Biopsy of left hypopharyngeal mass on 09/19/16 revealed: invasive squamous cell carcinoma.  Pertinent imaging thus far includes neck CT performed on 09/03/16 revealing an ulcerated appearing mass with lepidic growth pattern at the left involving the ventral surface of the epiglottis and possibly also the left AE fold. The tumor was estimated to be up to 3.5 cm in size. Additionally, a small but malignant-appearing left level IIIA lymph node was noted, as well as a nearby suspicious 10 mm level IIA lymph node. There is an indeterminate left upper lobe lung nodule measuring approximately 3 mm, favored to be postinflammatory.  PET scan reviewed as well in tumor board today.  He appears to have T2N2b disease.  Recommendation for CT chest to rule out lung mets - there was motion artifact on the PET images and  nonspecific nodules.  On review of systems, the patient denies any weight changes. He has no swallowing concerns. The patient does not have a PEG. The patient reports he has sleep apnea, and has some trouble breathing when he lays down. He has CHF. He denies any nausea or vomiting. He denies any bowel or bladder concerns.  The patient reports to the clinic today with his wife to discuss the role that radiation may play in the treatment of his disease. We were joined in consultation today by Gayleen Orem, RN, Head and Neck Nurse Navigator.  The patient is scheduled to meet with Dr. Alvy Bimler today at 10 am.  He likes to gold in his free time. He drinks 3-4 drinks / day and quit smoking a month ago.   PREVIOUS RADIATION THERAPY: No  PAST MEDICAL HISTORY:  has a past medical history of Aortic stenosis; Atrial fibrillation ([place]); Cardiomegaly; CHF (congestive heart failure) ([place]); Hypertension; and Shortness of breath dyspnea.    PAST SURGICAL HISTORY: Past Surgical History:  Procedure Laterality Date  . COLONOSCOPY    . DIRECT LARYNGOSCOPY N/A 09/19/2016   Procedure: DIRECT LARYNGOSCOPY with BIOPSY;  Surgeon: Izora Gala, MD;  Location: [place];  Service: ENT;  Laterality: N/A;  . ESOPHAGOSCOPY N/A 09/19/2016   Procedure: ESOPHAGOSCOPY;  Surgeon: Izora Gala, MD;  Location: [place];  Service: ENT;  Laterality: N/A;  . FRACTURE SURGERY Right    right arm  . NECK SURGERY      FAMILY HISTORY: family history includes Cancer in his father and mother;  Stroke in his maternal grandfather.  SOCIAL HISTORY:  reports that he quit smoking about 5 weeks ago. His smoking use included Cigars and Cigarettes. He quit after 10.00 years of use. He has never used smokeless tobacco. He reports that he drinks alcohol. He reports that he does not use drugs. The patient reports a daily consumption of 3-4 mixed drinks. The patient quit smoking 09/01/16. The patient and his wife reside in [place]. The patient enjoys playing  golf every week. 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  ALLERGIES: Patient has no known allergies.  MEDICATIONS:  Current Outpatient Prescriptions  Medication Sig Dispense Refill  . acetaminophen (TYLENOL) 500 MG tablet Take 1,000 mg by mouth every 6 (six) hours as needed for mild pain.    Marland Kitchen amLODipine (NORVASC) 10 MG tablet Take 1 tablet (10 mg total) by mouth daily. 30 tablet 2  . folic acid (FOLVITE) 465 MCG tablet Take 400 mcg by mouth daily.    . furosemide (LASIX) 40 MG tablet Take 1 tablet (40 mg total) by mouth daily. 30 tablet 2  . lisinopril (PRINIVIL,ZESTRIL) 40 MG tablet Take 1 tablet (40 mg total) by mouth daily. 30 tablet 2  . Multiple Vitamin (MULTIVITAMIN WITH MINERALS) TABS tablet Take 1 tablet by mouth daily. 50+ for Men    . omega-3 acid ethyl esters (LOVAZA) 1 G capsule Take 3 g by mouth daily.     . pravastatin (PRAVACHOL) 80 MG tablet Take 1 tablet (80 mg total) by mouth daily. (Patient taking differently: Take 80 mg by mouth every evening. ) 30 tablet 2  . rivaroxaban (XARELTO) 20 MG TABS tablet Take 1 tablet (20 mg total) by mouth daily with supper. 30 tablet 3  . thiamine 100 MG tablet Take 1 tablet (100 mg total) by mouth daily. 30 tablet 0  . HYDROcodone-acetaminophen (HYCET) 7.5-325 mg/15 ml solution Take by mouth.     No current facility-administered medications for this encounter.     REVIEW OF SYSTEMS:  A 10+ POINT REVIEW OF SYSTEMS WAS OBTAINED including neurology, dermatology, psychiatry, cardiac, respiratory, lymph, extremities, GI, GU, Musculoskeletal, constitutional, HEENT.  All pertinent positives are noted in the HPI.  All others are negative.   PHYSICAL EXAM:  height is '6\' 1"'$  (1.854 m) and weight is 229 lb 6.4 oz (104.1 kg). His temperature is 98.2 F (36.8 C). His blood pressure is 145/60 (abnormal) and his pulse is 57 (abnormal). His oxygen saturation is 97%.   General:  Alert and oriented, in no acute distress. HEENT: Extraocular movements are intact. Oropharynx and oral cavity are clear. The patient is edentulous. Neck: No palpable lymphadenopathy in the cervical or supraclavicular regions. Heart: Heart is slightly bradycardic, with systolic murmur heard throughout pericardium. Chest: Clear to auscultation bilaterally. Abdomen: Soft, nontender, with normal active bowel sounds. Extremities: Mild pitting edema in lower extremities bilaterally. Lymphatics: see Neck Exam Skin: No concerning lesions. Musculoskeletal: symmetric strength throughout.  Neurologic: No obvious focalities. Speech is fluent. Coordination is intact. Psychiatric: Judgment and insight are intact. Affect is appropriate.  PROCEDURE NOTE: After obtaining consent and anesthetizing the nasal cavity with topical lidocaine and phenylephrine, the flexible endoscope was introduced and passed through the nasal cavity. On examination it was noted that anterior to the epiglottis there is a mass that appears to extend from left lateral pharyngeal wall into piriform sinus. It is exophytic and it crossed the midline. There appears  to be some superficial blood on the tumor. True Cords are symmetrically mobile. Left aryepiglottic fold is swollen and appears to be involved by tumor, however the true vocal cords do not appear to be involved by tumor.   ECOG = 1  0 - Asymptomatic (Fully active, able to carry on all predisease activities without restriction)  1 - Symptomatic but completely ambulatory (Restricted in physically strenuous activity but ambulatory and able to carry out work of a light or sedentary nature. For example, light housework, office work)  2 - Symptomatic, <50% in bed during the day (Ambulatory and capable of all self care but unable to carry out any work activities. Up and about more than 50% of waking hours)  3 - Symptomatic, >50% in bed, but not bedbound (Capable of only limited  self-care, confined to bed or chair 50% or more of waking hours)  4 - Bedbound (Completely disabled. Cannot carry on any self-care. Totally confined to bed or chair)  5 - Death   Eustace Pen MM, Creech RH, Tormey DC, et al. [phone]). "Toxicity and response criteria of the [facility] Group". [place] Oncol. 5 (6): 649-55   LABORATORY DATA:  Lab Results  Component Value Date   WBC 9.0 09/15/2016   HGB 14.8 09/15/2016   HCT 43.4 09/15/2016   MCV 89.7 09/15/2016   PLT 183 09/15/2016   CMP     Component Value Date/Time   NA 137 09/15/2016 0819   K 4.4 09/15/2016 0819   CL 103 09/15/2016 0819   CO2 27 09/15/2016 0819   GLUCOSE 125 (H) 09/15/2016 0819   BUN 14 09/15/2016 0819   CREATININE 0.84 09/15/2016 0819   CALCIUM 9.4 09/15/2016 0819   PROT 6.6 09/15/2016 0819   ALBUMIN 3.6 09/15/2016 0819   AST 21 09/15/2016 0819   ALT 19 09/15/2016 0819   ALKPHOS 47 09/15/2016 0819   BILITOT 1.1 09/15/2016 0819   GFRNONAA >60 09/15/2016 0819   GFRAA >60 09/15/2016 0819         RADIOGRAPHY-- images reviewed by me: Nm Pet Image Initial (pi) Skull Base To Thigh  Result Date: 10/07/2016 CLINICAL DATA:  Initial treatment strategy for laryngeal cancer. EXAM: NUCLEAR MEDICINE PET SKULL BASE TO THIGH TECHNIQUE: 11.6 mCi F-18 FDG was injected intravenously. Full-ring PET imaging was performed from the skull base to thigh after the radiotracer. CT data was obtained and used for attenuation correction and anatomic localization. FASTING BLOOD GLUCOSE:  Value: 123 mg/dl COMPARISON:  CT scan 09/03/2016 FINDINGS: NECK Left pharyngeal wall mass extending down along the piriform sinus along the left vallecula and effacing the left piriform sinus, maximum SUV 15.1. The there are 2 adjacent level IIa lymph nodes, the more superior measuring 1.1 cm in short axis on image 33/4 with maximum SUV of 9.0, and the more caudad slightly obscured but measuring about 9 mm in short axis on image 37/4 with  maximum SUV 8.2. Atherosclerotic calcification of the carotid bulbs. CHEST No hypermetabolic mediastinal or hilar nodes. 0.5 by 0.6 cm right upper lobe nodule image 7/7, no corresponding hypermetabolic activity. Indistinct 7 mm nodule in the right upper lobe on image 14/7 common no corresponding hypermetabolic activity. Multiple additional scattered small nodules are present in both lungs, somewhat indistinct margins, not appreciably hypermetabolic. These are blurred by the significant degree of motion artifact. These are below sensitive PET-CT size thresholds. Coronary, aortic arch, and branch vessel atherosclerotic vascular disease. Mild cardiomegaly. ABDOMEN/PELVIS No abnormal hypermetabolic activity within the  liver, pancreas, adrenal glands, or spleen. No hypermetabolic lymph nodes in the abdomen or pelvis. Aortoiliac atherosclerotic vascular disease.  Right hydrocele. SKELETON Note is made of high activity at the right C2- 3 facet joint, maximum SUV 8.1, attributed to degenerative facet arthropathy. IMPRESSION: 1. Left lateral pharyngeal mass has a maximum standard uptake value of 15.1. There are 2 involved adjacent left IIa lymph nodes. No other hypermetabolic lesions identified. 2. Scattered small pulmonary nodules, below sensitive PET-CT size thresholds. Given the degree of motion artifact on the lung sequence, I would recommend a dedicated chest CT in order to provide a baseline accurate measurement for these nodules so that they can be followed up by size. I am skeptical that these are metastatic but surveillance is likely warranted. 3. Other imaging findings of potential clinical significance: Atherosclerosis including the carotid bulbs, aortic arch, coronary arteries, and aortoiliac tree. Cardiomegaly. Right hydrocele. Asymmetric facet arthropathy on the right at C2-3 which is somewhat hypermetabolic but ascribed to degenerative or erosive arthropathy. Electronically Signed   By: Van Clines M.D.    On: 10/07/2016 13:09      IMPRESSION/PLAN: This is a delightful patient with head and neck cancer. I would recommend 7 weeks of definitive radiotherapy for this patient. First, will rule out lung mets with a CT of the chest.  We discussed the potential risks, benefits, and side effects of radiotherapy. We talked in detail about acute and late effects. We discussed that some of the most bothersome acute effects may be mucositis, dysgeusia, salivary changes, skin irritation, hair loss, dehydration, weight loss and fatigue. We talked about late effects which include but are not necessarily limited to dysphagia, hypothyroidism, nerve injury, spinal cord injury, xerostomia,  and neck edema. No guarantees of treatment were given. A consent form was signed and placed in the patient's medical record. The patient is enthusiastic about proceeding with treatment. I look forward to participating in the patient's care.    CT Simulation (treatment planning) will take place next week after CT of chest wo contrast.  We also discussed that the treatment of head and neck cancer is a multidisciplinary process to maximize treatment outcomes and quality of life. For this reasons the following referrals have been or will be made:  1) Nutritionist for nutrition support during and after treatment.  2) Speech language pathology for swallowing and/or speech therapy.  3) Social work for social support.   4) Physical therapy due to risk of lymphedema in neck and deconditioning.  5) Baseline labs including TSH.  The patient reports he has an upcoming trip 11/06/16 - 11/09/16, a Christmas gift from his wife, which would conflict with his radiation therapy. I encouraged the patient to see how he feels during the first few weeks of treatment before cancelling his trip. I am happy to support the patient in working with his schedule, provided he is doing well with treatment. He will get treatment on the AM of 3-8 before leaving  for the trip.  I counseled the patient about his smoking and alcohol use. I encouraged the patient that his smoking cessation was a very positive step which would increase the effectiveness of the radiation therapy. We discussed his current alcohol use and I encouraged the patient to be aware of his alcohol use and educated him on the risks that consuming alcohol could add to the patient's current health problems. I asked the patient to discuss this with social work for recommendations on healthy activities that could diminish the  patient's stress without alcohol use.   The patient is scheduled to meet with Dr. Alvy Bimler today at 10 am.  Additionally, as above,  I would like the patient to undergo chest CT with contrast to verify his lungs are clear of disease because of the patient's throat cancer and smoking history. The patient is in agreement and will be scheduled ASAP.  __________________________________________   Eppie Gibson, MD  This document serves as a record of services personally performed by Eppie Gibson, MD. It was created on her behalf by Maryla Morrow, a trained medical scribe. The creation of this record is based on the scribe's personal observations and the provider's statements to them. This document has been checked and approved by the attending provider.

## 2016-10-07 ENCOUNTER — Ambulatory Visit (HOSPITAL_COMMUNITY)
Admission: RE | Admit: 2016-10-07 | Discharge: 2016-10-07 | Disposition: A | Discharge status: Home / Self Care | Payer: Medicare Other | Source: Ambulatory Visit | Attending: Otolaryngology | Admitting: Otolaryngology

## 2016-10-07 DIAGNOSIS — C329 Malignant neoplasm of larynx, unspecified: Secondary | ICD-10-CM | POA: Diagnosis present

## 2016-10-07 DIAGNOSIS — I517 Cardiomegaly: Secondary | ICD-10-CM | POA: Diagnosis not present

## 2016-10-07 DIAGNOSIS — I708 Atherosclerosis of other arteries: Secondary | ICD-10-CM | POA: Insufficient documentation

## 2016-10-07 DIAGNOSIS — I2510 Atherosclerotic heart disease of native coronary artery without angina pectoris: Secondary | ICD-10-CM | POA: Insufficient documentation

## 2016-10-07 DIAGNOSIS — I700 Atherosclerosis of aorta: Secondary | ICD-10-CM | POA: Diagnosis not present

## 2016-10-07 DIAGNOSIS — R918 Other nonspecific abnormal finding of lung field: Secondary | ICD-10-CM | POA: Insufficient documentation

## 2016-10-07 DIAGNOSIS — N433 Hydrocele, unspecified: Secondary | ICD-10-CM | POA: Insufficient documentation

## 2016-10-07 DIAGNOSIS — M1288 Other specific arthropathies, not elsewhere classified, other specified site: Secondary | ICD-10-CM | POA: Diagnosis not present

## 2016-10-07 LAB — GLUCOSE, CAPILLARY: GLUCOSE-CAPILLARY: 123 mg/dL — AB (ref 65–99)

## 2016-10-07 MED ORDER — FLUDEOXYGLUCOSE F - 18 (FDG) INJECTION
11.5500 | Freq: Once | INTRAVENOUS | Status: AC | PRN
  Administered 2016-10-07: 11.55 via INTRAVENOUS

## 2016-10-08 ENCOUNTER — Encounter: Payer: Self-pay | Admitting: *Deleted

## 2016-10-08 ENCOUNTER — Ambulatory Visit (HOSPITAL_BASED_OUTPATIENT_CLINIC_OR_DEPARTMENT_OTHER): Payer: Medicare Other | Admitting: Hematology and Oncology

## 2016-10-08 ENCOUNTER — Encounter: Payer: Self-pay | Admitting: Hematology and Oncology

## 2016-10-08 ENCOUNTER — Telehealth: Payer: Self-pay | Admitting: *Deleted

## 2016-10-08 ENCOUNTER — Telehealth: Payer: Self-pay | Admitting: Hematology and Oncology

## 2016-10-08 ENCOUNTER — Encounter: Payer: Self-pay | Admitting: Radiation Oncology

## 2016-10-08 ENCOUNTER — Ambulatory Visit
Admission: RE | Admit: 2016-10-08 | Discharge: 2016-10-08 | Disposition: A | Discharge status: Home / Self Care | Payer: Medicare Other | Source: Ambulatory Visit | Attending: Radiation Oncology | Admitting: Radiation Oncology

## 2016-10-08 VITALS — BP 145/60 | HR 57 | Temp 98.2°F | Ht 73.0 in | Wt 229.4 lb

## 2016-10-08 DIAGNOSIS — N433 Hydrocele, unspecified: Secondary | ICD-10-CM | POA: Insufficient documentation

## 2016-10-08 DIAGNOSIS — R918 Other nonspecific abnormal finding of lung field: Secondary | ICD-10-CM

## 2016-10-08 DIAGNOSIS — Z87891 Personal history of nicotine dependence: Secondary | ICD-10-CM | POA: Diagnosis not present

## 2016-10-08 DIAGNOSIS — I350 Nonrheumatic aortic (valve) stenosis: Secondary | ICD-10-CM | POA: Diagnosis not present

## 2016-10-08 DIAGNOSIS — I110 Hypertensive heart disease with heart failure: Secondary | ICD-10-CM | POA: Insufficient documentation

## 2016-10-08 DIAGNOSIS — Z1329 Encounter for screening for other suspected endocrine disorder: Secondary | ICD-10-CM

## 2016-10-08 DIAGNOSIS — Z7901 Long term (current) use of anticoagulants: Secondary | ICD-10-CM | POA: Insufficient documentation

## 2016-10-08 DIAGNOSIS — I517 Cardiomegaly: Secondary | ICD-10-CM | POA: Diagnosis not present

## 2016-10-08 DIAGNOSIS — C321 Malignant neoplasm of supraglottis: Secondary | ICD-10-CM

## 2016-10-08 DIAGNOSIS — F1020 Alcohol dependence, uncomplicated: Secondary | ICD-10-CM

## 2016-10-08 DIAGNOSIS — I700 Atherosclerosis of aorta: Secondary | ICD-10-CM | POA: Diagnosis not present

## 2016-10-08 DIAGNOSIS — I509 Heart failure, unspecified: Secondary | ICD-10-CM | POA: Diagnosis not present

## 2016-10-08 DIAGNOSIS — I5032 Chronic diastolic (congestive) heart failure: Secondary | ICD-10-CM

## 2016-10-08 DIAGNOSIS — Z9889 Other specified postprocedural states: Secondary | ICD-10-CM | POA: Insufficient documentation

## 2016-10-08 DIAGNOSIS — I4891 Unspecified atrial fibrillation: Secondary | ICD-10-CM

## 2016-10-08 DIAGNOSIS — I2510 Atherosclerotic heart disease of native coronary artery without angina pectoris: Secondary | ICD-10-CM | POA: Insufficient documentation

## 2016-10-08 DIAGNOSIS — I5031 Acute diastolic (congestive) heart failure: Secondary | ICD-10-CM

## 2016-10-08 MED ORDER — LARYNGOSCOPY SOLUTION RAD-ONC
15.0000 mL | Freq: Once | TOPICAL | Status: AC
  Administered 2016-10-08: 15 mL via TOPICAL
  Filled 2016-10-08: qty 15

## 2016-10-08 NOTE — Telephone Encounter (Signed)
Appointments scheduled per 2/7 LOS. Patient given AVS report and calendars with future scheduled appointments.

## 2016-10-08 NOTE — Addendum Note (Signed)
Encounter addended by: Ernst Spell, RN on: 10/08/2016 10:41 AM<BR>    Actions taken: Visit diagnoses modified, Order list changed, Diagnosis association updated

## 2016-10-08 NOTE — Patient Instructions (Signed)
Cisplatin injection What is this medicine? CISPLATIN (SIS pla tin) is a chemotherapy drug. It targets fast dividing cells, like cancer cells, and causes these cells to die. This medicine is used to treat many types of cancer like bladder, ovarian, and testicular cancers. This medicine may be used for other purposes; ask your health care provider or pharmacist if you have questions. COMMON BRAND NAME(S): Platinol, Platinol -AQ What should I tell my health care provider before I take this medicine? They need to know if you have any of these conditions: -blood disorders -hearing problems -kidney disease -recent or ongoing radiation therapy -an unusual or allergic reaction to cisplatin, carboplatin, other chemotherapy, other medicines, foods, dyes, or preservatives -pregnant or trying to get pregnant -breast-feeding How should I use this medicine? This drug is given as an infusion into a vein. It is administered in a hospital or clinic by a specially trained health care professional. Talk to your pediatrician regarding the use of this medicine in children. Special care may be needed. Overdosage: If you think you have taken too much of this medicine contact a poison control center or emergency room at once. NOTE: This medicine is only for you. Do not share this medicine with others. What if I miss a dose? It is important not to miss a dose. Call your doctor or health care professional if you are unable to keep an appointment. What may interact with this medicine? -dofetilide -foscarnet -medicines for seizures -medicines to increase blood counts like filgrastim, pegfilgrastim, sargramostim -probenecid -pyridoxine used with altretamine -rituximab -some antibiotics like amikacin, gentamicin, neomycin, polymyxin B, streptomycin, tobramycin -sulfinpyrazone -vaccines -zalcitabine Talk to your doctor or health care professional before taking any of these  medicines: -acetaminophen -aspirin -ibuprofen -ketoprofen -naproxen This list may not describe all possible interactions. Give your health care provider a list of all the medicines, herbs, non-prescription drugs, or dietary supplements you use. Also tell them if you smoke, drink alcohol, or use illegal drugs. Some items may interact with your medicine. What should I watch for while using this medicine? Your condition will be monitored carefully while you are receiving this medicine. You will need important blood work done while you are taking this medicine. This drug may make you feel generally unwell. This is not uncommon, as chemotherapy can affect healthy cells as well as cancer cells. Report any side effects. Continue your course of treatment even though you feel ill unless your doctor tells you to stop. In some cases, you may be given additional medicines to help with side effects. Follow all directions for their use. Call your doctor or health care professional for advice if you get a fever, chills or sore throat, or other symptoms of a cold or flu. Do not treat yourself. This drug decreases your body's ability to fight infections. Try to avoid being around people who are sick. This medicine may increase your risk to bruise or bleed. Call your doctor or health care professional if you notice any unusual bleeding. Be careful brushing and flossing your teeth or using a toothpick because you may get an infection or bleed more easily. If you have any dental work done, tell your dentist you are receiving this medicine. Avoid taking products that contain aspirin, acetaminophen, ibuprofen, naproxen, or ketoprofen unless instructed by your doctor. These medicines may hide a fever. Do not become pregnant while taking this medicine. Women should inform their doctor if they wish to become pregnant or think they might be pregnant. There is a   potential for serious side effects to an unborn child. Talk to  your health care professional or pharmacist for more information. Do not breast-feed an infant while taking this medicine. Drink fluids as directed while you are taking this medicine. This will help protect your kidneys. Call your doctor or health care professional if you get diarrhea. Do not treat yourself. What side effects may I notice from receiving this medicine? Side effects that you should report to your doctor or health care professional as soon as possible: -allergic reactions like skin rash, itching or hives, swelling of the face, lips, or tongue -signs of infection - fever or chills, cough, sore throat, pain or difficulty passing urine -signs of decreased platelets or bleeding - bruising, pinpoint red spots on the skin, black, tarry stools, nosebleeds -signs of decreased red blood cells - unusually weak or tired, fainting spells, lightheadedness -breathing problems -changes in hearing -gout pain -low blood counts - This drug may decrease the number of white blood cells, red blood cells and platelets. You may be at increased risk for infections and bleeding. -nausea and vomiting -pain, swelling, redness or irritation at the injection site -pain, tingling, numbness in the hands or feet -problems with balance, movement -trouble passing urine or change in the amount of urine Side effects that usually do not require medical attention (report to your doctor or health care professional if they continue or are bothersome): -changes in vision -loss of appetite -metallic taste in the mouth or changes in taste This list may not describe all possible side effects. Call your doctor for medical advice about side effects. You may report side effects to FDA at 1-800-FDA-1088. Where should I keep my medicine? This drug is given in a hospital or clinic and will not be stored at home. NOTE: This sheet is a summary. It may not cover all possible information. If you have questions about this medicine,  talk to your doctor, pharmacist, or health care provider.  2017 Elsevier/Gold Standard (2007-11-23 14:40:54)

## 2016-10-08 NOTE — Addendum Note (Signed)
Encounter addended by: Ernst Spell, RN on: 10/08/2016 10:56 AM<BR>    Actions taken: MAR administration accepted

## 2016-10-08 NOTE — Telephone Encounter (Signed)
CALLED PATIENT TO INFORM OF LAB ON 10-14-16 @ 10:30 AM AND HIS CT ON 10-09-16 - ARRIVAL TIME - 2:45 PM @ [facility], NO RESTRICTIONS TO TEST, TEST TO BE @ [facility], SPOKE WITH PATIENT'S SGO - SHARON LINDER AND SHE IS AWARE OF THESE APPTS.

## 2016-10-08 NOTE — Addendum Note (Signed)
Encounter addended by: Ernst Spell, RN on: 10/08/2016 10:39 AM<BR>    Actions taken: Charge Capture section accepted

## 2016-10-09 ENCOUNTER — Telehealth: Payer: Self-pay | Admitting: *Deleted

## 2016-10-09 ENCOUNTER — Ambulatory Visit (HOSPITAL_COMMUNITY)
Admission: RE | Admit: 2016-10-09 | Discharge: 2016-10-09 | Disposition: A | Discharge status: Home / Self Care | Payer: Medicare Other | Source: Ambulatory Visit | Attending: Radiation Oncology | Admitting: Radiation Oncology

## 2016-10-09 DIAGNOSIS — R918 Other nonspecific abnormal finding of lung field: Secondary | ICD-10-CM | POA: Insufficient documentation

## 2016-10-09 DIAGNOSIS — C321 Malignant neoplasm of supraglottis: Secondary | ICD-10-CM | POA: Insufficient documentation

## 2016-10-09 NOTE — Telephone Encounter (Signed)
Oncology Nurse Navigator Documentation  Sent fax to [facility] requesting baseline audiology, asked her to call me when appt scheduled, fax copy of report.  'Successful TX Notice' received.  Gayleen Orem, RN, BSN, [place] [facility] Nurse [place] at [place] [phone]

## 2016-10-09 NOTE — Progress Notes (Signed)
Oncology Nurse Navigator Documentation  Met with Kevin Martin during initial consult with Dr. Isidore Moos.  He was accompanied by his SO, Kevin Martin.  1. Further introduced myself as his Navigator, explained my role as a member of the Care Team.   2. Provided New Patient Information packet, discussed contents:  Contact information for physician(s), myself, other members of the Care Team.  Advance Directive information ([place] blue pamphlet with LCSW contact info).  Provided [facility] AD forms.  They expressed interest in assist from SW to complete, they understand will be arranged.  Fall Prevention Patient Safety Plan  Appointment Guideline  Financial Assistance Information sheet  [place] with highlight of [place] 3. Provided introductory explanation of radiation treatment including SIM planning and purpose of Aquaplast head and shoulder mask, showed them example.   4. Provided and discussed education handout for PEG.  5. We discussed his attendance at next Tuesday's [facility] [place], I provided him an 0800 arrival, explained registration and arrival procedures. 6. Of note:  Edentulous.  Quit smoking 09/01/16.  He voiced understanding of Dr. Pearlie Oyster guidance to taper ETOH use.  I escorted them to lobby to register and join them for appt with Dr. Alvy Bimler.   Gayleen Orem, RN, BSN, [place] [facility] Nurse [place] at [place] [phone]

## 2016-10-09 NOTE — Progress Notes (Signed)
Oncology Nurse Navigator Documentation  Met with Mr. Bayle and his SO during initial consult with Dr. Alvy Bimler.  They had just completed consult with Dr. Isidore Moos. 1. Provided and discussed educational handouts for PAC. Provided information/discussed opportunities for smoking cessation support; SO expressed interest:    QuitSmart class/individual counseling at [facility] information 3.  Of note:  They provide total care for SO's mentally/physically challenged 47 YO brother.   4.  They voiced understanding of Dr. Calton Dach discussion of cisplatin chemotherapy as potential part of treatment plan, that I will coordinate baseline audiology exam. 5.  Assisted with post-consult appt scheduling.    I encouraged them to call with questions/concerns, they verbalized understanding.  Gayleen Orem, RN, BSN, [place] Nurse [place] at [place] [phone]

## 2016-10-10 DIAGNOSIS — R918 Other nonspecific abnormal finding of lung field: Secondary | ICD-10-CM | POA: Insufficient documentation

## 2016-10-10 DIAGNOSIS — F1020 Alcohol dependence, uncomplicated: Secondary | ICD-10-CM | POA: Insufficient documentation

## 2016-10-10 NOTE — Assessment & Plan Note (Signed)
He has multiple lung nodules, concern for metastatic disease. Benign pathology cannot be excluded. He will need repeat imaging study in the near future

## 2016-10-10 NOTE — Assessment & Plan Note (Signed)
The patient has chronic atrial fibrillation, currently on anticoagulation therapy. He will need to come off his anticoagulation therapy

## 2016-10-10 NOTE — Assessment & Plan Note (Signed)
I reviewed the imaging study extensively. I'm concerned about the lung nodules that could represent early metastatic disease. In this situation, especially given his cardiac condition, I would favor definitive radiation therapy only without concurrent chemotherapy.

## 2016-10-10 NOTE — Progress Notes (Signed)
[facility] NOTE  Patient Care Team: Berkley Harvey, NP as PCP - General (Nurse Practitioner)  CHIEF COMPLAINTS/PURPOSE OF CONSULTATION:  Supraglottic cancer  HISTORY OF PRESENTING ILLNESS:  Kevin Martin 66 y.o. male is here because of newly diagnosed supraglottic cancer He is here accompanied by his partner, [name] According to the patient, the first initial presentation was due to sensation of sore throat before Thanksgiving of 2017. He saw his primary care provider who prescribed antibiotics, steroid treatment along with antifungal treatment without feeling better. He was subsequently referred to see ENT who evaluated him. 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 Wall thickness was increased in a pattern of severe LVH. Systolic function was normal. The estimated ejection fraction was in the range of 55% to 60%. - Aortic valve: There was moderate stenosis. There was mild regurgitation. - Left atrium: The atrium was moderately dilated. - Right atrium: The atrium was moderately dilated. - Atrial septum: A patent foramen ovale cannot be excluded. - Pulmonary arteries: PA peak pressure: 44 mm Hg (S). - Impressions: Gradients and degree of AS similar to 2016.      08/28/2016 Initial Diagnosis    He saw ENT for evaluation of throat pain. Office evaluation: possible left base of tongue mass, most likely carcinoma. There is no palpable adenopathy. It does appear to extend into the area epiglottic fold, possibly into the piriform, medial side.  The posterior soft palate, uvula, tongue base and vallecula were visualized and appeared healthy without mucosal masses or lesions, except for a mass arising in the left base of tongue that seems to extend over to the aryepiglottic fold and possibly the medial wall of the puriform although it is difficult to tell for sure.  The endolarynx is not involved. The epiglottis, aryepiglottic folds, hypopharynx, supraglottis, glottis were visualized and appeared healthy without mucosal masses or lesions. Vocal fold mobility was intact and symmetric.  The scope was withdrawn from the nose. He tolerated the procedure well.          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 - SEE COMMENT.      09/19/2016 Surgery    She underwent laryngoscopy: There is an exophytic slightly friable mass involving the left area epiglottic fold, medial wall of the piriform sinus, and with extension to the apex. The lateral wall did not appear to be involved. The tongue base was not involved but there may have been slight involvement of the vallecula inferiorly. It did not cross the midline. The esophageal introitus was clear. The remainder of the larynx and hypopharynx was all without lesions.       10/07/2016 PET scan    Left lateral pharyngeal mass has a maximum standard uptake value of 15.1. There are 2 involved adjacent left IIa lymph nodes. No other hypermetabolic lesions identified. 2. Scattered small pulmonary nodules, below sensitive PET-CT size thresholds. Given the degree of motion artifact on the lung sequence, I would recommend a dedicated chest CT in order to provide a baseline accurate measurement for these nodules so that they can be followed up by size. I am skeptical that these are metastatic but surveillance is likely warranted. 3. Other imaging findings of potential clinical significance: Atherosclerosis including the carotid bulbs, aortic arch, coronary arteries, and aortoiliac tree. Cardiomegaly. Right hydrocele. Asymmetric facet  arthropathy on the right at C2-3 which is somewhat hypermetabolic but ascribed to degenerative or erosive arthropathy.      10/09/2016 Imaging    CT chest showed scattered bilateral pulmonary nodules measuring up to 6 mm. As mentioned on  the PET-CT, these are below the size the cut off for reliable resolution on PET imaging. Metastatic disease is a concern and close follow-up is recommended       he denies any hearing deficit, difficulties with chewing food, changes in the quality of his voice or abnormal weight loss. He complained of mild odynophagia and difficulties swallowing cold stuff He denies hearing problems. He denies peripheral neuropathy The patient had significant cardiac history including congestive heart failure. Last hospitalization was in 2016. Since then, he has aggressive medical management. His last echocardiogram show preserved ejection fraction. Moderate aortic stenosis was noted In terms of risk factors, he has been smoking over 30-pack-year, quit on 09/01/2016. He also drinks vodka drinks for the past 15 years also  MEDICAL HISTORY:  Past Medical History:  Diagnosis Date  . Aortic stenosis    moderate by 12/16/15 echo  . Atrial fibrillation ([place])   . Cardiomegaly   . CHF (congestive heart failure) ([place])   . Hypertension   . Shortness of breath dyspnea     SURGICAL HISTORY: Past Surgical History:  Procedure Laterality Date  . COLONOSCOPY    . DIRECT LARYNGOSCOPY N/A 09/19/2016   Procedure: DIRECT LARYNGOSCOPY with BIOPSY;  Surgeon: Izora Gala, MD;  Location: [facility];  Service: ENT;  Laterality: N/A;  . ESOPHAGOSCOPY N/A 09/19/2016   Procedure: ESOPHAGOSCOPY;  Surgeon: Izora Gala, MD;  Location: [facility];  Service: ENT;  Laterality: N/A;  . FRACTURE SURGERY Right    right arm  . NECK SURGERY      SOCIAL HISTORY: Social History   Social History  . Marital status: Divorced    Spouse name: N/A  . Number of children: 3  . Years of education: N/A   Occupational History  . retired Administrator    Social History Main Topics  . Smoking status: Former Smoker    Years: 10.00    Types: Cigars, Cigarettes    Quit date: 09/01/2016  . Smokeless tobacco: Never Used     Comment: quit cigarette  smoking 10 years ago, smokes 5 cigars a day (swisher sweets)  . Alcohol use 0.0 oz/week     Comment: every day 3-4 mixed drinks  . Drug use: No  . Sexual activity: Not on file   Other Topics Concern  . Not on file   Social History Narrative  . No narrative on file    FAMILY HISTORY: Family History  Problem Relation Age of Onset  . Cancer Mother   . Cancer Father   . Stroke Maternal Grandfather   . Heart attack Neg Hx     ALLERGIES:  has No Known Allergies.  MEDICATIONS:  Current Outpatient Prescriptions  Medication Sig Dispense Refill  . acetaminophen (TYLENOL) 500 MG tablet Take 1,000 mg by mouth every 6 (six) hours as needed for mild pain.    Marland Kitchen amLODipine (NORVASC) 10 MG tablet Take 1 tablet (10 mg total) by mouth daily. 30 tablet 2  . folic acid (FOLVITE) 601 MCG tablet Take 400 mcg by mouth daily.    . furosemide (LASIX) 40 MG tablet Take 1 tablet (40 mg total) by mouth daily. 30 tablet 2  . HYDROcodone-acetaminophen (HYCET) 7.5-325 mg/15 ml solution Take by mouth.    Marland Kitchen  lisinopril (PRINIVIL,ZESTRIL) 40 MG tablet Take 1 tablet (40 mg total) by mouth daily. 30 tablet 2  . Multiple Vitamin (MULTIVITAMIN WITH MINERALS) TABS tablet Take 1 tablet by mouth daily. 50+ for Men    . omega-3 acid ethyl esters (LOVAZA) 1 G capsule Take 3 g by mouth daily.     . pravastatin (PRAVACHOL) 80 MG tablet Take 1 tablet (80 mg total) by mouth daily. (Patient taking differently: Take 80 mg by mouth every evening. ) 30 tablet 2  . rivaroxaban (XARELTO) 20 MG TABS tablet Take 1 tablet (20 mg total) by mouth daily with supper. 30 tablet 3  . thiamine 100 MG tablet Take 1 tablet (100 mg total) by mouth daily. 30 tablet 0   No current facility-administered medications for this visit.     REVIEW OF SYSTEMS:   Constitutional: Denies fevers, chills or abnormal night sweats Eyes: Denies blurriness of vision, double vision or watery eyes Respiratory: Denies cough, dyspnea or wheezes Cardiovascular:  Denies palpitation, chest discomfort or lower extremity swelling Gastrointestinal:  Denies nausea, heartburn or change in bowel habits Skin: Denies abnormal skin rashes Lymphatics: Denies new lymphadenopathy or easy bruising Neurological:Denies numbness, tingling or new weaknesses Behavioral/Psych: Mood is stable, no new changes  All other systems were reviewed with the patient and are negative.  PHYSICAL EXAMINATION: ECOG PERFORMANCE STATUS: 1 - Symptomatic but completely ambulatory  Vitals:   10/08/16 1010  BP: (!) 153/47  Pulse: (!) 58  Resp: 17  Temp: 98 F (36.7 C)   Filed Weights   10/08/16 1010  Weight: 229 lb 9.6 oz (104.1 kg)    GENERAL:alert, no distress and comfortable SKIN: skin color, texture, turgor are normal, no rashes or significant lesions EYES: normal, conjunctiva are pink and non-injected, sclera clear OROPHARYNX:no exudate, no erythema and lips, buccal mucosa, and tongue normal  NECK: supple, thyroid normal size, non-tender, without nodularity LYMPH:  no palpable lymphadenopathy in the cervical, axillary or inguinal LUNGS: clear to auscultation and percussion with normal breathing effort HEART: Irregular rate and rhythm with loud systolic murmur, no peripheral edema ABDOMEN:abdomen soft, non-tender and normal bowel sounds Musculoskeletal:no cyanosis of digits and no clubbing  PSYCH: alert & oriented x 3 with fluent speech NEURO: no focal motor/sensory deficits  LABORATORY DATA:  I have reviewed the data as listed Lab Results  Component Value Date   WBC 9.0 09/15/2016   HGB 14.8 09/15/2016   HCT 43.4 09/15/2016   MCV 89.7 09/15/2016   PLT 183 09/15/2016   Lab Results  Component Value Date   NA 137 09/15/2016   K 4.4 09/15/2016   CL 103 09/15/2016   CO2 27 09/15/2016    RADIOGRAPHIC STUDIES: I have personally reviewed the radiological images as listed and agreed with the findings in the report. Ct Chest Wo Contrast  Result Date:  10/09/2016 CLINICAL DATA:  Lung nodule seen on PET scan.  Head and neck cancer. EXAM: CT CHEST WITHOUT CONTRAST TECHNIQUE: Multidetector CT imaging of the chest was performed following the standard protocol without IV contrast. COMPARISON:  PET scan 10/07/2016 FINDINGS: Cardiovascular: The heart size is normal. No pericardial effusion. Coronary artery calcification is noted. Atherosclerotic calcification is noted in the wall of the thoracic aorta. Mediastinum/Nodes: 11 mm AP window lymph node is upper normal to borderline increased in size but show no hypermetabolism on the recent PET-CT. Other scattered upper normal mediastinal lymph nodes are evident. No evidence for gross hilar lymphadenopathy although assessment is limited by the lack of  intravenous contrast on today's study. The esophagus has normal imaging features. There is no axillary lymphadenopathy. Lungs/Pleura: As seen on recent PET-CT, multiple small bilateral pulmonary nodules are evident. These range in size from 2 up to 6 mm and are distributed in all lobes of both lungs. 6 mm right upper lobe nodule seen image 41 series 5. 8 mm right lower lobe nodule seen image 94. 2 mm left upper lobe nodule visible on image 64. 6 mm left lower lobe nodule identified image 86. No focal airspace consolidation. No pulmonary edema or pleural effusion. Upper Abdomen: Unremarkable. Musculoskeletal: Bone windows reveal no worrisome lytic or sclerotic osseous lesions. IMPRESSION: 1. Scattered bilateral pulmonary nodules measuring up to 6 mm. As mentioned on the PET-CT, these are below the size the cut off for reliable resolution on PET imaging. Metastatic disease is a concern and close follow-up is recommended. Electronically Signed   By: Misty Stanley M.D.   On: 10/09/2016 16:17   Nm Pet Image Initial (pi) Skull Base To Thigh  Result Date: 10/07/2016 CLINICAL DATA:  Initial treatment strategy for laryngeal cancer. EXAM: NUCLEAR MEDICINE PET SKULL BASE TO THIGH  TECHNIQUE: 11.6 mCi F-18 FDG was injected intravenously. Full-ring PET imaging was performed from the skull base to thigh after the radiotracer. CT data was obtained and used for attenuation correction and anatomic localization. FASTING BLOOD GLUCOSE:  Value: 123 mg/dl COMPARISON:  CT scan 09/03/2016 FINDINGS: NECK Left pharyngeal wall mass extending down along the piriform sinus along the left vallecula and effacing the left piriform sinus, maximum SUV 15.1. The there are 2 adjacent level IIa lymph nodes, the more superior measuring 1.1 cm in short axis on image 33/4 with maximum SUV of 9.0, and the more caudad slightly obscured but measuring about 9 mm in short axis on image 37/4 with maximum SUV 8.2. Atherosclerotic calcification of the carotid bulbs. CHEST No hypermetabolic mediastinal or hilar nodes. 0.5 by 0.6 cm right upper lobe nodule image 7/7, no corresponding hypermetabolic activity. Indistinct 7 mm nodule in the right upper lobe on image 14/7 common no corresponding hypermetabolic activity. Multiple additional scattered small nodules are present in both lungs, somewhat indistinct margins, not appreciably hypermetabolic. These are blurred by the significant degree of motion artifact. These are below sensitive PET-CT size thresholds. Coronary, aortic arch, and branch vessel atherosclerotic vascular disease. Mild cardiomegaly. ABDOMEN/PELVIS No abnormal hypermetabolic activity within the liver, pancreas, adrenal glands, or spleen. No hypermetabolic lymph nodes in the abdomen or pelvis. Aortoiliac atherosclerotic vascular disease.  Right hydrocele. SKELETON Note is made of high activity at the right C2- 3 facet joint, maximum SUV 8.1, attributed to degenerative facet arthropathy. IMPRESSION: 1. Left lateral pharyngeal mass has a maximum standard uptake value of 15.1. There are 2 involved adjacent left IIa lymph nodes. No other hypermetabolic lesions identified. 2. Scattered small pulmonary nodules, below  sensitive PET-CT size thresholds. Given the degree of motion artifact on the lung sequence, I would recommend a dedicated chest CT in order to provide a baseline accurate measurement for these nodules so that they can be followed up by size. I am skeptical that these are metastatic but surveillance is likely warranted. 3. Other imaging findings of potential clinical significance: Atherosclerosis including the carotid bulbs, aortic arch, coronary arteries, and aortoiliac tree. Cardiomegaly. Right hydrocele. Asymmetric facet arthropathy on the right at C2-3 which is somewhat hypermetabolic but ascribed to degenerative or erosive arthropathy. Electronically Signed   By: Van Clines M.D.   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 I'm concerned about the lung nodules that could represent early metastatic disease. In this situation, especially given his cardiac condition, I would favor definitive radiation therapy only without concurrent chemotherapy.  Atrial fibrillation, controlled ([place]) The patient has chronic atrial fibrillation, currently on anticoagulation therapy. He will need to come off his anticoagulation therapy  Chronic diastolic CHF (congestive heart failure) ([place]) The patient has history of congestive heart failure. His most recent echocardiogram in 2017 show moderate aortic stenosis with left ventricular hypertrophy. His last admission to the hospital was in 2016. He will continue medical management. I felt that given this history, the risk of chemotherapy outweighs the benefit  Multiple lung nodules on CT He has multiple lung nodules, concern for metastatic disease. Benign pathology cannot be excluded. He will need repeat imaging study in the near future  Alcoholism [facility]) He has chronic alcoholism. The patient is advised to stop  drinking    All questions were answered. The patient knows to call the clinic with any problems, questions or concerns. I spent 60 minutes counseling the patient face to face. The total time spent in the appointment was 80 minutes and more than 50% was on counseling.     Heath Lark, MD 10/10/16 7:54 AM

## 2016-10-10 NOTE — Progress Notes (Signed)
error 

## 2016-10-10 NOTE — Assessment & Plan Note (Signed)
He has chronic alcoholism. The patient is advised to stop drinking

## 2016-10-10 NOTE — Assessment & Plan Note (Addendum)
The patient has history of congestive heart failure. His most recent echocardiogram in 2017 show moderate aortic stenosis with left ventricular hypertrophy. His last admission to the hospital was in 2016. He will continue medical management. I felt that given this history, the risk of chemotherapy outweighs the benefit

## 2016-10-13 ENCOUNTER — Encounter: Payer: Self-pay | Admitting: Radiation Oncology

## 2016-10-13 ENCOUNTER — Ambulatory Visit
Admission: RE | Admit: 2016-10-13 | Discharge: 2016-10-13 | Disposition: A | Discharge status: Home / Self Care | Payer: Medicare Other | Source: Ambulatory Visit | Attending: Radiation Oncology | Admitting: Radiation Oncology

## 2016-10-13 ENCOUNTER — Encounter: Payer: Self-pay | Admitting: *Deleted

## 2016-10-13 VITALS — BP 143/62 | HR 53 | Temp 98.5°F | Resp 12 | Wt 228.2 lb

## 2016-10-13 DIAGNOSIS — C321 Malignant neoplasm of supraglottis: Secondary | ICD-10-CM | POA: Diagnosis not present

## 2016-10-13 MED ORDER — SODIUM CHLORIDE 0.9% FLUSH
10.0000 mL | INTRAVENOUS | Status: DC | PRN
Start: 2016-10-13 — End: 2016-10-14
  Administered 2016-10-13: 10 mL via INTRAVENOUS

## 2016-10-13 NOTE — Progress Notes (Addendum)
IV  Catheter removed right hand, tip intact, placed 2   2x2 guse over site, and taped,secured,patient tolerated well, no c/o .2:47 PM

## 2016-10-13 NOTE — Progress Notes (Signed)
Has armband been applied?  yes  Does patient have an allergy to IV contrast dye?: no   Has patient ever received premedication for IV contrast dye?:  no  Does patient take metformin?: no  If patient does take metformin when was the last dose: N/A  Date of lab work: 09/15/16 BUN: 14 CR: 0.84  IV site: hand right, condition patent and no redness  Has IV site been added to flowsheet?  Yes.    BP (!) 143/62   Pulse (!) 53   Temp 98.5 F (36.9 C) (Oral)   Resp 12   Wt 228 lb 3.2 oz (103.5 kg)   SpO2 98%   BMI 30.11 kg/m

## 2016-10-13 NOTE — Progress Notes (Signed)
Head and Neck Cancer Simulation, IMRT treatment planning note   Outpatient  Diagnosis:    ICD-9-CM ICD-10-CM   1. Cancer of supraglottis (H[facility]) 161.1 C32.1 sodium chloride flush (NS) 0.9 % injection 10 mL   I discussed the results of the patient's Chest CT with him.  He understands he has small lung nodules that per my discussion with radiology may be inflammatory vs cancerous. Plan is to pursue definitive RT to the head and neck with curative intent and re-scan lungs in approx 4 months. He is pleased with this plan.  The patient was taken to the CT simulator and laid in the supine position on the table. An Aquaplast head and shoulder mask was custom fitted to the patient's anatomy. High-resolution CT axial imaging was obtained of the head and neck with contrast. I verified that the quality of the imaging is good for treatment planning. 1 Medically Necessary Treatment Device was fabricated and supervised by me: Aquaplast mask.   Treatment planning note I plan to treat the patient with IMRT. I plan to treat the patient's tumor and bilateral neck nodes. I plan to treat to a total dose of 70 Gray in 35  fractions. Dose calculation was ordered from dosimetry.  IMRT planning Note  IMRT is medically necessary and an important modality to deliver adequate dose to the patient's at risk tissues while sparing the patient's normal structures, including the: esophagus, parotid tissue, mandible, brain stem, spinal cord, oral cavity, brachial plexus.  This justifies the use of IMRT in the patient's treatment.   -----------------------------------  Eppie Gibson, MD

## 2016-10-14 ENCOUNTER — Ambulatory Visit
Admission: RE | Admit: 2016-10-14 | Discharge: 2016-10-14 | Disposition: A | Discharge status: Home / Self Care | Payer: Medicare Other | Source: Ambulatory Visit | Attending: Radiation Oncology | Admitting: Radiation Oncology

## 2016-10-14 ENCOUNTER — Encounter: Payer: Self-pay | Admitting: Radiation Oncology

## 2016-10-14 ENCOUNTER — Encounter: Payer: Self-pay | Admitting: *Deleted

## 2016-10-14 ENCOUNTER — Ambulatory Visit: Payer: Medicare Other

## 2016-10-14 ENCOUNTER — Other Ambulatory Visit: Payer: Medicare Other

## 2016-10-14 ENCOUNTER — Ambulatory Visit: Payer: Medicare Other | Admitting: Nutrition

## 2016-10-14 ENCOUNTER — Ambulatory Visit: Payer: Medicare Other | Attending: Radiation Oncology | Admitting: Physical Therapy

## 2016-10-14 VITALS — BP 146/46 | HR 52 | Temp 97.8°F | Resp 20 | Ht 73.0 in | Wt 230.8 lb

## 2016-10-14 DIAGNOSIS — C321 Malignant neoplasm of supraglottis: Secondary | ICD-10-CM

## 2016-10-14 DIAGNOSIS — I5032 Chronic diastolic (congestive) heart failure: Secondary | ICD-10-CM

## 2016-10-14 DIAGNOSIS — R1310 Dysphagia, unspecified: Secondary | ICD-10-CM | POA: Diagnosis present

## 2016-10-14 DIAGNOSIS — R29898 Other symptoms and signs involving the musculoskeletal system: Secondary | ICD-10-CM | POA: Insufficient documentation

## 2016-10-14 DIAGNOSIS — Z1329 Encounter for screening for other suspected endocrine disorder: Secondary | ICD-10-CM

## 2016-10-14 DIAGNOSIS — R293 Abnormal posture: Secondary | ICD-10-CM | POA: Diagnosis not present

## 2016-10-14 DIAGNOSIS — I5031 Acute diastolic (congestive) heart failure: Secondary | ICD-10-CM

## 2016-10-14 LAB — TSH: TSH: 1.421 m(IU)/L (ref 0.320–4.118)

## 2016-10-14 NOTE — Progress Notes (Signed)
Patient was seen in head and neck clinic. 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  He is a patient of Dr. Alvy Bimler and Dr. Isidore Moos.  Past medical history includes CHF, alcoholism, tobacco, atrial fibrillation, hypertension.  Medications include Folvite, Lasix, Multivitamin, Thiamine.  Labs include glucose 125 on January 15.  Height: 6 feet 1 inch. Weight: 230.8 pounds. Usual body weight: 230 pounds. BMI: 30.45.  Patient reports he tolerates and enjoys most foods.  He denies food allergies. Patient continues to drink alcohol on a daily basis but reports he is trying to decrease this. Patient reports he is not going to get the feeding tube.  Nutrition diagnosis: Food and nutrition related knowledge deficit related to new diagnosis of supraglottic cancer as evidenced by no prior need for nutrition related information.  Intervention: Educated patient on strategies for increasing calories and protein in small frequent meals and snacks. Reviewed high-calorie high-protein fact sheet. Educated patient regarding dry mouth strategies and provided fact sheet. Recommended patient follow a bowel regimen to avoid constipation. Brief education provided on PEG tube feeding and potential need for tube feeding placement. Questions were answered.  Teach back method used.  Monitoring, evaluation, goals: Patient will tolerate adequate calories and protein to minimize loss of lean body mass and promote healing throughout treatment.  Next visit: To be scheduled weekly with treatment.  **Disclaimer: This note was dictated with voice recognition software. Similar sounding words can inadvertently be transcribed and this note may contain transcription errors which may not have been corrected upon publication of note.**

## 2016-10-14 NOTE — Progress Notes (Signed)
Head & Neck Multidisciplinary Clinic Clinical Social Work  Clinical Social Work met with patient/family at head & neck multidisciplinary clinic to offer support and assess for psychosocial needs.  Kevin Martin was accompanied by his spouse.  The patient indicated his only concern at this time is that "the treatment works".  Patient's spouse concern was managing the patient side effects.  CSW acknowledged and validated patient/family concerns.  Kevin Martin shared his brother in law lives with them and has a mental disability.  Patient and spouse provide full time care at home for their brother.  CSW explored positive coping strategies patient utilizes when dealing with difficult situations.  Kevin Martin shared he just "take things as they come".  CSW discussed possible strategies for managing stress through treatment.  Kevin Martin reported he drinks 3 glasses of vodka/seltzer with minimal alcohol intake- he shared the radiation oncologist requested he limit his intake and he has had no difficulty doing so.  CSW discussed common emotional responses to radiation treatment and strongly encouraged patient to follow up with CSW if he begins to feel heightened emotions such as agitation, sadness, anxiety, etc.     Clinical Social Work briefly discussed [place] Work role and [employer] support programs/services.  Clinical Social Work encouraged patient to call with any additional questions or concerns.   Polo Riley, MSW, LCSW, OSW-C Clinical Social Worker [facility] [phone]

## 2016-10-14 NOTE — Progress Notes (Signed)
Oncology Nurse Navigator Documentation  Met with Mr. Brannock during CT SIM.  He was accompanied by his SO. I reviewed check-in/arrival procedure for tomorrow's H&N MDC. They understand I can be contacted with needs/concerns.  Gayleen Orem, RN, BSN, [place] Oncology Nurse [facility] [place] [phone]

## 2016-10-14 NOTE — Patient Instructions (Signed)
SWALLOWING EXERCISES Do these 6 of the 7 days per week until 6 months after your last day of radiation, then 3 times per week afterwards  1. Effortful Swallows - Press your tongue against the roof of your mouth for 3 seconds, then squeeze  the muscles in your neck while you swallow your saliva or a sip of water - Repeat 15-20 times, 2-3 times a day, and use whenever you eat or drink  2. Masako Swallow - swallow with your tongue sticking out - Stick tongue out past your teeth and gently bite tongue with your teeth - Swallow, while holding your tongue with your teeth - Repeat 15-20 times, 2-3 times a day *use a wet spoon if your mouth gets dry*  3. Pitch Raise - Repeat "he", once per second in as high of a pitch as you can - Repeat 20 times, 2-3 times a day  4. Shaker Exercise - head lift - Lie flat on your back in your bed or on a couch without pillows - Raise your head and look at your feet - KEEP YOUR SHOULDERS DOWN - HOLD FOR 45-60 SECONDS, then lower your head back down - Repeat 3 times, 2-3 times a day  5. Mendelsohn Maneuver - "half swallow" exercise - Start to swallow, and keep your Adam's apple up by squeezing hard with the            muscles of the throat - Hold the squeeze for 5-7 seconds and then relax - Repeat 20 times, 2-3 times a day *use a wet spoon if your mouth gets dry*  6. Breath Hold - Say "HUH!" loudly, then hold your breath for 3 seconds at your voice box - Repeat 20 times, 2-3 times a day  7. Chin pushback - Open your mouth  - Place your fist UNDER your chin near your neck, and push back with your fist for 5 seconds - Repeat 10 times, 2-3 times a day
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
## 2016-10-14 NOTE — Progress Notes (Signed)
Oncology Nurse Navigator Documentation  Met with Kevin Martin during [facility]&N [place].  He was accompanied by his SO, [name].  Provided verbal and written overview of [place], the clinicians who will be seeing him, encouraged him to ask questions during his time with them.  He was seen by Nutrition, SLP, PT, SW and [place].    I met with him at clinic end, answered his questions. He understands I can be contacted with needs/concerns.   Gayleen Orem, RN, BSN, [place] [facility]ck Oncology Nurse [place] at [place] [phone]

## 2016-10-14 NOTE — Progress Notes (Signed)
Financial Counselor--Spoke with patient today--addressed financial questions--gave him information regarding our grants--said he would bring in his income verification to see if he qualifies for [employer]

## 2016-10-14 NOTE — Therapy (Signed)
[facility] [address] [place], [place], [address] Phone: [phone]   Fax:  [phone]  Speech Language Pathology Evaluation  Patient Details  Name: Kevin Martin MRN: [id number] Date of Birth: April 04, 1951 Referring Provider: Eppie Gibson, MD  Encounter Date: 10/14/2016      End of Session - 10/14/16 1348    Visit Number 1   Number of Visits 3   Date for SLP Re-Evaluation 12/19/16   SLP Start Time 0830   SLP Stop Time  0915   SLP Time Calculation (min) 45 min   Activity Tolerance Patient tolerated treatment well      Past Medical History:  Diagnosis Date  . Aortic stenosis    moderate by 12/16/15 echo  . Atrial fibrillation ([place])   . Cardiomegaly   . CHF (congestive heart failure) ([place])   . Hypertension   . Shortness of breath dyspnea     Past Surgical History:  Procedure Laterality Date  . COLONOSCOPY    . DIRECT LARYNGOSCOPY N/A 09/19/2016   Procedure: DIRECT LARYNGOSCOPY with BIOPSY;  Surgeon: Izora Gala, MD;  Location: [facility];  Service: ENT;  Laterality: N/A;  . ESOPHAGOSCOPY N/A 09/19/2016   Procedure: ESOPHAGOSCOPY;  Surgeon: Izora Gala, MD;  Location: [place];  Service: ENT;  Laterality: N/A;  . FRACTURE SURGERY Right    right arm  . NECK SURGERY      There were no vitals filed for this visit.      Subjective Assessment - 10/14/16 0838    Subjective Pt complains of odynophagia   Patient is accompained by: --  significant other   Currently in Pain? No/denies            SLP Evaluation [facility] - 10/14/16 8527      SLP Visit Information   SLP Received On 10/14/16   Referring Provider Eppie Gibson, MD   Onset Date November 2017   Medical Diagnosis Supraglottic CA     Prior Functional Status   Cognitive/Linguistic Baseline Within functional limits     Cognition   Overall Cognitive Status Within Functional Limits for tasks assessed     Auditory Comprehension   Overall Auditory  Comprehension Appears within functional limits for tasks assessed     Verbal Expression   Overall Verbal Expression Appears within functional limits for tasks assessed     Oral Motor/Sensory Function   Overall Oral Motor/Sensory Function Appears within functional limits for tasks assessed     Motor Speech   Overall Motor Speech Impaired   Articulation Impaired  due to endentulous   Level of Impairment Phrase   Intelligibility Intelligible      Pt currently tolerates regular diet with thin liquids. He is endentulous.No plan for PEG at this time. POs: Pt ate turnkey sandwich with water without overt s/s aspiration. Thyroid elevation appeared adequate, and swallows appeared timely. Oral residue noted as minimal. Pt's swallow deemed WNL/WFL at this time.   Because data states the risk for dysphagia during and after radiation treatment is high due to undergoing radiation tx, SLP taught pt about the possibility of reduced/limited ability for PO intake during rad tx. SLP encouraged pt to continue swallowing POs as far into rad tx as possible, even ingesting POs and/or completing HEP shortly after administration of pain meds.   SLP educated pt re: changes to swallowing musculature after rad tx, and why adherence to dysphagia HEP provided today and PO consumption was necessary to inhibit muscular disuse atrophy  and to reduce muscle fibrosis following rad tx. Pt demonstrated understanding of these things to SLP.   SLP then developed a HEP for pt and pt was instructed how to perform exercises involving lingual, vocal, and pharyngeal strengthening. SLP performed each exercise and pt return demonstrated each exercise. SLP ensured pt performance was correct prior to moving on to next exercise. Pt was instructed to complete this program x2-3 times a day, 6-7 days/week until 6 months after their last rad tx, then x2-3 a week after that.                    SLP Education - 10/28/16 1347     Education provided Yes   Education Details HEP, late effects head/neck radiation on swallowing ability   Person(s) Educated Patient;Other (comment)  SO   Methods Explanation;Demonstration;Verbal cues;Handout   Comprehension Verbalized understanding;Returned demonstration;Verbal cues required;Need further instruction          SLP Short Term Goals - 10/28/16 1351      SLP SHORT TERM GOAL #1   Title pt will complete HEP with rare min A   Time 1   Period --  visit   Status New     SLP SHORT TERM GOAL #2   Title pt will tell SLP why  he is completing HEP   Time 1   Period --  visits   Status New     SLP SHORT TERM GOAL #3   Title pt will tell SLP foods he could attempt to begin to return to WNL variety of food textures   Time 1   Period --  visits   Status New          SLP Long Term Goals - 28-Oct-2016 1352      SLP LONG TERM GOAL #1   Title pt will complete HEP with modified independnce   Time 2   Period --  visits   Status New     SLP LONG TERM GOAL #2   Title pt will tell SLP 3 overt s/s aspiration PNA with modified independence   Time 2   Period --  visits   Status New     SLP LONG TERM GOAL #3   Title pt will tell SLP how long he must complete HEP >x4/week (6 monhts after last day rad tx)   Time 2   Period --  visits   Status New          Plan - October 28, 2016 1349    Clinical Impression Statement Pt presetns with swallowing essentially WNL except for odynophagia rated 3-4/10 (10=worst pain ever) when swallowing. Pt will require skilled ST to cont to assess safety with POs, and correct completion of HEP.   Speech Therapy Frequency --  once approx every four weeks   Duration --  2 additional visits   Treatment/Interventions Aspiration precaution training;Pharyngeal strengthening exercises;Diet toleration management by SLP;Compensatory techniques;Trials of upgraded texture/liquids;Cueing hierarchy;Patient/family education;Oral motor exercises;SLP  instruction and feedback   Potential to Achieve Goals Good      Patient will benefit from skilled therapeutic intervention in order to improve the following deficits and impairments:   Dysphagia, unspecified type      G-Codes - 10/28/16 1401    Functional Assessment Tool Used noms -7   Functional Limitations Swallowing   Swallow Current Status (Z6109) 0 percent impaired, limited or restricted   Swallow Goal Status (U0454) At least 1 percent but less than 20 percent impaired, limited or  restricted      Problem List Patient Active Problem List   Diagnosis Date Noted  . Multiple lung nodules on CT 10/10/2016  . Alcoholism ([place]) 10/10/2016  . Cancer of supraglottis ([place]) 10/08/2016  . Chronic atrial fibrillation ([place]) 02/14/2015  . Chronic diastolic CHF (congestive heart failure) ([place]) 02/14/2015  . Chronic diastolic heart failure ([place]) 02/14/2015  . Prediabetes 01/23/2015  . Tobacco abuse 01/23/2015  . Acute on chronic congestive heart failure ([place]) 01/15/2015  . Acute congestive heart failure with left ventricular diastolic dysfunction ([place]) 01/14/2015  . Alcohol abuse, daily use 01/14/2015  . Acute respiratory failure ([place]) 01/14/2015  . Acute hyponatremia 01/14/2015  . Acute right heart failure 01/14/2015  . Congestive heart disease ([place])   . Right heart failure 01/03/2015  . Cardiomegaly 01/03/2015  . Atrial fibrillation, controlled ([place]) 01/03/2015  . Moderate aortic stenosis 01/03/2015  . Chronic anticoagulation 01/03/2015  . Hypertension 01/03/2015  . Dyspnea 01/03/2015  . A-fib ([place]) 02/17/2014  . Long term current use of anticoagulant 02/17/2014  . Essential (primary) hypertension 11/01/2013  . Adult BMI 30+ 10/21/2013  . Adiposity 04/22/2013  . H/O cardiovascular disorder 04/22/2013  . Headache, tension-type 02/05/2013    [facility] ,[facility], CCC-SLP  10/14/2016, 2:01 PM  [facility] [address] [place], [place], [address] Phone: [phone]   Fax:  [phone]  Name: Kevin Martin MRN: [id number] Date of Birth: [birth year]

## 2016-10-16 ENCOUNTER — Other Ambulatory Visit: Payer: Self-pay | Admitting: Radiation Oncology

## 2016-10-16 ENCOUNTER — Telehealth: Payer: Self-pay

## 2016-10-16 DIAGNOSIS — C321 Malignant neoplasm of supraglottis: Secondary | ICD-10-CM

## 2016-10-16 MED ORDER — HYDROCODONE-ACETAMINOPHEN 7.5-325 MG/15ML PO SOLN: 10.0000 mL | Freq: Four times a day (QID) | ORAL | 0 refills | Status: DC | PRN

## 2016-10-22 DIAGNOSIS — I4891 Unspecified atrial fibrillation: Secondary | ICD-10-CM | POA: Diagnosis not present

## 2016-10-22 DIAGNOSIS — I509 Heart failure, unspecified: Secondary | ICD-10-CM | POA: Diagnosis not present

## 2016-10-22 DIAGNOSIS — I350 Nonrheumatic aortic (valve) stenosis: Secondary | ICD-10-CM | POA: Diagnosis not present

## 2016-10-22 DIAGNOSIS — Z7901 Long term (current) use of anticoagulants: Secondary | ICD-10-CM | POA: Diagnosis not present

## 2016-10-22 DIAGNOSIS — Z9889 Other specified postprocedural states: Secondary | ICD-10-CM | POA: Diagnosis not present

## 2016-10-22 DIAGNOSIS — I110 Hypertensive heart disease with heart failure: Secondary | ICD-10-CM | POA: Diagnosis not present

## 2016-10-22 DIAGNOSIS — N433 Hydrocele, unspecified: Secondary | ICD-10-CM | POA: Diagnosis not present

## 2016-10-22 DIAGNOSIS — C321 Malignant neoplasm of supraglottis: Secondary | ICD-10-CM | POA: Diagnosis not present

## 2016-10-22 DIAGNOSIS — I2510 Atherosclerotic heart disease of native coronary artery without angina pectoris: Secondary | ICD-10-CM | POA: Diagnosis not present

## 2016-10-22 DIAGNOSIS — I700 Atherosclerosis of aorta: Secondary | ICD-10-CM | POA: Diagnosis not present

## 2016-10-22 DIAGNOSIS — Z87891 Personal history of nicotine dependence: Secondary | ICD-10-CM | POA: Diagnosis not present

## 2016-10-22 DIAGNOSIS — I517 Cardiomegaly: Secondary | ICD-10-CM | POA: Diagnosis not present

## 2016-10-23 ENCOUNTER — Ambulatory Visit
Admission: RE | Admit: 2016-10-23 | Discharge: 2016-10-23 | Disposition: A | Discharge status: Home / Self Care | Payer: Medicare Other | Source: Ambulatory Visit | Attending: Radiation Oncology | Admitting: Radiation Oncology

## 2016-10-23 ENCOUNTER — Encounter: Payer: Self-pay | Admitting: *Deleted

## 2016-10-23 DIAGNOSIS — C321 Malignant neoplasm of supraglottis: Secondary | ICD-10-CM | POA: Diagnosis not present

## 2016-10-23 MED ORDER — BIAFINE EX EMUL
Freq: Two times a day (BID) | CUTANEOUS | Status: DC
  Administered 2016-10-23: 10:00:00 via TOPICAL

## 2016-10-23 NOTE — Progress Notes (Signed)
SKIN CARE DURING RADIATION TREATMENT-HEAD AND NECK  RECOMMENDATIONS: ? Use unscented soap (Dove) ? When showering it is fine for water to touch the area, but please avoid direct spray on the treatment field.  Also, wash inside and around the marked area ? When drying gently blot the area ? Avoid using lotions, oils or powders as well as products with alcohol ? If you shave, use an electric razor and DO NOT use pre or after shave lotion  ? Moisturizer o You may be given Radiaplex Gel or Biafine (provided by nursing) to use. Apply twice daily, once after treatment and then again prior to bedtime o Your Radiation Oncologist may suggest other skin care products ? PLEASE DO NOT APPLY ANYTHING TO THE TREATMENT AREA SKIN WITH 4 HOURS  PRIOR TO RADIATION  ? Mouth care o Soothing relief: rinse your mouth every 1-2 hours with a solution of  teaspoon baking soda and 1/8 teaspoon salt mixed in 1 cup of warm water o DO NOT use mouthwashes that contain alcohol, try using BIOTENE instead   Radiation video link to watch at home:  http://rtanswers.org/treatmentinformation/whattoexpect/index

## 2016-10-23 NOTE — Progress Notes (Signed)

## 2016-10-24 ENCOUNTER — Ambulatory Visit
Admission: RE | Admit: 2016-10-24 | Discharge: 2016-10-24 | Disposition: A | Discharge status: Home / Self Care | Payer: Medicare Other | Source: Ambulatory Visit | Attending: Radiation Oncology | Admitting: Radiation Oncology

## 2016-10-24 DIAGNOSIS — C321 Malignant neoplasm of supraglottis: Secondary | ICD-10-CM | POA: Diagnosis not present

## 2016-10-25 NOTE — Progress Notes (Signed)
Oncology Nurse Navigator Documentation  To provide support, encouragement and care continuity, met with Mr. Liller for his initial Tom[place] RT.  He was accompanied by his wife.  I reviewed the 2-step treatment process, reminded him of the registration/arrival procedure for subsequent treatments.  His wife observed the treatment, RTTCandace explained procedure.  Wife expressed appreciation for the learning opportunity.  Mr. Isabell completed treatment without difficulty, denied questions/concerns.  I escorted him to Nursing for Post-Sim Ed. He understands to call me with needs/concerns.  Gayleen Orem, RN, BSN, [place] [facility]ck Oncology Nurse [place] at [place] [phone]

## 2016-10-27 ENCOUNTER — Ambulatory Visit
Admission: RE | Admit: 2016-10-27 | Discharge: 2016-10-27 | Disposition: A | Discharge status: Home / Self Care | Payer: Medicare Other | Source: Ambulatory Visit | Attending: Radiation Oncology | Admitting: Radiation Oncology

## 2016-10-27 ENCOUNTER — Encounter: Payer: Self-pay | Admitting: Radiation Oncology

## 2016-10-27 VITALS — BP 123/55 | HR 56 | Temp 98.1°F | Ht 73.0 in | Wt 224.2 lb

## 2016-10-27 DIAGNOSIS — C321 Malignant neoplasm of supraglottis: Secondary | ICD-10-CM | POA: Diagnosis not present

## 2016-10-27 MED ORDER — HYDROCODONE-ACETAMINOPHEN 5-325 MG PO TABS: 1.0000 | ORAL_TABLET | Freq: Four times a day (QID) | ORAL | 0 refills | Status: DC | PRN

## 2016-10-27 MED ORDER — BIAFINE EX EMUL
CUTANEOUS | Status: DC | PRN
Start: 2016-10-27 — End: 2016-11-03

## 2016-10-27 NOTE — Progress Notes (Addendum)
   Weekly Management Note:  Outpatient    ICD-9-CM ICD-10-CM   1. 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  CBCT/MVCT images/Port film x-rays were reviewed.  The chart was checked. Sore throat. Dry mouth.  Lidocaine Rxd by PCP. Hycet is expensive.  Physical Findings:  Wt Readings from Last 3 Encounters:  10/27/16 224 lb 3.2 oz (101.7 kg)  10/14/16 230 lb 12.8 oz (104.7 kg)  10/13/16 228 lb 3.2 oz (103.5 kg)    height is '6\' 1"'$  (1.854 m) and weight is 224 lb 3.2 oz (101.7 kg). His temperature is 98.1 F (36.7 C). His blood pressure is 123/55 (abnormal) and his pulse is 56 (abnormal). His oxygen saturation is 98%.  NAD, throat without mucositis or thrush  CBC    Component Value Date/Time   WBC 9.0 09/15/2016 0819   RBC 4.84 09/15/2016 0819   HGB 14.8 09/15/2016 0819   HCT 43.4 09/15/2016 0819   PLT 183 09/15/2016 0819   MCV 89.7 09/15/2016 0819   MCH 30.6 09/15/2016 0819   MCHC 34.1 09/15/2016 0819   RDW 13.4 09/15/2016 0819   LYMPHSABS 1.1 01/14/2015 1239   MONOABS 0.7 01/14/2015 1239   EOSABS 0.0 01/14/2015 1239   BASOSABS 0.0 01/14/2015 1239     CMP     Component Value Date/Time   NA 137 09/15/2016 0819   K 4.4 09/15/2016 0819   CL 103 09/15/2016 0819   CO2 27 09/15/2016 0819   GLUCOSE 125 (H) 09/15/2016 0819   BUN 14 09/15/2016 0819   CREATININE 0.84 09/15/2016 0819   CALCIUM 9.4 09/15/2016 0819   PROT 6.6 09/15/2016 0819   ALBUMIN 3.6 09/15/2016 0819   AST 21 09/15/2016 0819   ALT 19 09/15/2016 0819   ALKPHOS 47 09/15/2016 0819   BILITOT 1.1 09/15/2016 0819   GFRNONAA >60 09/15/2016 0819   GFRAA >60 09/15/2016 0819     Impression:  The patient is tolerating radiotherapy.   Plan:  Continue radiotherapy as planned. Rx Vicodin for pain. Instructed him to take lidocaine as follows: Mix 1part 2% viscous lidocaine, 1part H20. Swish and  swallow 31m of this mixture, 326m before meals and at bedtime, up to QID. Biotene for dry mouth. -----------------------------------  SaEppie GibsonMD

## 2016-10-27 NOTE — Progress Notes (Signed)
Pt here for patient teaching.  Pt given Radiation and You booklet, Managing Acute Radiation Side Effects for Head and Neck Cancer handout, skin care instructions and Sonafine.  Reviewed areas of pertinence such as fatigue, hair loss, mouth changes, skin changes, throat changes and taste changes . Pt able to give teach back of to pat skin, use unscented/gentle soap and drink plenty of water,apply Sonafine bid, avoid applying anything to skin within 4 hours of treatment and to use an electric razor if they must shave. Pt verbalizes understanding of information given and will contact nursing with any questions or concerns.     Http://rtanswers.org/treatmentinformation/whattoexpect/index  Managing Acute Radiation Side Effects for Head and Neck Cancer  Skin irritation:  . Sonafine  Topical Emulsion: First-line topical cream to help soothe skin irritation.  Apply to skin in radiation fields at least 4 hours before radiotherapy, or any time after treatments during the rest of the day.  . Triple Antibiotic Ointment (Neosporin): Apply to areas of skin with moist breakdown to prevent infection.  . 1% hydrocortisone cream: Apply to areas of skin that are itching, up to three times a day.  Arnetha Massy (Silver Sulfadiazine): Used in select cases if large patches of skin develop moist breakdown (let physician or nurse know if you have a "sulfa" drug allergy)  Soreness in mouth or throat: . Baking Soda Rinse: a home remedy to soothe/cleanse mouth and loosen thick saliva.  Mix 1/2 teaspoon salt, 1/2 teaspoon baking soda, 1 pint water (16 oz or two cups).  Swish, gargle and spit as needed to soothe/cleanse mouth. Use as often as you want.  . Sucralfate (Carafate): coats throat to soothe it before meals or any time of day. Crush 1 tablet in 10 mL H20 and swallow up to four times a day.  . 2% viscous Lidocaine (Magic Mouth Wash): Soothes mouth and/or throat by numbing your mucous membranes. Mix 1 part 2% viscous  lidocaine (Magic Mouth Wash), 1 part H20. Swish and/or swallow 29mL of this mixture, 36min before meals and at bedtime, up to four times a day. Alternate with Sucralfate (Carafate).  . Narcotics: Various short acting and long acting narcotics can be prescribed.  Often, medical oncology will prescribe these if you are receiving chemotherapy concurrently. Narcotics may cause constipation. It may be helpful to take a stool softener (Docusate Sodium) or gentle laxative (ie Senna or Polyethylene Glycol) to prevent constipation.  Having food in your stomach before ingesting a narcotic may reduce risk of stomach upset.  Thick Saliva: . Baking Soda Rinse: a home remedy to soothe/cleanse mouth and loosen thick saliva.  Mix 1/2 teaspoon salt, 1/2 teaspoon baking soda, 1 pint water (16 oz or two cups).  Swish, gargle, and spit as needed to soothe/cleanse mouth. Use as often as you want.  . Some patients find Diet Ginger Ale or Papaya Juice to be helpful.  . In extreme cases, your physician may consider prescribing a Scopolamine transdermal patch which dries up your saliva.     Poor taste, or lack of taste:   . There are no well-established medications to combat taste bud changes from radiotherapy.  It often takes weeks to months to regain taste function.  Eating bland foods and drinking nutritional shakes  may help you maintain your weight when food is not enjoyable.  Some patients supplement their oral intake with a feeding tube.  Fatigue and weakness: . There is not a well-established safe and effective medication to combat radiation-induced fatigue.  However,  if you are able to perform light exercise (such as a daily walk, yoga, recumbent stationary bicycling), this may combat fatigue and help you maintain muscle mass during treatment.  . Maintaining hydration and nutrition are also important.  If you have not been referred to a nutritionist and would like a referral, please let your nurse or physician  know.  . Try to get at least 8 hours of sleep each night. You may need a daily nap, but try not to nap so late that it interferes with your nightly sleep schedule. SKIN CARE DURING RADIATION TREATMENT-HEAD AND NECK  RECOMMENDATIONS: ? Use unscented soap (Dove) ? When showering it is fine for water to touch the area, but please avoid direct spray on the treatment field.  Also, wash inside and around the marked area ? When drying gently blot the area ? Avoid using lotions, oils or powders as well as products with alcohol ? If you shave, use an electric razor and DO NOT use pre or after shave lotion  ? Moisturizer o You may be given Radiaplex Gel or Biafine (provided by nursing) to use. Apply twice daily, once after treatment and then again prior to bedtime o Your Radiation Oncologist may suggest other skin care products ? PLEASE DO NOT APPLY ANYTHING TO THE TREATMENT AREA SKIN WITH 4 HOURS  PRIOR TO RADIATION  ? Mouth care o Soothing relief: rinse your mouth every 1-2 hours with a solution of  teaspoon baking soda and 1/8 teaspoon salt mixed in 1 cup of warm water o DO NOT use mouthwashes that contain alcohol, try using BIOTENE instead     Radiation video link to watch at home:  http://rtanswers.org/treatmentinformation/whattoexpect/index

## 2016-10-27 NOTE — Progress Notes (Signed)
Mr. Debord presents for his 3rd fraction of radiation to his Head and Neck. He reports throat pain when swallowing. It is worse in the morning and then improves. It does get progressively worse throughout the day. He is taking Hycet at bedtime. He was prescribed lidocaine by his NP on Friday, which he will pick up today. He is using biafine twice daily except this weekend. He will start using it daily. He tells me he is eating and drinking well.   BP (!) 123/55   Pulse (!) 56   Temp 98.1 F (36.7 C)   Ht '6\' 1"'$  (1.854 m)   Wt 224 lb 3.2 oz (101.7 kg)   SpO2 98% Comment: room air  BMI 29.58 kg/m    Wt Readings from Last 3 Encounters:  10/27/16 224 lb 3.2 oz (101.7 kg)  10/14/16 230 lb 12.8 oz (104.7 kg)  10/13/16 228 lb 3.2 oz (103.5 kg)

## 2016-10-28 ENCOUNTER — Ambulatory Visit
Admission: RE | Admit: 2016-10-28 | Discharge: 2016-10-28 | Disposition: A | Discharge status: Home / Self Care | Payer: Medicare Other | Source: Ambulatory Visit | Attending: Radiation Oncology | Admitting: Radiation Oncology

## 2016-10-28 DIAGNOSIS — C321 Malignant neoplasm of supraglottis: Secondary | ICD-10-CM | POA: Diagnosis not present

## 2016-10-29 ENCOUNTER — Ambulatory Visit: Payer: Medicare Other | Admitting: Nutrition

## 2016-10-29 ENCOUNTER — Ambulatory Visit
Admission: RE | Admit: 2016-10-29 | Discharge: 2016-10-29 | Disposition: A | Discharge status: Home / Self Care | Payer: Medicare Other | Source: Ambulatory Visit | Attending: Radiation Oncology | Admitting: Radiation Oncology

## 2016-10-29 DIAGNOSIS — C321 Malignant neoplasm of supraglottis: Secondary | ICD-10-CM | POA: Diagnosis not present

## 2016-10-29 NOTE — Progress Notes (Signed)
Nutrition follow up completed with patient after radiation therapy for supraglottic cancer. Weight decreased and documented as 224.2 pounds down from 230.8 pounds 2 weeks ago. Patient reports he has decreased alcohol intake by about half. Denies trouble chewing but has some pain with swallowing. Lidocaine helps. Patient drinking Ensure and CIB.  Nutrition diagnosis: Food and Nutrition related knowledge deficit improved.  Intervention: Stressed importance of maintaining lean body mass. Recommended patient consume one oral nutrition supplement daily. Will monitor weight and increase oral nutrition supplements as needed. Questions answered and teach back method used.  Monitoring, Evaluation, Goals: Increase oral intake to maintain lean body mass.  Next Visit: Wednesday, March 7 after radiation therapy.  **Disclaimer: This note was dictated with voice recognition software. Similar sounding words can inadvertently be transcribed and this note may contain transcription errors which may not have been corrected upon publication of note.**

## 2016-10-30 ENCOUNTER — Ambulatory Visit
Admission: RE | Admit: 2016-10-30 | Discharge: 2016-10-30 | Disposition: A | Discharge status: Home / Self Care | Payer: Medicare Other | Source: Ambulatory Visit | Attending: Radiation Oncology | Admitting: Radiation Oncology

## 2016-10-30 DIAGNOSIS — N433 Hydrocele, unspecified: Secondary | ICD-10-CM | POA: Diagnosis not present

## 2016-10-30 DIAGNOSIS — Z9889 Other specified postprocedural states: Secondary | ICD-10-CM | POA: Diagnosis not present

## 2016-10-30 DIAGNOSIS — C321 Malignant neoplasm of supraglottis: Secondary | ICD-10-CM | POA: Diagnosis not present

## 2016-10-30 DIAGNOSIS — I517 Cardiomegaly: Secondary | ICD-10-CM | POA: Diagnosis not present

## 2016-10-30 DIAGNOSIS — I700 Atherosclerosis of aorta: Secondary | ICD-10-CM | POA: Diagnosis not present

## 2016-10-30 DIAGNOSIS — I350 Nonrheumatic aortic (valve) stenosis: Secondary | ICD-10-CM | POA: Diagnosis not present

## 2016-10-30 DIAGNOSIS — I509 Heart failure, unspecified: Secondary | ICD-10-CM | POA: Diagnosis not present

## 2016-10-30 DIAGNOSIS — Z7901 Long term (current) use of anticoagulants: Secondary | ICD-10-CM | POA: Diagnosis not present

## 2016-10-30 DIAGNOSIS — Z87891 Personal history of nicotine dependence: Secondary | ICD-10-CM | POA: Diagnosis not present

## 2016-10-30 DIAGNOSIS — I4891 Unspecified atrial fibrillation: Secondary | ICD-10-CM | POA: Diagnosis not present

## 2016-10-30 DIAGNOSIS — I2510 Atherosclerotic heart disease of native coronary artery without angina pectoris: Secondary | ICD-10-CM | POA: Diagnosis not present

## 2016-10-30 DIAGNOSIS — I110 Hypertensive heart disease with heart failure: Secondary | ICD-10-CM | POA: Diagnosis not present

## 2016-10-31 ENCOUNTER — Ambulatory Visit
Admission: RE | Admit: 2016-10-31 | Discharge: 2016-10-31 | Disposition: A | Discharge status: Home / Self Care | Payer: Medicare Other | Source: Ambulatory Visit | Attending: Radiation Oncology | Admitting: Radiation Oncology

## 2016-10-31 DIAGNOSIS — C321 Malignant neoplasm of supraglottis: Secondary | ICD-10-CM | POA: Diagnosis not present

## 2016-11-03 ENCOUNTER — Ambulatory Visit
Admission: RE | Admit: 2016-11-03 | Discharge: 2016-11-03 | Disposition: A | Discharge status: Home / Self Care | Payer: Medicare Other | Source: Ambulatory Visit | Attending: Radiation Oncology | Admitting: Radiation Oncology

## 2016-11-03 ENCOUNTER — Encounter: Payer: Self-pay | Admitting: Radiation Oncology

## 2016-11-03 VITALS — BP 131/80 | HR 60 | Temp 97.8°F | Resp 18 | Wt 226.6 lb

## 2016-11-03 DIAGNOSIS — C321 Malignant neoplasm of supraglottis: Secondary | ICD-10-CM

## 2016-11-03 MED ORDER — HYDROCODONE-ACETAMINOPHEN 5-325 MG PO TABS: 1.0000 | ORAL_TABLET | ORAL | 0 refills | Status: DC | PRN

## 2016-11-03 NOTE — Progress Notes (Signed)
Kevin Martin arrived for 8th fraction to head and neck.  Patient complains of pain 4 out of 10 when swallowing.  Takes 1-2 Norco daily and always at night.  Also takes Lidocaine rinses before eating.  Complains of fatigue and rests often.  Skin appears intact without any redness.  Using Biafine twice daily.  Reports he is eating his normal diet without modifications.  He is supplementing Premier shake that contains 30G of protein starting today.  Educated patient to watch food intake and supplement additional if his diet decreases.  BP 131/80 (BP Location: Right Arm, Patient Position: Sitting)   Pulse 60   Temp 97.8 F (36.6 C) (Oral)   Resp 18   Wt 226 lb 9.6 oz (102.8 kg)   SpO2 99%   BMI 29.90 kg/m    Wt Readings from Last 3 Encounters:  11/03/16 226 lb 9.6 oz (102.8 kg)  10/27/16 224 lb 3.2 oz (101.7 kg)  10/14/16 230 lb 12.8 oz (104.7 kg)

## 2016-11-03 NOTE — Progress Notes (Signed)
   Weekly Management Note:  Outpatient    ICD-9-CM ICD-10-CM   1. 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  CBCT/MVCT images/Port film x-rays were reviewed.  The chart was checked. Doing relatively well, gaining weight.  Physical Findings:  Wt Readings from Last 3 Encounters:  11/03/16 226 lb 9.6 oz (102.8 kg)  10/27/16 224 lb 3.2 oz (101.7 kg)  10/14/16 230 lb 12.8 oz (104.7 kg)    weight is 226 lb 9.6 oz (102.8 kg). His oral temperature is 97.8 F (36.6 C). His blood pressure is 131/80 and his pulse is 60. His respiration is 18 and oxygen saturation is 99%.  NAD, throat without mucositis or thrush  CBC    Component Value Date/Time   WBC 9.0 09/15/2016 0819   RBC 4.84 09/15/2016 0819   HGB 14.8 09/15/2016 0819   HCT 43.4 09/15/2016 0819   PLT 183 09/15/2016 0819   MCV 89.7 09/15/2016 0819   MCH 30.6 09/15/2016 0819   MCHC 34.1 09/15/2016 0819   RDW 13.4 09/15/2016 0819   LYMPHSABS 1.1 01/14/2015 1239   MONOABS 0.7 01/14/2015 1239   EOSABS 0.0 01/14/2015 1239   BASOSABS 0.0 01/14/2015 1239     CMP     Component Value Date/Time   NA 137 09/15/2016 0819   K 4.4 09/15/2016 0819   CL 103 09/15/2016 0819   CO2 27 09/15/2016 0819   GLUCOSE 125 (H) 09/15/2016 0819   BUN 14 09/15/2016 0819   CREATININE 0.84 09/15/2016 0819   CALCIUM 9.4 09/15/2016 0819   PROT 6.6 09/15/2016 0819   ALBUMIN 3.6 09/15/2016 0819   AST 21 09/15/2016 0819   ALT 19 09/15/2016 0819   ALKPHOS 47 09/15/2016 0819   BILITOT 1.1 09/15/2016 0819   GFRNONAA >60 09/15/2016 0819   GFRAA >60 09/15/2016 0819     Impression:  The patient is tolerating radiotherapy.   Plan:  Continue radiotherapy as planned. Gave him another Rx Vicodin for pain. Instructed him to continue take lidocaine as follows: Mix 1part 2% viscous  lidocaine, 1part H20. Swish and swallow 72m of this mixture, 379m before meals and at bedtime, up to QID. Biotene PRN for dry mouth. -----------------------------------  SaEppie GibsonMD

## 2016-11-04 ENCOUNTER — Ambulatory Visit
Admission: RE | Admit: 2016-11-04 | Discharge: 2016-11-04 | Disposition: A | Discharge status: Home / Self Care | Payer: Medicare Other | Source: Ambulatory Visit | Attending: Radiation Oncology | Admitting: Radiation Oncology

## 2016-11-04 DIAGNOSIS — C321 Malignant neoplasm of supraglottis: Secondary | ICD-10-CM | POA: Diagnosis not present

## 2016-11-05 ENCOUNTER — Ambulatory Visit: Payer: Medicare Other | Admitting: Nutrition

## 2016-11-05 ENCOUNTER — Ambulatory Visit
Admission: RE | Admit: 2016-11-05 | Discharge: 2016-11-05 | Disposition: A | Discharge status: Home / Self Care | Payer: Medicare Other | Source: Ambulatory Visit | Attending: Radiation Oncology | Admitting: Radiation Oncology

## 2016-11-05 DIAGNOSIS — C321 Malignant neoplasm of supraglottis: Secondary | ICD-10-CM | POA: Diagnosis not present

## 2016-11-05 NOTE — Progress Notes (Signed)
Nutrition Follow-up:  Nutrition follow-up completed with patient after radiation therapy for supraglottic cancer. Weight has increased to 226 lb 9.6 oz on March 5th from 224 pounds on 2/26.   Patient reports pain with swallowing but not any worse than in the past.  Patient reports that he is using lidocaine about 30 minutes before he eats and it helps with swallowing.   Reports that he is using baking soda and salt rinse swishing it in mouth and spitting it out. Reports regular bowel movement.  Reports that he is drinking a premier protein shake 1 time per day.  Wife also has purchased carnation instant breakfast.  Reports that he is eating regular consistency foods.  Typically has cereal or eggs and bacon for breakfast, lunch maybe leftovers from night before or sandwich. For dinner wife prepares usually meat and vegetables (last night had grilled chicken salad).     Medications: reviewed  Labs: reviewed    NUTRITION DIAGNOSIS: Food and Nutrition related knowledge deficit improved.   MALNUTRITION DIAGNOSIS: none at this time   INTERVENTION:   Encouraged patient to continue eating well balanced diet focusing on good sources of protein. Discussed soft moist proteins to easy with swallowing.   Discussed calorie and protein level of different oral nutrition supplements and how that relates to weight loss or gain.  Encouraged continuing with current supplement at this time but if weight and/or intake drops would recommend consuming higher calorie supplement (350 calories or more to maintain/prevent weight loss).  Patient and wife verbalized understanding.    MONITORING, EVALUATION, GOAL: Increase oral intake to maintain lean body mass.   NEXT VISIT: March 14 after radiation  Rim Thatch B. Zenia Resides, [place], [place] Registered Dietitian [phone] (pager)

## 2016-11-06 ENCOUNTER — Ambulatory Visit
Admission: RE | Admit: 2016-11-06 | Discharge: 2016-11-06 | Disposition: A | Discharge status: Home / Self Care | Payer: Medicare Other | Source: Ambulatory Visit | Attending: Radiation Oncology | Admitting: Radiation Oncology

## 2016-11-06 DIAGNOSIS — C321 Malignant neoplasm of supraglottis: Secondary | ICD-10-CM | POA: Diagnosis not present

## 2016-11-07 ENCOUNTER — Ambulatory Visit: Payer: Medicare Other

## 2016-11-10 ENCOUNTER — Ambulatory Visit
Admission: RE | Admit: 2016-11-10 | Discharge: 2016-11-10 | Disposition: A | Discharge status: Home / Self Care | Payer: Medicare Other | Source: Ambulatory Visit | Attending: Radiation Oncology | Admitting: Radiation Oncology

## 2016-11-10 ENCOUNTER — Encounter: Payer: Self-pay | Admitting: Radiation Oncology

## 2016-11-10 VITALS — BP 139/58 | HR 50 | Temp 98.2°F | Ht 73.0 in | Wt 229.4 lb

## 2016-11-10 DIAGNOSIS — C321 Malignant neoplasm of supraglottis: Secondary | ICD-10-CM

## 2016-11-10 MED ORDER — LIDOCAINE VISCOUS 2 % MT SOLN: OROMUCOSAL | 5 refills | Status: AC

## 2016-11-10 MED ORDER — BIAFINE EX EMUL
CUTANEOUS | Status: DC | PRN
  Administered 2016-11-10: 09:00:00 via TOPICAL

## 2016-11-10 NOTE — Progress Notes (Signed)
Kevin Martin presents for his 12th fraction of radiation to his Head and neck. He denies pain at this time. He does report using hydrocodone about twice daily for throat pain. He is not sleeping well and waking up every couple of hours. He is sleeping during the day at times. He tells me he is eating well. He is drinking about 3 sixteen ounces of water daily. His neck is slightly red. He is using biafine twice daily and was given a second tube today. He reports having hoarseness to his voice this weekend, but it has improved today.   BP (!) 139/58   Pulse (!) 50   Temp 98.2 F (36.8 C)   Ht '6\' 1"'$  (1.854 m)   Wt 229 lb 6.4 oz (104.1 kg)   SpO2 100% Comment: room air  BMI 30.27 kg/m    Wt Readings from Last 3 Encounters:  11/10/16 229 lb 6.4 oz (104.1 kg)  11/03/16 226 lb 9.6 oz (102.8 kg)  10/27/16 224 lb 3.2 oz (101.7 kg)

## 2016-11-10 NOTE — Progress Notes (Signed)
   Weekly Management Note:  Outpatient    ICD-9-CM ICD-10-CM   1. 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  CBCT/MVCT images/Port film x-rays were reviewed.  The chart was checked. Doing relatively well, gaining weight. Pain well controlled.   Physical Findings:  Wt Readings from Last 3 Encounters:  11/10/16 229 lb 6.4 oz (104.1 kg)  11/03/16 226 lb 9.6 oz (102.8 kg)  10/27/16 224 lb 3.2 oz (101.7 kg)    height is '6\' 1"'$  (1.854 m) and weight is 229 lb 6.4 oz (104.1 kg). His temperature is 98.2 F (36.8 C). His blood pressure is 139/58 (abnormal) and his pulse is 50 (abnormal). His oxygen saturation is 100%.  NAD, uvula with patchy mucositis    CBC    Component Value Date/Time   WBC 9.0 09/15/2016 0819   RBC 4.84 09/15/2016 0819   HGB 14.8 09/15/2016 0819   HCT 43.4 09/15/2016 0819   PLT 183 09/15/2016 0819   MCV 89.7 09/15/2016 0819   MCH 30.6 09/15/2016 0819   MCHC 34.1 09/15/2016 0819   RDW 13.4 09/15/2016 0819   LYMPHSABS 1.1 01/14/2015 1239   MONOABS 0.7 01/14/2015 1239   EOSABS 0.0 01/14/2015 1239   BASOSABS 0.0 01/14/2015 1239     CMP     Component Value Date/Time   NA 137 09/15/2016 0819   K 4.4 09/15/2016 0819   CL 103 09/15/2016 0819   CO2 27 09/15/2016 0819   GLUCOSE 125 (H) 09/15/2016 0819   BUN 14 09/15/2016 0819   CREATININE 0.84 09/15/2016 0819   CALCIUM 9.4 09/15/2016 0819   PROT 6.6 09/15/2016 0819   ALBUMIN 3.6 09/15/2016 0819   AST 21 09/15/2016 0819   ALT 19 09/15/2016 0819   ALKPHOS 47 09/15/2016 0819   BILITOT 1.1 09/15/2016 0819   GFRNONAA >60 09/15/2016 0819   GFRAA >60 09/15/2016 0819     Impression:  The patient is tolerating radiotherapy.   Plan:  Continue radiotherapy as planned. I refilled lidocaine as follows: Mix 1part 2% viscous  lidocaine, 1part H20. Swish and swallow 29m of this mixture, 31m before meals and at bedtime, up to QID. Disp 100 mL with 5 refills -----------------------------------  SaEppie GibsonMD

## 2016-11-11 ENCOUNTER — Ambulatory Visit
Admission: RE | Admit: 2016-11-11 | Discharge: 2016-11-11 | Disposition: A | Discharge status: Home / Self Care | Payer: Medicare Other | Source: Ambulatory Visit | Attending: Radiation Oncology | Admitting: Radiation Oncology

## 2016-11-11 DIAGNOSIS — C321 Malignant neoplasm of supraglottis: Secondary | ICD-10-CM | POA: Diagnosis not present

## 2016-11-12 ENCOUNTER — Ambulatory Visit
Admission: RE | Admit: 2016-11-12 | Discharge: 2016-11-12 | Disposition: A | Discharge status: Home / Self Care | Payer: Medicare Other | Source: Ambulatory Visit | Attending: Radiation Oncology | Admitting: Radiation Oncology

## 2016-11-12 ENCOUNTER — Ambulatory Visit: Payer: Medicare Other | Admitting: Nutrition

## 2016-11-12 DIAGNOSIS — C321 Malignant neoplasm of supraglottis: Secondary | ICD-10-CM | POA: Diagnosis not present

## 2016-11-12 NOTE — Progress Notes (Signed)
Nutrition follow up completed with patient after radiation for supraglottic cancer. He reports increased pain in throat today. Lidocaine is helping him eat. Continues to tolerate soft diet. He drinks one nutrition shake daily. Weight improved and documented as 229.4 pounds on March 12.  Nutrition Diagnosis: Food and Nutrition Related Knowledge Deficit improved.  Intervention: Recommended he discuss increased pain with MD. Encouraged him to continue protein shakes. Recommended he continue baking soda and salt water rinses to improve thickened saliva. Questions were answered.  Teach back method used.  Monitoring, evaluation, goals: Patient will tolerate adequate calories and protein to minimize weight loss and avoid treatment breaks.  Next visit: Wednesday, March 21.  After radiation therapy.  **Disclaimer: This note was dictated with voice recognition software. Similar sounding words can inadvertently be transcribed and this note may contain transcription errors which may not have been corrected upon publication of note.**

## 2016-11-13 ENCOUNTER — Ambulatory Visit
Admission: RE | Admit: 2016-11-13 | Discharge: 2016-11-13 | Disposition: A | Discharge status: Home / Self Care | Payer: Medicare Other | Source: Ambulatory Visit | Attending: Radiation Oncology | Admitting: Radiation Oncology

## 2016-11-13 DIAGNOSIS — C321 Malignant neoplasm of supraglottis: Secondary | ICD-10-CM | POA: Diagnosis not present

## 2016-11-14 ENCOUNTER — Ambulatory Visit
Admission: RE | Admit: 2016-11-14 | Discharge: 2016-11-14 | Disposition: A | Discharge status: Home / Self Care | Payer: Medicare Other | Source: Ambulatory Visit | Attending: Radiation Oncology | Admitting: Radiation Oncology

## 2016-11-14 ENCOUNTER — Other Ambulatory Visit: Payer: Self-pay | Admitting: Radiation Oncology

## 2016-11-14 DIAGNOSIS — C321 Malignant neoplasm of supraglottis: Secondary | ICD-10-CM

## 2016-11-14 MED ORDER — SUCRALFATE 1 G PO TABS: ORAL_TABLET | ORAL | 5 refills | Status: DC

## 2016-11-14 MED ORDER — HYDROCODONE-ACETAMINOPHEN 10-325 MG PO TABS: 1.0000 | ORAL_TABLET | ORAL | 0 refills | Status: DC | PRN

## 2016-11-17 ENCOUNTER — Ambulatory Visit: Payer: Medicare Other | Attending: Radiation Oncology

## 2016-11-17 ENCOUNTER — Telehealth: Payer: Self-pay | Admitting: *Deleted

## 2016-11-17 ENCOUNTER — Encounter: Payer: Self-pay | Admitting: Radiation Oncology

## 2016-11-17 ENCOUNTER — Ambulatory Visit
Admission: RE | Admit: 2016-11-17 | Discharge: 2016-11-17 | Disposition: A | Discharge status: Home / Self Care | Payer: Medicare Other | Source: Ambulatory Visit | Attending: Radiation Oncology | Admitting: Radiation Oncology

## 2016-11-17 ENCOUNTER — Ambulatory Visit: Payer: Medicare Other

## 2016-11-17 ENCOUNTER — Encounter: Payer: Self-pay | Admitting: *Deleted

## 2016-11-17 VITALS — Temp 98.0°F | Ht 73.0 in | Wt 222.0 lb

## 2016-11-17 DIAGNOSIS — C321 Malignant neoplasm of supraglottis: Secondary | ICD-10-CM | POA: Diagnosis not present

## 2016-11-17 DIAGNOSIS — R1310 Dysphagia, unspecified: Secondary | ICD-10-CM | POA: Diagnosis present

## 2016-11-17 MED ORDER — HYDROCODONE-ACETAMINOPHEN 10-325 MG PO TABS: 1.0000 | ORAL_TABLET | ORAL | 0 refills | Status: DC | PRN

## 2016-11-17 MED ORDER — BIAFINE EX EMUL
CUTANEOUS | Status: DC | PRN
Start: 2016-11-17 — End: 2016-11-18
  Administered 2016-11-17: 13:00:00 via TOPICAL

## 2016-11-17 NOTE — Telephone Encounter (Signed)
CALLED PATIENT TO INFORM OF LAB ON 11-18-16 @ 8 AM BEFORE TX., SPOKE WITH PATIENT'S GIRLFRIEND SHARON LINDER AND SHE STATED THAT IT Keyser BE A PROBLEM.

## 2016-11-17 NOTE — Progress Notes (Signed)

## 2016-11-17 NOTE — Therapy (Signed)
[facility] [address] [place], [place], [address] Phone: [phone]   Fax:  [phone]  Speech Language Pathology Treatment  Patient Details  Name: Kevin Martin MRN: [id number] Date of Birth: May 23, 1951 Referring Provider: Eppie Gibson, MD  Encounter Date: 11/17/2016      End of Session - 11/17/16 1119    Visit Number 2   Number of Visits 3   Date for SLP Re-Evaluation 12/19/16   SLP Start Time 1884   SLP Stop Time  1107   SLP Time Calculation (min) 27 min   Activity Tolerance Patient tolerated treatment well      Past Medical History:  Diagnosis Date  . Aortic stenosis    moderate by 12/16/15 echo  . Atrial fibrillation ([place])   . Cardiomegaly   . CHF (congestive heart failure) ([place])   . Hypertension   . Shortness of breath dyspnea     Past Surgical History:  Procedure Laterality Date  . COLONOSCOPY    . DIRECT LARYNGOSCOPY N/A 09/19/2016   Procedure: DIRECT LARYNGOSCOPY with BIOPSY;  Surgeon: Izora Gala, MD;  Location: [facility];  Service: ENT;  Laterality: N/A;  . ESOPHAGOSCOPY N/A 09/19/2016   Procedure: ESOPHAGOSCOPY;  Surgeon: Izora Gala, MD;  Location: [facility];  Service: ENT;  Laterality: N/A;  . FRACTURE SURGERY Right    right arm  . NECK SURGERY      There were no vitals filed for this visit.      Subjective Assessment - 11/17/16 1040    Subjective "I do thiem every day, twice a day." (Pt, re: HEP)   Currently in Pain? No/denies               ADULT SLP TREATMENT - 11/17/16 1051      General Information   Behavior/Cognition Alert;Cooperative;Pleasant mood     Treatment Provided   Treatment provided Dysphagia     Dysphagia Treatment   Temperature Spikes Noted No   Respiratory Status Room air   Treatment Methods Therapeutic exercise;Skilled observation;Patient/caregiver education   Patient observed directly with PO's Yes   Type of PO's observed Thin liquids   Liquids  provided via Cup   Oral Phase Signs & Symptoms --  none noted   Pharyngeal Phase Signs & Symptoms --  none noted   Other treatment/comments Pt completed HEP with modified independence.  He tells SLP why he needs to complete HEP with modified independence. SLP provided education today re: using gravies, non-vinegar/tomato-based sauces for pharyngeal clearance, as well as using olive oil or coconut oil for medication clearance through pharynx. No overt s/s aspiration today. SLP also told pt/SO that if pt cannot complete rec reps, to focus reps on non-swallowing exercises on HEP.      Assessment / Recommendations / Plan   Plan Continue with current plan of care     Dysphagia Recommendations   Diet recommendations --  as tolerated   Liquids provided via Cup   Medication Administration Whole meds with liquid  or puree   Compensations Effortful swallow     Progression Toward Goals   Progression toward goals Progressing toward goals          SLP Education - 11/17/16 1117    Education provided Yes   Education Details hints for pharyngeal clearance, focus on non-swallowing exercises of HEP if absolutely necessary   Person(s) Educated Patient  SO   Methods Explanation   Comprehension Verbalized understanding  SLP Short Term Goals - 11/17/16 1121      SLP SHORT TERM GOAL #1   Title pt will complete HEP with rare min A   Status Achieved     SLP SHORT TERM GOAL #2   Title pt will tell SLP why  he is completing HEP   Status Achieved     SLP SHORT TERM GOAL #3   Title pt will tell SLP foods he could attempt to begin to return to WNL variety of food textures   Time 1   Period --  visits   Status Deferred  talk about next session (12-15-16)          SLP Long Term Goals - 11/17/16 1122      SLP LONG TERM GOAL #1   Title pt will complete HEP with modified independnce over two visits   Time 2   Period --  visits   Status Revised     SLP LONG TERM GOAL #2    Title pt will tell SLP 3 overt s/s aspiration PNA with modified independence   Time 2   Period --  visits   Status On-going     SLP LONG TERM GOAL #3   Title pt will tell SLP how long he must complete HEP >x4/week (6 monhts after last day rad tx)   Time 2   Period --  visits   Status On-going          Plan - 11/17/16 1120    Clinical Impression Statement Pt presetns with swallowing WFL/WNL except for odynophagia rated 5/10 (10=worst pain ever) when swallowing. Pt has done excellently with HEP to this point. SLP educated pt/SO today (see "education" for details). He will require skilled ST to cont to assess safety with POs, and correct completion of HEP.   Speech Therapy Frequency --  1 additional visit, approx 4 weeks from today   Duration --  3 visits   Treatment/Interventions Aspiration precaution training;Pharyngeal strengthening exercises;Diet toleration management by SLP;Compensatory techniques;Trials of upgraded texture/liquids;Cueing hierarchy;Patient/family education;Oral motor exercises;SLP instruction and feedback   Potential to Achieve Goals Good      Patient will benefit from skilled therapeutic intervention in order to improve the following deficits and impairments:   Dysphagia, unspecified type    Problem List Patient Active Problem List   Diagnosis Date Noted  . Multiple lung nodules on CT 10/10/2016  . Alcoholism ([place]) 10/10/2016  . 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 Chronic atrial fibrillation ([place]) 02/14/2015  . Chronic diastolic CHF (congestive heart failure) ([place]) 02/14/2015  . Chronic diastolic heart failure ([place]) 02/14/2015  . Prediabetes 01/23/2015  . Tobacco abuse 01/23/2015  . Acute on chronic congestive heart failure ([place]) 01/15/2015  . Acute congestive heart failure with left ventricular diastolic dysfunction ([place]) 01/14/2015  . Alcohol abuse, daily use 01/14/2015  . Acute respiratory failure ([place]) 01/14/2015  . Acute hyponatremia  01/14/2015  . Acute right heart failure 01/14/2015  . Congestive heart disease ([place])   . Right heart failure 01/03/2015  . Cardiomegaly 01/03/2015  . Atrial fibrillation, controlled ([place]) 01/03/2015  . Moderate aortic stenosis 01/03/2015  . Chronic anticoagulation 01/03/2015  . Hypertension 01/03/2015  . Dyspnea 01/03/2015  . A-fib ([place]) 02/17/2014  . Long term current use of anticoagulant 02/17/2014  . Essential (primary) hypertension 11/01/2013  . Adult BMI 30+ 10/21/2013  . Adiposity 04/22/2013  . H/O cardiovascular disorder 04/22/2013  . Headache, tension-type 02/05/2013    [place] ,[place], CCC-SLP  11/17/2016,  11:23 AM  [facility] [address] [place], [place], [address] Phone: [phone]   Fax:  [phone]   Name: Kevin Martin MRN: [id number] Date of Birth: 1951-08-13

## 2016-11-17 NOTE — Progress Notes (Signed)
   Weekly Management Note:  Outpatient    ICD-9-CM ICD-10-CM   1. Cancer of supraglottis (HCC) 161.1 C32.1 topical emolient (BIAFINE) emulsion     HYDROcodone-acetaminophen (NORCO) 10-325 MG tablet    Current Dose: 34Gy  Projected Dose: 70 Gy   Narrative:  The patient presents for routine under treatment assessment.  CBCT/MVCT images/Port film x-rays were reviewed.  The chart was checked. Pain alleviated by current dosing of norco. Weight loss, drinking and eating less.  Physical Findings:  Wt Readings from Last 3 Encounters:  11/17/16 222 lb (100.7 kg)  11/10/16 229 lb 6.4 oz (104.1 kg)  11/03/16 226 lb 9.6 oz (102.8 kg)    height is '6\' 1"'$  (1.854 m) and weight is 222 lb (100.7 kg). His temperature is 98 F (36.7 C). His oxygen saturation is 100%.  NAD, uvula with patchy mucositis    CBC    Component Value Date/Time   WBC 9.0 09/15/2016 0819   RBC 4.84 09/15/2016 0819   HGB 14.8 09/15/2016 0819   HCT 43.4 09/15/2016 0819   PLT 183 09/15/2016 0819   MCV 89.7 09/15/2016 0819   MCH 30.6 09/15/2016 0819   MCHC 34.1 09/15/2016 0819   RDW 13.4 09/15/2016 0819   LYMPHSABS 1.1 01/14/2015 1239   MONOABS 0.7 01/14/2015 1239   EOSABS 0.0 01/14/2015 1239   BASOSABS 0.0 01/14/2015 1239     CMP     Component Value Date/Time   NA 137 09/15/2016 0819   K 4.4 09/15/2016 0819   CL 103 09/15/2016 0819   CO2 27 09/15/2016 0819   GLUCOSE 125 (H) 09/15/2016 0819   BUN 14 09/15/2016 0819   CREATININE 0.84 09/15/2016 0819   CALCIUM 9.4 09/15/2016 0819   PROT 6.6 09/15/2016 0819   ALBUMIN 3.6 09/15/2016 0819   AST 21 09/15/2016 0819   ALT 19 09/15/2016 0819   ALKPHOS 47 09/15/2016 0819   BILITOT 1.1 09/15/2016 0819   GFRNONAA >60 09/15/2016 0819   GFRAA >60 09/15/2016 0819     Impression:  The patient is tolerating radiotherapy.   Plan:  Continue radiotherapy as planned. Refilled norco. Advised on hydration and nutrition. f/u with Dory Peru. Labs tomorrow to recheck  BMP.Marland Kitchen -----------------------------------  Eppie Gibson, MD

## 2016-11-17 NOTE — Progress Notes (Signed)
Mr. Crable presents for his 17th fraction of radiation to his head and neck. He denies pain at this time, but took hydrocodone 2 hours ago. He does get relief with the hydrocodone. He is using lidocaine and carafate. He is not eating well. He is drinking mostly liquid foods. He is drinking one ensure daily. His wife has also fixed him carnation instant breakfast with whole milk and ice cream. He is not drinking much water, but tells me today he plans to begin drinking more today. I have encouraged him to increase his ensure intake, and has his next appointment with the Dietician on Wednesday. He reports taste changes which have also altered his appetite and eating. The skin to his neck is red, and he has some peeling to his Left neck area. He is using biafine twice daily and was given another tube today. He also reports constipation with his last bowel movement this past Saturday. He will begin taking miralax twice daily until he has a bowel movement and then take it once daily. He was given a sheet on constipation today.   Temp 98 F (36.7 C)   Ht '6\' 1"'$  (1.854 m)   Wt 222 lb (100.7 kg)   SpO2 100% Comment: room air  BMI 29.29 kg/m    Orthostatics: BP sitting 138/62 pulse 55. BP standing 140/50 pulse 54.   Wt Readings from Last 3 Encounters:  11/17/16 222 lb (100.7 kg)  11/10/16 229 lb 6.4 oz (104.1 kg)  11/03/16 226 lb 9.6 oz (102.8 kg)

## 2016-11-18 ENCOUNTER — Ambulatory Visit
Admission: RE | Admit: 2016-11-18 | Discharge: 2016-11-18 | Disposition: A | Discharge status: Home / Self Care | Payer: Medicare Other | Source: Ambulatory Visit | Attending: Radiation Oncology | Admitting: Radiation Oncology

## 2016-11-18 DIAGNOSIS — C321 Malignant neoplasm of supraglottis: Secondary | ICD-10-CM | POA: Diagnosis not present

## 2016-11-18 LAB — BASIC METABOLIC PANEL
ANION GAP: 11 meq/L (ref 3–11)
BUN: 15.4 mg/dL (ref 7.0–26.0)
CALCIUM: 9.6 mg/dL (ref 8.4–10.4)
CO2: 28 mEq/L (ref 22–29)
CREATININE: 0.9 mg/dL (ref 0.7–1.3)
Chloride: 98 mEq/L (ref 98–109)
GLUCOSE: 122 mg/dL (ref 70–140)
Potassium: 4.4 mEq/L (ref 3.5–5.1)
Sodium: 136 mEq/L (ref 136–145)

## 2016-11-19 ENCOUNTER — Ambulatory Visit: Payer: Medicare Other | Admitting: Nutrition

## 2016-11-19 ENCOUNTER — Ambulatory Visit
Admission: RE | Admit: 2016-11-19 | Discharge: 2016-11-19 | Disposition: A | Discharge status: Home / Self Care | Payer: Medicare Other | Source: Ambulatory Visit | Attending: Radiation Oncology | Admitting: Radiation Oncology

## 2016-11-19 DIAGNOSIS — C321 Malignant neoplasm of supraglottis: Secondary | ICD-10-CM | POA: Diagnosis not present

## 2016-11-19 NOTE — Progress Notes (Signed)
Nutrition follow-up completed with patient after radiation therapy for supraglottic cancer. Weight has decreased and was documented as 222 pounds March 19 decreased from 229.4 pounds March 12. Patient reports oral intake has decreased significantly. He is drinking approximately 36 ounces of water a day. Reports some nausea and spitting after eating.  Nutrition diagnosis: Food and nutrition related knowledge deficit continues.  Intervention: Stressed the importance of increasing oral intake to minimize further weight loss. Explained the patient will need to consume 7 Ensure Plus a day in order to meet calorie needs. Patient verbalizes he can consume more Ensure Plus and promises to try to drink one every 2 hours. Provided one complementary case of Ensure Plus along with coupons. Educated patient on strategies for decreasing thick saliva.  Monitoring, evaluation, goals:  Patient will tolerate increased calories and protein and attempt to drink 7 Ensure Plus a day to minimize weight loss.  Next visit: Wednesday, March 28 after radiation therapy.  **Disclaimer: This note was dictated with voice recognition software. Similar sounding words can inadvertently be transcribed and this note may contain transcription errors which may not have been corrected upon publication of note.**

## 2016-11-20 ENCOUNTER — Ambulatory Visit
Admission: RE | Admit: 2016-11-20 | Discharge: 2016-11-20 | Disposition: A | Discharge status: Home / Self Care | Payer: Medicare Other | Source: Ambulatory Visit | Attending: Radiation Oncology | Admitting: Radiation Oncology

## 2016-11-20 DIAGNOSIS — C321 Malignant neoplasm of supraglottis: Secondary | ICD-10-CM | POA: Diagnosis not present

## 2016-11-21 ENCOUNTER — Ambulatory Visit
Admission: RE | Admit: 2016-11-21 | Discharge: 2016-11-21 | Disposition: A | Discharge status: Home / Self Care | Payer: Medicare Other | Source: Ambulatory Visit | Attending: Radiation Oncology | Admitting: Radiation Oncology

## 2016-11-21 DIAGNOSIS — C321 Malignant neoplasm of supraglottis: Secondary | ICD-10-CM | POA: Diagnosis not present

## 2016-11-21 NOTE — Progress Notes (Signed)
Oncology Nurse Navigator Documentation  Met with patient during Weekly Under Treat appointment with Dr. Isidore Moos.  He was accompanied by his wife. He reported:  7 # weight loss since last week.  He was encouraged to increase intake of soft high-protein/calorie foods and water.  Taking hydrocodone-acetaminophine q 3h for throat pain with resolution.  Last BM this past Saturday.  He was encouraged twice daily Miralax until BM, daily thereafter.  Difficulty swallowing some medications.  Suggested pudding/applesauce to make swallowing easier.  Encouraged crushing/dissolving medications to facilitate ingestion, recommended calling pharmacist to determine which meds applicable. Provided invitation to upcoming H&N [facility], discussed upcoming H&N [facility], encouraged them to consider attending both. They understand to contact me with needs/concerns.  Gayleen Orem, RN, BSN, [place] [facility] Oncology Nurse [place] at [place] [phone]

## 2016-11-24 ENCOUNTER — Ambulatory Visit
Admission: RE | Admit: 2016-11-24 | Discharge: 2016-11-24 | Disposition: A | Discharge status: Home / Self Care | Payer: Medicare Other | Source: Ambulatory Visit | Attending: Radiation Oncology | Admitting: Radiation Oncology

## 2016-11-24 DIAGNOSIS — C321 Malignant neoplasm of supraglottis: Secondary | ICD-10-CM | POA: Diagnosis not present

## 2016-11-25 ENCOUNTER — Other Ambulatory Visit: Payer: Self-pay | Admitting: Radiation Oncology

## 2016-11-25 ENCOUNTER — Ambulatory Visit
Admission: RE | Admit: 2016-11-25 | Discharge: 2016-11-25 | Disposition: A | Discharge status: Home / Self Care | Payer: Medicare Other | Source: Ambulatory Visit | Attending: Radiation Oncology | Admitting: Radiation Oncology

## 2016-11-25 ENCOUNTER — Encounter: Payer: Self-pay | Admitting: Radiation Oncology

## 2016-11-25 DIAGNOSIS — C321 Malignant neoplasm of supraglottis: Secondary | ICD-10-CM

## 2016-11-25 MED ORDER — BIAFINE EX EMUL
Freq: Three times a day (TID) | CUTANEOUS | Status: DC
  Administered 2016-11-25: 10:00:00 via TOPICAL

## 2016-11-25 MED ORDER — HYDROCODONE-ACETAMINOPHEN 10-325 MG PO TABS: 1.0000 | ORAL_TABLET | ORAL | 0 refills | Status: DC | PRN

## 2016-11-26 ENCOUNTER — Ambulatory Visit: Payer: Medicare Other | Admitting: Nutrition

## 2016-11-26 ENCOUNTER — Ambulatory Visit
Admission: RE | Admit: 2016-11-26 | Discharge: 2016-11-26 | Disposition: A | Discharge status: Home / Self Care | Payer: Medicare Other | Source: Ambulatory Visit | Attending: Radiation Oncology | Admitting: Radiation Oncology

## 2016-11-26 DIAGNOSIS — C321 Malignant neoplasm of supraglottis: Secondary | ICD-10-CM | POA: Diagnosis not present

## 2016-11-26 NOTE — Progress Notes (Signed)
Nutrition follow-up completed with patient after radiation therapy for supraglottic cancer. Per patient weight has increased and was documented as 224.8 pounds in radiation oncology. This is up 2.8 pounds from March 19. Patient reports he is drinking between 6 and 7 boost plus daily. He is trying to eat other food but taste alterations prevent this. Patient denies other nutrition impact symptoms.  Nutrition diagnosis: Food and nutrition related knowledge deficit improved.  Intervention: Recommended patient continue 6 to 7 oral nutrition supplements daily. Teach back method used.  Monitoring, evaluation, goals: Patient will continue increased calories and protein to minimize further weight loss.  Next visit:  Wednesday April for, after radiation therapy.  **Disclaimer: This note was dictated with voice recognition software. Similar sounding words can inadvertently be transcribed and this note may contain transcription errors which may not have been corrected upon publication of note.**

## 2016-11-27 ENCOUNTER — Ambulatory Visit
Admission: RE | Admit: 2016-11-27 | Discharge: 2016-11-27 | Disposition: A | Discharge status: Home / Self Care | Payer: Medicare Other | Source: Ambulatory Visit | Attending: Radiation Oncology | Admitting: Radiation Oncology

## 2016-11-27 DIAGNOSIS — C321 Malignant neoplasm of supraglottis: Secondary | ICD-10-CM | POA: Diagnosis not present

## 2016-11-28 ENCOUNTER — Ambulatory Visit
Admission: RE | Admit: 2016-11-28 | Discharge: 2016-11-28 | Disposition: A | Discharge status: Home / Self Care | Payer: Medicare Other | Source: Ambulatory Visit | Attending: Radiation Oncology | Admitting: Radiation Oncology

## 2016-11-28 DIAGNOSIS — C321 Malignant neoplasm of supraglottis: Secondary | ICD-10-CM | POA: Diagnosis not present

## 2016-12-01 ENCOUNTER — Ambulatory Visit
Admission: RE | Admit: 2016-12-01 | Discharge: 2016-12-01 | Disposition: A | Discharge status: Home / Self Care | Payer: Medicare Other | Source: Ambulatory Visit | Attending: Radiation Oncology | Admitting: Radiation Oncology

## 2016-12-01 ENCOUNTER — Encounter: Payer: Self-pay | Admitting: Radiation Oncology

## 2016-12-01 VITALS — BP 130/51 | HR 53 | Temp 98.4°F | Resp 18 | Ht 73.0 in | Wt 220.0 lb

## 2016-12-01 DIAGNOSIS — I517 Cardiomegaly: Secondary | ICD-10-CM | POA: Diagnosis not present

## 2016-12-01 DIAGNOSIS — I4891 Unspecified atrial fibrillation: Secondary | ICD-10-CM | POA: Diagnosis not present

## 2016-12-01 DIAGNOSIS — I350 Nonrheumatic aortic (valve) stenosis: Secondary | ICD-10-CM | POA: Diagnosis not present

## 2016-12-01 DIAGNOSIS — I700 Atherosclerosis of aorta: Secondary | ICD-10-CM | POA: Diagnosis not present

## 2016-12-01 DIAGNOSIS — Z9889 Other specified postprocedural states: Secondary | ICD-10-CM | POA: Diagnosis not present

## 2016-12-01 DIAGNOSIS — I110 Hypertensive heart disease with heart failure: Secondary | ICD-10-CM | POA: Diagnosis not present

## 2016-12-01 DIAGNOSIS — Z7901 Long term (current) use of anticoagulants: Secondary | ICD-10-CM | POA: Diagnosis not present

## 2016-12-01 DIAGNOSIS — C321 Malignant neoplasm of supraglottis: Secondary | ICD-10-CM | POA: Diagnosis not present

## 2016-12-01 DIAGNOSIS — N433 Hydrocele, unspecified: Secondary | ICD-10-CM | POA: Diagnosis not present

## 2016-12-01 DIAGNOSIS — Z87891 Personal history of nicotine dependence: Secondary | ICD-10-CM | POA: Diagnosis not present

## 2016-12-01 DIAGNOSIS — I2510 Atherosclerotic heart disease of native coronary artery without angina pectoris: Secondary | ICD-10-CM | POA: Diagnosis not present

## 2016-12-01 DIAGNOSIS — I509 Heart failure, unspecified: Secondary | ICD-10-CM | POA: Diagnosis not present

## 2016-12-01 MED ORDER — BIAFINE EX EMUL
CUTANEOUS | Status: DC | PRN
  Administered 2016-12-01: 10:00:00 via TOPICAL

## 2016-12-01 MED ORDER — HYDROCODONE-ACETAMINOPHEN 10-325 MG PO TABS: 1.0000 | ORAL_TABLET | ORAL | 0 refills | Status: DC | PRN

## 2016-12-01 NOTE — Progress Notes (Signed)
   Weekly Management Note:  Outpatient    ICD-9-CM ICD-10-CM   1. Cancer of supraglottis (HCC) 161.1 C32.1     Current Dose: 54Gy  Projected Dose: 70 Gy   Narrative:  The patient presents for routine under treatment assessment.  CBCT/MVCT images/Port film x-rays were reviewed.  The chart was checked. Patient reports 2/10 pain the the neck that he manages with hydrocodone with relief. He is using Lidocaine and Carafate. He is drinking mostly liquid foods and not eating well. He reports drinking boost x 7 per day as well as 36-48 oz of water. He reports taste changes that affect his appetite. Per nursing, the skin to the neck is read and peeling. He reports using biafine bid. His constipation has improved and he has begun taking Miralax.  Physical Findings:  Wt Readings from Last 3 Encounters:  12/01/16 220 lb (99.8 kg)  11/17/16 222 lb (100.7 kg)  11/10/16 229 lb 6.4 oz (104.1 kg)    height is '6\' 1"'$  (1.854 m) and weight is 220 lb (99.8 kg). His oral temperature is 98.4 F (36.9 C). His blood pressure is 130/51 (abnormal) and his pulse is 53 (abnormal). His respiration is 18 and oxygen saturation is 97%.  Erythema with dry desquamation in the treatment area. Patchy mucositis to the uvula area. No secondary infection  CBC    Component Value Date/Time   WBC 9.0 09/15/2016 0819   RBC 4.84 09/15/2016 0819   HGB 14.8 09/15/2016 0819   HCT 43.4 09/15/2016 0819   PLT 183 09/15/2016 0819   MCV 89.7 09/15/2016 0819   MCH 30.6 09/15/2016 0819   MCHC 34.1 09/15/2016 0819   RDW 13.4 09/15/2016 0819   LYMPHSABS 1.1 01/14/2015 1239   MONOABS 0.7 01/14/2015 1239   EOSABS 0.0 01/14/2015 1239   BASOSABS 0.0 01/14/2015 1239     CMP     Component Value Date/Time   NA 136 11/18/2016 0853   K 4.4 11/18/2016 0853   CL 103 09/15/2016 0819   CO2 28 11/18/2016 0853   GLUCOSE 122 11/18/2016 0853   BUN 15.4 11/18/2016 0853   CREATININE 0.9 11/18/2016 0853   CALCIUM 9.6 11/18/2016 0853   PROT  6.6 09/15/2016 0819   ALBUMIN 3.6 09/15/2016 0819   AST 21 09/15/2016 0819   ALT 19 09/15/2016 0819   ALKPHOS 47 09/15/2016 0819   BILITOT 1.1 09/15/2016 0819   GFRNONAA >60 09/15/2016 0819   GFRAA >60 09/15/2016 0819     Impression:  The patient is tolerating radiotherapy.   Plan:  Continue radiotherapy as planned. Prescribed refill of hydrocodone. Patient was given a refill of Biafine. -----------------------------------   This document serves as a record of services personally performed by Gery Pray, MD. It was created on his behalf by Bethann Humble, a trained medical scribe. The creation of this record is based on the scribe's personal observations and the provider's statements to them. This document has been checked and approved by the attending provider.

## 2016-12-01 NOTE — Progress Notes (Signed)
Pt seen during under treatment assessment with Dr. Sondra Come. He's doing well overall with treatment. Biafine and hydrocodone prescription provided to pt.     Carola Rhine, PAC

## 2016-12-01 NOTE — Progress Notes (Signed)
Kevin Martin presents for his 27th fraction of radiation to his head and neck. He reports pain 2/10 at this time to neck,taking hydrocodone . He does get relief with the hydrocodone. He is using lidocaine and carafate. He is not eating well. He is drinking mostly liquid foods. He is drinking boost seven a day. He is  drinking 36 to 48 oz. Water. He reports taste changes which have also altered his appetite and eating. The skin to his neck is red, and he has some peeling to his Left neck area. He is using biafine twice daily and was given another tube today. He  reports constipation is better. He will begin taking miralax. Wt Readings from Last 3 Encounters:  12/01/16 220 lb (99.8 kg)  11/17/16 222 lb (100.7 kg)  11/10/16 229 lb 6.4 oz (104.1 kg)  BP (!) 130/51   Pulse (!) 53   Temp 98.4 F (36.9 C) (Oral)   Resp 18   Ht '6\' 1"'$  (1.854 m)   Wt 220 lb (99.8 kg)   SpO2 97%   BMI 29.03 kg/m

## 2016-12-02 ENCOUNTER — Ambulatory Visit
Admission: RE | Admit: 2016-12-02 | Discharge: 2016-12-02 | Disposition: A | Discharge status: Home / Self Care | Payer: Medicare Other | Source: Ambulatory Visit | Attending: Radiation Oncology | Admitting: Radiation Oncology

## 2016-12-02 DIAGNOSIS — C321 Malignant neoplasm of supraglottis: Secondary | ICD-10-CM | POA: Diagnosis not present

## 2016-12-03 ENCOUNTER — Encounter: Payer: Medicare Other | Admitting: Nutrition

## 2016-12-03 ENCOUNTER — Ambulatory Visit
Admission: RE | Admit: 2016-12-03 | Discharge: 2016-12-03 | Disposition: A | Discharge status: Home / Self Care | Payer: Medicare Other | Source: Ambulatory Visit | Attending: Radiation Oncology | Admitting: Radiation Oncology

## 2016-12-03 DIAGNOSIS — C321 Malignant neoplasm of supraglottis: Secondary | ICD-10-CM | POA: Diagnosis not present

## 2016-12-04 ENCOUNTER — Ambulatory Visit
Admission: RE | Admit: 2016-12-04 | Discharge: 2016-12-04 | Disposition: A | Discharge status: Home / Self Care | Payer: Medicare Other | Source: Ambulatory Visit | Attending: Radiation Oncology | Admitting: Radiation Oncology

## 2016-12-04 DIAGNOSIS — C321 Malignant neoplasm of supraglottis: Secondary | ICD-10-CM | POA: Diagnosis not present

## 2016-12-05 ENCOUNTER — Ambulatory Visit
Admission: RE | Admit: 2016-12-05 | Discharge: 2016-12-05 | Disposition: A | Discharge status: Home / Self Care | Payer: Medicare Other | Source: Ambulatory Visit | Attending: Radiation Oncology | Admitting: Radiation Oncology

## 2016-12-05 DIAGNOSIS — C321 Malignant neoplasm of supraglottis: Secondary | ICD-10-CM | POA: Diagnosis not present

## 2016-12-08 ENCOUNTER — Other Ambulatory Visit: Payer: Self-pay | Admitting: Radiation Oncology

## 2016-12-08 ENCOUNTER — Ambulatory Visit
Admission: RE | Admit: 2016-12-08 | Discharge: 2016-12-08 | Disposition: A | Discharge status: Home / Self Care | Payer: Medicare Other | Source: Ambulatory Visit | Attending: Radiation Oncology | Admitting: Radiation Oncology

## 2016-12-08 DIAGNOSIS — C321 Malignant neoplasm of supraglottis: Secondary | ICD-10-CM

## 2016-12-08 MED ORDER — HYDROCODONE-ACETAMINOPHEN 10-325 MG PO TABS: 1.0000 | ORAL_TABLET | ORAL | 0 refills | Status: DC | PRN

## 2016-12-09 ENCOUNTER — Ambulatory Visit
Admission: RE | Admit: 2016-12-09 | Discharge: 2016-12-09 | Disposition: A | Discharge status: Home / Self Care | Payer: Medicare Other | Source: Ambulatory Visit | Attending: Radiation Oncology | Admitting: Radiation Oncology

## 2016-12-09 DIAGNOSIS — C321 Malignant neoplasm of supraglottis: Secondary | ICD-10-CM | POA: Diagnosis not present

## 2016-12-10 ENCOUNTER — Ambulatory Visit: Payer: Medicare Other

## 2016-12-10 ENCOUNTER — Ambulatory Visit: Payer: Medicare Other | Admitting: Nutrition

## 2016-12-10 ENCOUNTER — Ambulatory Visit
Admission: RE | Admit: 2016-12-10 | Discharge: 2016-12-10 | Disposition: A | Discharge status: Home / Self Care | Payer: Medicare Other | Source: Ambulatory Visit | Attending: Radiation Oncology | Admitting: Radiation Oncology

## 2016-12-10 DIAGNOSIS — C321 Malignant neoplasm of supraglottis: Secondary | ICD-10-CM | POA: Diagnosis not present

## 2016-12-10 NOTE — Progress Notes (Signed)
Nutrition follow-up completed with patient after radiation therapy for supraglottic cancer. Weight was documented as 218.8 pounds April 11 decreased from 220 pounds April 2 Patient reports he continues to drink between 6 and 7 boost plus daily. He is trying to eat other foods but taste alterations prevent him from increasing oral intake. Patient denies nausea vomiting diarrhea or constipation.  Final radiation treatment.  Scheduled for Thursday, April 12.  Nutrition diagnosis: Food and nutrition related knowledge deficit has improved.  Intervention: Patient educated to consistently drink 7 boost plus daily to provide 2520 cal, 98 g of protein. Educated patient to continue to try soft smooth foods as tolerated and slowly advance diet as tolerated. Encouraged increased water intake. Teach back method used.  Monitoring, evaluation, goals: Patient will work to increase calories and protein to minimize further weight loss.  Next visit: Patient will schedule follow-up as needed.  **Disclaimer: This note was dictated with voice recognition software. Similar sounding words can inadvertently be transcribed and this note may contain transcription errors which may not have been corrected upon publication of note.**

## 2016-12-11 ENCOUNTER — Ambulatory Visit
Admission: RE | Admit: 2016-12-11 | Discharge: 2016-12-11 | Disposition: A | Discharge status: Home / Self Care | Payer: Medicare Other | Source: Ambulatory Visit | Attending: Radiation Oncology | Admitting: Radiation Oncology

## 2016-12-11 ENCOUNTER — Encounter: Payer: Self-pay | Admitting: *Deleted

## 2016-12-11 DIAGNOSIS — C321 Malignant neoplasm of supraglottis: Secondary | ICD-10-CM | POA: Diagnosis not present

## 2016-12-11 NOTE — Progress Notes (Signed)
Oncology Nurse Navigator Documentation  Met with Kevin Martin during final RT to offer support and to celebrate end of radiation treatment.  He was accompanied by his SO, Kevin Martin. I provided Kevin Martin a Certificate of Recognition for her supportive care. I provided post-RT guidance:  Importance of keeping follow-up appts with Dr. Isidore Moos, Nutrition and SLP.  Importance of protecting treatment area from sun.  Continuation of Biafine application 2-3 times daily. I explained that my role as navigator will continue for several more months and that I will be calling and/or joining him during follow-up visits. I shared information re upcoming H&N [facility] program, encouraged then to attend/register.   I encouraged him to call me with needs/concerns.   They verbalized understanding of information provided.  Gayleen Orem, RN, BSN, [place] at [place] [phone]

## 2016-12-12 ENCOUNTER — Encounter: Payer: Self-pay | Admitting: Radiation Oncology

## 2016-12-12 NOTE — Progress Notes (Signed)
  Radiation Oncology         [phone]) [phone] ________________________________  Name: Kevin Martin MRN: [id number]  Date: 12/12/2016  DOB: 02-25-1951  End of Treatment Note  Diagnosis:   Clinical stage IVA (cT2, cN2b, cM0) cancer of supraglottis  Indication for treatment:  Curative       Radiation treatment dates:   10/23/16 - 12/11/16  Site/dose:   Laryngopharynx and Bilateral Neck gross disease treated to 70 Gy in 35 fractions (56-63Gy given to empiric nodal regions)  Beams/energy:   IMRT  //  6X  Narrative: The patient tolerated radiation treatment relatively well. He experienced some pain to the neck, which he rated 2/10 in severity. This was managed with hydrocodone. The patient experienced difficulty swallowing foods throughout treatment, and drank 7 Boost nutritional shakes per day, as well as 36-48 oz of water daily. The patient experienced radiation related skin changes including erythema with dry desquamation in the treatment area, as well as patchy mucositis to the uvula area.   Plan: The patient has completed radiation treatment. The patient was instructed to continue using Biafine to the skin within the treatment fields. He will continue to use hydrocodone as directed for pain management. The patient will return to radiation oncology clinic for routine followup in one half month. I advised them to call or return sooner if they have any questions or concerns related to their recovery or treatment.  -----------------------------------  Eppie Gibson, MD  This document serves as a record of services personally performed by Eppie Gibson, MD. It was created on her behalf by Maryla Morrow, a trained medical scribe. The creation of this record is based on the scribe's personal observations and the provider's statements to them. This document has been checked and approved by the attending provider.

## 2016-12-15 ENCOUNTER — Ambulatory Visit: Payer: Medicare Other

## 2016-12-15 ENCOUNTER — Ambulatory Visit: Payer: Medicare Other | Attending: Radiation Oncology

## 2016-12-15 DIAGNOSIS — R1310 Dysphagia, unspecified: Secondary | ICD-10-CM

## 2016-12-15 DIAGNOSIS — C321 Malignant neoplasm of supraglottis: Secondary | ICD-10-CM | POA: Diagnosis not present

## 2016-12-15 NOTE — Therapy (Signed)
[facility] [address] [place], [place], [address] Phone: [phone]   Fax:  [phone]  Speech Language Pathology Treatment  Patient Details  Name: Kevin Martin MRN: [id number] Date of Birth: [birth year] Referring Provider: Eppie Gibson, MD  Encounter Date: 12/15/2016      End of Session - 12/15/16 1438    Visit Number 3   Number of Visits 5   Date for SLP Re-Evaluation 02/27/17   SLP Start Time 14   SLP Stop Time  1428   SLP Time Calculation (min) 27 min   Activity Tolerance Patient tolerated treatment well      Past Medical History:  Diagnosis Date  . Aortic stenosis    moderate by 12/16/15 echo  . Atrial fibrillation ([place])   . Cardiomegaly   . CHF (congestive heart failure) ([place])   . Hypertension   . Shortness of breath dyspnea     Past Surgical History:  Procedure Laterality Date  . COLONOSCOPY    . DIRECT LARYNGOSCOPY N/A 09/19/2016   Procedure: DIRECT LARYNGOSCOPY with BIOPSY;  Surgeon: Izora Gala, MD;  Location: [facility];  Service: ENT;  Laterality: N/A;  . ESOPHAGOSCOPY N/A 09/19/2016   Procedure: ESOPHAGOSCOPY;  Surgeon: Izora Gala, MD;  Location: [place];  Service: ENT;  Laterality: N/A;  . FRACTURE SURGERY Right    right arm  . NECK SURGERY      There were no vitals filed for this visit.      Subjective Assessment - 12/15/16 1405    Subjective Pt cont to report completion of HEP x2/day. Ageusia and xerostomia per pt.               ADULT SLP TREATMENT - 12/15/16 1406      General Information   Behavior/Cognition Alert;Cooperative;Pleasant mood     Treatment Provided   Treatment provided Dysphagia     Dysphagia Treatment   Temperature Spikes Noted No   Respiratory Status Room air   Treatment Methods Therapeutic exercise;Skilled observation   Patient observed directly with PO's Yes   Type of PO's observed Thin liquids  pt politely refused solids due to ageusia   Liquids  provided via --  bottle   Oral Phase Signs & Symptoms --  none noted   Pharyngeal Phase Signs & Symptoms --  none noted   Other treatment/comments No overt s/s aspiration with sips H2O today. Pt req'd SBA re: vocal adduction, but remainder of HEP was completed with excellent success procedurally.  Pt repoted his main difficulty with POs is the ageusia/dysgeusia and he politely refused any POs today. SLP encouraged him to try new items every few days.      Assessment / Recommendations / Plan   Plan Continue with current plan of care     Dysphagia Recommendations   Diet recommendations --  as tolerated   Liquids provided via Cup   Medication Administration Whole meds with liquid     Progression Toward Goals   Progression toward goals Progressing toward goals          SLP Education - 12/15/16 1431    Education provided Yes   Education Details try different foods every one-two days or so, modify HEP frequency from 6 days/week to 2 days/week at 6 months post last day of radiation   Person(s) Educated Patient   Methods Explanation   Comprehension Verbalized understanding          SLP Short Term Goals - 12/15/16 1435  SLP SHORT TERM GOAL #1   Title pt will complete HEP with rare min A   Status Achieved     SLP SHORT TERM GOAL #2   Title pt will tell SLP why  he is completing HEP   Status Achieved     SLP SHORT TERM GOAL #3   Title pt will tell SLP foods he could attempt to begin to return to WNL variety of food textures   Status Deferred          SLP Long Term Goals - 12/15/16 1417      SLP LONG TERM GOAL #1   Title pt will complete HEP with modified independnce over two visits   Baseline 12-15-16   Time 1   Period --  visits   Status On-going     SLP LONG TERM GOAL #2   Title pt will tell SLP 3 overt s/s aspiration PNA with modified independence   Time --   Period --   Status Achieved     SLP LONG TERM GOAL #3   Title pt will tell SLP how long he  must complete HEP >x4/week (6 monhts after last day rad tx)   Time 1   Period --  visits   Status On-going          Plan - 12/15/16 1432    Clinical Impression Statement Pt presetns with swallowing WFL/WNL. Pt reports his PO intake has mainly to do with lack of taste. Pt has done excellently with HEP to this point. Water wsa swallowed today without overt s/s aspiration. He will cont to require skilled ST to cont to assess safety with POs, and correct completion of HEP, however pt will be seen next intwo month's time due to progress with HEP./   Speech Therapy Frequency --  once approx every 4-8 weeks   Duration --  3 visits   Treatment/Interventions Aspiration precaution training;Pharyngeal strengthening exercises;Diet toleration management by SLP;Compensatory techniques;Trials of upgraded texture/liquids;Cueing hierarchy;Patient/family education;Oral motor exercises;SLP instruction and feedback   Potential to Achieve Goals Good      Patient will benefit from skilled therapeutic intervention in order to improve the following deficits and impairments:   Dysphagia, unspecified type    Problem List Patient Active Problem List   Diagnosis Date Noted  . Multiple lung nodules on CT 10/10/2016  . Alcoholism ([place]) 10/10/2016  . 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 Chronic atrial fibrillation ([place]) 02/14/2015  . Chronic diastolic CHF (congestive heart failure) ([place]) 02/14/2015  . Chronic diastolic heart failure ([place]) 02/14/2015  . Prediabetes 01/23/2015  . Tobacco abuse 01/23/2015  . Acute on chronic congestive heart failure ([place]) 01/15/2015  . Acute congestive heart failure with left ventricular diastolic dysfunction ([place]) 01/14/2015  . Alcohol abuse, daily use 01/14/2015  . Acute respiratory failure ([place]) 01/14/2015  . Acute hyponatremia 01/14/2015  . Acute right heart failure ([place]) 01/14/2015  . Congestive heart disease ([place])   . Right heart failure ([place]) 01/03/2015  .  Cardiomegaly 01/03/2015  . Atrial fibrillation, controlled ([place]) 01/03/2015  . Moderate aortic stenosis 01/03/2015  . Chronic anticoagulation 01/03/2015  . Hypertension 01/03/2015  . Dyspnea 01/03/2015  . A-fib ([place]) 02/17/2014  . Long term current use of anticoagulant 02/17/2014  . Essential (primary) hypertension 11/01/2013  . Adult BMI 30+ 10/21/2013  . Adiposity 04/22/2013  . H/O cardiovascular disorder 04/22/2013  . Headache, tension-type 02/05/2013    SCHINKE,CARL ,MS, CCC-SLP  12/15/2016, 2:39 PM  [facility] [address] [place], [place], [address] Phone: [phone]   Fax:  [phone]   Name: FENIX RORKE MRN: [id number] Date of Birth: 1951/06/02

## 2016-12-18 ENCOUNTER — Encounter: Payer: Self-pay | Admitting: Radiation Oncology

## 2016-12-18 NOTE — Progress Notes (Signed)
Kevin Martin presents for follow up of radiation completed 12/11/16 to his Laryngopharynx and Bilateral Neck.    Pain issues, if any: He reports pain when swallowing. He is taking one hydrocodone about every 3-6 hours with relief.  Using a feeding tube?: No Weight changes, if any:  Wt Readings from Last 3 Encounters:  12/22/16 213 lb (96.6 kg)  12/10/16 218 lb 12.8 oz (99.2 kg)  12/01/16 220 lb (99.8 kg)   Swallowing issues, if any: He is drinking 7 Boost daily. He is not eating solid foods due to taste changes. He states he has tried solid foods but cannot stand the taste and spits it back out.  Smoking or chewing tobacco? He denies Using fluoride trays daily? N/A Last ENT visit was on: Not since diagnosis Other notable issues, if any:  He reports a dry mouth/ throat.   BP (!) 138/58   Pulse 63   Temp 97.9 F (36.6 C)   Ht '6\' 1"'$  (1.854 m)   Wt 213 lb (96.6 kg)   SpO2 100% Comment: room air  BMI 28.10 kg/m

## 2016-12-22 ENCOUNTER — Ambulatory Visit
Admission: RE | Admit: 2016-12-22 | Discharge: 2016-12-22 | Disposition: A | Discharge status: Home / Self Care | Payer: Medicare Other | Source: Ambulatory Visit | Attending: Radiation Oncology | Admitting: Radiation Oncology

## 2016-12-22 ENCOUNTER — Encounter: Payer: Self-pay | Admitting: Radiation Oncology

## 2016-12-22 VITALS — BP 138/58 | HR 63 | Temp 97.9°F | Ht 73.0 in | Wt 213.0 lb

## 2016-12-22 DIAGNOSIS — R1310 Dysphagia, unspecified: Secondary | ICD-10-CM | POA: Diagnosis not present

## 2016-12-22 DIAGNOSIS — R634 Abnormal weight loss: Secondary | ICD-10-CM

## 2016-12-22 DIAGNOSIS — C321 Malignant neoplasm of supraglottis: Secondary | ICD-10-CM

## 2016-12-22 HISTORY — DX: Personal history of irradiation: Z92.3

## 2016-12-22 MED ORDER — HYDROCODONE-ACETAMINOPHEN 5-325 MG PO TABS: 1.0000 | ORAL_TABLET | ORAL | 0 refills | Status: DC | PRN

## 2016-12-22 NOTE — Progress Notes (Signed)
   Weekly Management Note:  Outpatient    Cancer of supraglottis (HCC) 161.1 C32.1   Current Dose:  46 Gy  Projected Dose: 70 Gy   Narrative:  The patient presents for routine under treatment assessment.  CBCT/MVCT images/Port film x-rays were reviewed.  The chart was checked.  He is tolerating radiotherapy  Physical Findings:  NAD, ambulatory, mucositis in treatment fields, expected skin irritation over neck  CBC    Component Value Date/Time   WBC 9.0 09/15/2016 0819   RBC 4.84 09/15/2016 0819   HGB 14.8 09/15/2016 0819   HCT 43.4 09/15/2016 0819   PLT 183 09/15/2016 0819   MCV 89.7 09/15/2016 0819   MCH 30.6 09/15/2016 0819   MCHC 34.1 09/15/2016 0819   RDW 13.4 09/15/2016 0819   LYMPHSABS 1.1 01/14/2015 1239   MONOABS 0.7 01/14/2015 1239   EOSABS 0.0 01/14/2015 1239   BASOSABS 0.0 01/14/2015 1239     CMP     Component Value Date/Time   NA 136 11/18/2016 0853   K 4.4 11/18/2016 0853   CL 103 09/15/2016 0819   CO2 28 11/18/2016 0853   GLUCOSE 122 11/18/2016 0853   BUN 15.4 11/18/2016 0853   CREATININE 0.9 11/18/2016 0853   CALCIUM 9.6 11/18/2016 0853   PROT 6.6 09/15/2016 0819   ALBUMIN 3.6 09/15/2016 0819   AST 21 09/15/2016 0819   ALT 19 09/15/2016 0819   ALKPHOS 47 09/15/2016 0819   BILITOT 1.1 09/15/2016 0819   GFRNONAA >60 09/15/2016 0819   GFRAA >60 09/15/2016 0819     Impression:  The patient is tolerating radiotherapy.   Plan:  Continue radiotherapy as planned.  f/u with Dory Peru of nutrition  -----------------------------------  Eppie Gibson, MD

## 2016-12-22 NOTE — Progress Notes (Signed)
Radiation Oncology         [phone] ________________________________  Name: Kevin Martin MRN: [id number]  Date: 12/22/2016  DOB: April 28, 1951  Follow-Up Visit Note  CC: Berkley Harvey, NP  Izora Gala, MD  Diagnosis and Prior Radiotherapy:       ICD-9-CM ICD-10-CM   1. Loss of weight 783.21 R63.4 TSH  2. 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  Less pain. Doing well. Eating little due to taste, but drinking relatively  well.                      7 boost daily, plus water.  Dry mouth/throat.   Pleased with his recovery so far.  ALLERGIES:  has No Known Allergies.  Meds: Current Outpatient Prescriptions  Medication Sig Dispense Refill  . amLODipine (NORVASC) 10 MG tablet Take 1 tablet (10 mg total) by mouth daily. 30 tablet 2  . emollient (BIAFINE) cream Apply 1 application topically as needed.    . folic acid (FOLVITE) 329 MCG tablet Take 400 mcg by mouth daily.    . furosemide (LASIX) 40 MG tablet Take 1 tablet (40 mg total) by mouth daily. 30 tablet 2  . lisinopril (PRINIVIL,ZESTRIL) 40 MG tablet Take 1 tablet (40 mg total) by mouth daily. 30 tablet 2  . loratadine (CLARITIN) 10 MG tablet Take 10 mg by mouth.    . pravastatin (PRAVACHOL) 80 MG tablet Take 1 tablet (80 mg total) by mouth daily. (Patient taking differently: Take 80 mg by mouth every evening. ) 30 tablet 2  . ranitidine (ZANTAC) 150 MG tablet Take 150 mg by mouth.    . rivaroxaban (XARELTO) 20 MG TABS tablet Take 1 tablet (20 mg total)  by mouth daily with supper. 30 tablet 3  . thiamine 100 MG tablet Take 1 tablet (100 mg total) by mouth daily. 30 tablet 0  . acetaminophen (TYLENOL) 500 MG tablet Take 1,000 mg by mouth every 6 (six) hours as needed for mild pain.    Marland Kitchen HYDROcodone-acetaminophen (NORCO/VICODIN) 5-325 MG tablet Take 1 tablet by mouth every 4 (four) hours as needed for moderate pain. Taper over the next 3 weeks. 60 tablet 0  . lidocaine (XYLOCAINE) 2 % solution Patient: Mix 1part 2% viscous lidocaine, 1part H20. Swish and swallow 29m of this mixture, 342m before meals and at bedtime, up to QID (Patient not taking: Reported on 12/22/2016) 100 mL 5  . Multiple Vitamin (MULTIVITAMIN WITH MINERALS) TABS tablet Take 1 tablet by mouth daily. 50+ for Men    . omega-3 acid ethyl esters (LOVAZA) 1 G capsule Take 3 g by mouth daily.     . sucralfate (CARAFATE) 1 g tablet Dissolve 1 tablet in 10 mL H20 and swallow up to QID for sore throat. (Patient not taking: Reported on 12/22/2016) 40 tablet 5   No current facility-administered medications for this encounter.     Physical Findings: The patient is in no acute distress.  Patient is alert and oriented. Wt Readings from Last 3 Encounters:  12/22/16 213 lb (96.6 kg)  12/10/16 218 lb 12.8 oz (99.2 kg)  12/01/16 220 lb (99.8 kg)    height is '6\' 1"'$  (1.854 m) and weight is 213 lb (96.6 kg). His temperature is 97.9 F (36.6 C). His blood pressure is 138/58 (abnormal) and his pulse is 63. His oxygen saturation is 100%. .  General: Alert and oriented, in no acute distress HEENT: Head is normocephalic. Extraocular movements are intact. Oropharynx is notable for dry mucosa, no thrush Neck: Neck is notable for dry skin, no masses Skin: Skin in treatment fields shows satisfactory healing so far, but dry.  Intact skin Lymphatics: see Neck Exam Psychiatric: Judgment and insight are intact. Affect is appropriate.   Lab Findings: Lab Results  Component Value Date   WBC 9.0  09/15/2016   HGB 14.8 09/15/2016   HCT 43.4 09/15/2016   MCV 89.7 09/15/2016   PLT 183 09/15/2016    Lab Results  Component Value Date   TSH 1.421 10/14/2016    Radiographic Findings: No results found.  Impression/Plan:    1) Head and Neck Cancer Status: healing well from RT  2) Nutritional Status: losing some weight - increase po intake, more boost, then more food as taste improves. PEG tube: none  3) Risk Factors: The patient has been educated about risk factors including alcohol and tobacco abuse; they understand that avoidance of alcohol and tobacco is important to prevent recurrences as well as other cancers  4) Swallowing: functional  5) Dental: no teeth  6) Thyroid function: recheck in 3 mo Lab Results  Component Value Date   TSH 1.421 10/14/2016    7) Other: refer back to [facility] for survivorship Apply vit E lotion to skin over neck. Decreased pain meds to lower dose of hydrocodone - continue to taper as pain improves.  8) Follow-up in 3 months. The patient was encouraged to call with any issues or questions before then. PET and TSH at that time.     Eppie Gibson, MD

## 2017-01-13 ENCOUNTER — Ambulatory Visit: Payer: Medicare Other | Attending: Radiation Oncology | Admitting: Physical Therapy

## 2017-01-13 ENCOUNTER — Encounter: Payer: Self-pay | Admitting: Physical Therapy

## 2017-01-13 DIAGNOSIS — M6281 Muscle weakness (generalized): Secondary | ICD-10-CM | POA: Insufficient documentation

## 2017-01-13 DIAGNOSIS — R293 Abnormal posture: Secondary | ICD-10-CM | POA: Insufficient documentation

## 2017-01-13 DIAGNOSIS — R262 Difficulty in walking, not elsewhere classified: Secondary | ICD-10-CM | POA: Insufficient documentation

## 2017-01-13 NOTE — Patient Instructions (Signed)
Bridging    Slowly raise buttocks from floor, keeping stomach tight. Repeat _10___ times per set. Do __1__ sets per session. Do __2__ sessions per day.  http://orth.exer.us/1096   Copyright  VHI. All rights reserved.  ABDUCTION: Sitting - Resistance Band (Active)    Sit with feet flat. Lift right leg slightly and, against green resistance band, draw it out to side. Complete _1__ sets of _10__ repetitions. Perform _2__ sessions per day.  Copyright  VHI. All rights reserved.  Knee Raise    Lift knee and then lower it. Repeat with other knee. Use ankle weights - 3-5 lbs Repeat _10___ times. Do __2__ sessions per day.  http://gt2.exer.us/445   Copyright  VHI. All rights reserved.

## 2017-01-13 NOTE — Therapy (Signed)
[place], [place], [address] Phone: [phone]   Fax:  [phone]  Physical Therapy Treatment  Patient Details  Name: Kevin Martin MRN: [id number] Date of Birth: [birth year] Referring Provider: Dr. Eppie Gibson  Encounter Date: 01/13/2017      PT End of Session - 01/13/17 1154    Visit Number 2   Number of Visits 5   Date for PT Re-Evaluation 02/10/17   PT Start Time 1101   PT Stop Time 1146   PT Time Calculation (min) 45 min   Activity Tolerance Patient tolerated treatment well   Behavior During Therapy [facility] for tasks assessed/performed      Past Medical History:  Diagnosis Date  . Aortic stenosis    moderate by 12/16/15 echo  . Atrial fibrillation ([place])   . Cardiomegaly   . CHF (congestive heart failure) ([place])   . History of radiation therapy 10/23/16- 12/11/16   Laryngopharynx and bilateral neck, 70 Gy in 35 fractions.   . Hypertension   . Shortness of breath dyspnea     Past Surgical History:  Procedure Laterality Date  . COLONOSCOPY    . DIRECT LARYNGOSCOPY N/A 09/19/2016   Procedure: DIRECT LARYNGOSCOPY with BIOPSY;  Surgeon: Izora Gala, MD;  Location: [place];  Service: ENT;  Laterality: N/A;  . ESOPHAGOSCOPY N/A 09/19/2016   Procedure: ESOPHAGOSCOPY;  Surgeon: Izora Gala, MD;  Location: [place];  Service: ENT;  Laterality: N/A;  . FRACTURE SURGERY Right    right arm  . NECK SURGERY      There were no vitals filed for this visit.      Subjective Assessment - 01/13/17 1114    Subjective I still don't have much energy. I can not do a whole lot of walking. It is getting better. When I get my appetite back and can eat I think I will get my energy back. I drink 7-8 boosts/day. I do a lot of walking in the house and outside.    Patient is accompained by: Family member   Pertinent History Pt. with supraglottic invasive squamous cell carcinoma diagnosed 09/19/16.  Plans to have XRT only; no  chemo due to h/o cardiac problems.  Will start XRT 10/23/16.  CHF, atrial fibrillation, cardiomegaly, HTN.   Patient Stated Goals get info from all head & neck clinic providers   Currently in Pain? No/denies   Pain Score 0-No pain            [facility] PT Assessment - 01/13/17 0001      Functional Tests   Functional tests Sit to Stand     Sit to Stand   Comments 11 times in 30 seconds, feels fatigue in quadricep muscle     Posture/Postural Control   Posture/Postural Control Postural limitations   Postural Limitations Forward head     ROM / Strength   AROM / PROM / Strength Strength     Strength   Overall Strength Within functional limits for tasks performed  UE is 5/5, LE 4/5           LYMPHEDEMA/ONCOLOGY QUESTIONNAIRE - 01/13/17 1117      Head and Neck   4 cm superior to sternal notch around neck 45 cm   6 cm superior to sternal notch around neck 45 cm   8 cm superior to sternal notch around neck 45.8 cm                  [facility]  Adult PT Treatment/Exercise - 01/13/17 0001      Knee/Hip Exercises: Seated   Long Arc Quad Strengthening;Both;1 set;10 reps;Weights   Long Arc Quad Weight 3 lbs.   Marching Strengthening;Both;1 set;10 reps;Weights   Marching Weights 3 lbs.   Hamstring Curl Strengthening;Both;1 set;10 reps  green theraband   Abduction/Adduction  Strengthening;Both;1 set;10 reps  green theraband     Knee/Hip Exercises: Supine   Bridges Strengthening;Both;1 set;10 reps                PT Education - 01/13/17 1152    Education provided Yes   Education Details walking for endurance and deconditioning, LE strengthening exercises   Person(s) Educated Patient   Methods Explanation;Handout   Comprehension Verbalized understanding                [facility] Goals - 01/13/17 1152      CC Long Term Goal  #1   Title Patient will be independent in a home exercise program for continued strengthening and stretching   Time 4    Period Weeks   Status New     CC Long Term Goal  #2   Title Pt will demonstrate 5/5 strength in bilateral LEs to allow increased endurance while walking   Baseline 4/5   Time 4   Period Weeks   Status New     CC Long Term Goal  #3   Title Pt to report a 30% decrease in overall fatigue when walking to allow pt to return to prior level of function   Time 4   Period Weeks   Status New         [facility] Goals - 10/14/16 1049      Patient will be able to verbalize understanding of a home exercise program for cervical range of motion, posture, and walking.    Status Achieved     Patient will be able to verbalize understanding of proper sitting and standing posture.    Status Achieved     Patient will be able to verbalize understanding of lymphedema risk and availability of treatment for this condition.    Status Achieved           Plan - 01/13/17 1207    Clinical Impression Statement Re eval performed today. Pt originally stated he did not think he needed additional therapy. He was sent here for a post treatment visit from Dr Isidore Moos. He is not having any swelling. HIs circumferential measurements have decreased since last session. This most likely due to his recent weight loss - 5lbs in 2 weeks. Pt demonstrates weakness in LEs 4/5. He overall demonstrates fatigue and deconditioning. He states he can not walk like he used to and tires quickly. Pt would benefit from skilled PT services to increase LE strength, decrease fatigue and decrease deconditioning.    Rehab Potential Good   Clinical Impairments Affecting Rehab Potential hx of radiation   PT Frequency 1x / week   PT Duration 4 weeks   PT Treatment/Interventions ADLs/Self Care Home Management;Therapeutic exercise;Therapeutic activities;Patient/family education;Passive range of motion;Manual techniques;Gait training   PT Next Visit Plan standing LE exercises, NuStep, SciFit?, advance HEP   PT Home  Exercise Plan seated hip flexion, knee extension, hamstring curls, bridging, sit to stands, hip abduction   Consulted and Agree with Plan of Care Patient;Family member/caregiver      Patient will benefit from skilled therapeutic intervention in order to improve the following deficits and impairments:  Postural dysfunction, Decreased range of motion, Decreased strength, Decreased endurance, Decreased activity tolerance, Difficulty walking  Visit Diagnosis: Muscle weakness (generalized) - Plan: PT plan of care cert/re-cert  Difficulty in walking, not elsewhere classified - Plan: PT plan of care cert/re-cert  Abnormal posture - Plan: PT plan of care cert/re-cert       G-Codes - 02/03/17 12/02/10    Functional Assessment Tool Used (Outpatient Only) clinical judgement   Functional Limitation Changing and maintaining body position   Changing and Maintaining Body Position Current Status (K7425) At least 20 percent but less than 40 percent impaired, limited or restricted   Changing and Maintaining Body Position Goal Status (Z5638) At least 1 percent but less than 20 percent impaired, limited or restricted      Problem List Patient Active Problem List   Diagnosis Date Noted  . Multiple lung nodules on CT 10/10/2016  . Alcoholism ([place]) 10/10/2016  . 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 Chronic atrial fibrillation ([place]) 02/14/2015  . Chronic diastolic CHF (congestive heart failure) ([place]) 02/14/2015  . Chronic diastolic heart failure ([place]) 02/14/2015  . Prediabetes 01/23/2015  . Tobacco abuse 01/23/2015  . Acute on chronic congestive heart failure ([place]) 01/15/2015  . Acute congestive heart failure with left ventricular diastolic dysfunction ([place]) 02/04/2015  . Alcohol abuse, daily use 02-04-15  . Acute respiratory failure ([place]) 04-Feb-2015  . Acute hyponatremia 02/04/2015  . Acute right heart failure ([place]) 02/04/2015  . Congestive heart disease ([place])   . Right heart failure ([place])  01/03/2015  . Cardiomegaly 01/03/2015  . Atrial fibrillation, controlled ([place]) 01/03/2015  . Moderate aortic stenosis 01/03/2015  . Chronic anticoagulation 01/03/2015  . Hypertension 01/03/2015  . Dyspnea 01/03/2015  . A-fib ([place]) 02/17/2014  . Long term current use of anticoagulant 02/17/2014  . Essential (primary) hypertension 11/01/2013  . Adult BMI 30+ 10/21/2013  . Adiposity 04/22/2013  . H/O cardiovascular disorder 04/22/2013  . Headache, tension-type 02/05/2013    Allyson Sabal [facility] Feb 03, 2017, 12:17 PM  [place], [place], [address] Phone: [phone]   Fax:  [phone]  Name: DEVLON DOSHER MRN: [id number] Date of Birth: 07/31/1951  Manus Gunning, PT 02/03/17 12:17 PM

## 2017-01-19 ENCOUNTER — Ambulatory Visit: Payer: Medicare Other | Admitting: Physical Therapy

## 2017-01-19 VITALS — HR 78

## 2017-01-19 DIAGNOSIS — R262 Difficulty in walking, not elsewhere classified: Secondary | ICD-10-CM

## 2017-01-19 DIAGNOSIS — M6281 Muscle weakness (generalized): Secondary | ICD-10-CM

## 2017-01-19 NOTE — Patient Instructions (Signed)
Heel Raise: Bilateral (Standing)    Okay to have 5 lb. weights on ankles.  Rise on balls of feet. Repeat __20__ times per set. Do __2__ sets per session. Do __1__ sessions per day.  http://orth.exer.us/38   Copyright  VHI. All rights reserved.  Hamstring Strengthening    Holding support, lift right heel toward buttocks. Use __5__ lbs on ankle. Hold __1__ seconds. Repeat _10___ times. Do 2 sets, __1__ sessions per day.  http://gt2.exer.us/281   Copyright  VHI. All rights reserved.  Back Leg Kick    With 5 lb. weight on each ankle, swing leg back as far as possible. Return to center. Do 10 repetitions. Repeat with other leg. Do 2 sets,  __1__ sessions per day.  http://gt2.exer.us/483   Copyright  VHI. All rights reserved.  ABDUCTION: Standing (Active)    Stand, feet flat. Lift right leg out to side. Use _5__ lbs. Complete __2_ sets of __10_ repetitions. Perform _1__ sessions per day.  http://gtsc.exer.us/110   Copyright  VHI. All rights reserved.

## 2017-01-19 NOTE — Therapy (Signed)
[place], [place], [address] Phone: [phone]   Fax:  [phone]  Physical Therapy Treatment  Patient Details  Name: Kevin Martin MRN: [id number] Date of Birth: 07/08/1951 Referring Provider: Dr. Eppie Gibson  Encounter Date: 01/19/2017      PT End of Session - 01/19/17 1313    Visit Number 3   Number of Visits 5   Date for PT Re-Evaluation 02/10/17   PT Start Time 0850   PT Stop Time 0931   PT Time Calculation (min) 41 min   Activity Tolerance Patient tolerated treatment well   Behavior During Therapy [facility] for tasks assessed/performed      Past Medical History:  Diagnosis Date  . Aortic stenosis    moderate by 12/16/15 echo  . Atrial fibrillation ([place])   . Cardiomegaly   . CHF (congestive heart failure) ([place])   . History of radiation therapy 10/23/16- 12/11/16   Laryngopharynx and bilateral neck, 70 Gy in 35 fractions.   . Hypertension   . Shortness of breath dyspnea     Past Surgical History:  Procedure Laterality Date  . COLONOSCOPY    . DIRECT LARYNGOSCOPY N/A 09/19/2016   Procedure: DIRECT LARYNGOSCOPY with BIOPSY;  Surgeon: Izora Gala, MD;  Location: [place];  Service: ENT;  Laterality: N/A;  . ESOPHAGOSCOPY N/A 09/19/2016   Procedure: ESOPHAGOSCOPY;  Surgeon: Izora Gala, MD;  Location: [place];  Service: ENT;  Laterality: N/A;  . FRACTURE SURGERY Right    right arm  . NECK SURGERY      Vitals:   01/19/17 0921  Pulse: 78  SpO2: 98%        Subjective Assessment - 01/19/17 0851    Subjective Nothing new today.  "I'm sore.  I played golf yesterday; had to walk half the course because it was wet. Almost didn't make it."  I was exhausted when I got home.  "I've got some 5 lb. weights at home (strap on) and I've been putting those on and sitting and raising my legs up."  Describes seated hip flexion with 5 lb. weights, and long arc quads; also does bridging, and stand up/sit down 10  times.   Currently in Pain? No/denies                         [facility] Adult PT Treatment/Exercise - 01/19/17 0001      Knee/Hip Exercises: Aerobic   Recumbent Bike L2 x 3 mins.; moderate dyspnea afterward  HR 98 bpm briefly (came down quickly); O2 sat 98%     Knee/Hip Exercises: Standing   Heel Raises Both;20 reps;1 second;2 sets  5 lbs. on each ankle   Knee Flexion Strengthening;Right;Left;10 reps;2 sets  5 lbs.   Hip Abduction Stengthening;Right;Left;10 reps;Knee straight;2 sets  5 lbs.   Hip Extension Stengthening;Right;Left;10 reps;Knee straight;2 sets  5 lbs.   Lateral Step Up Right;Left;10 reps;Hand Hold: 1;Step Height: 8";2 sets  moderate dyspnea following 1 set   Step Down Right;Left;10 reps;Hand Hold: 1;Step Height: 6"  then did the same at 8 inch step   Step Down Limitations Difficulty with 8 inch step:  challenging and he became moderately short of breath with this                PT Education - 01/19/17 1313    Education provided Yes   Education Details standing hip abduction and extension exercise, knee flexion and heel raises  Person(s) Educated Patient   Methods Explanation;Verbal cues;Handout   Comprehension Verbalized understanding;Returned demonstration                [facility] Goals - 01/19/17 1316      CC Long Term Goal  #1   Title Patient will be independent in a home exercise program for continued strengthening and stretching   Status On-going         [facility] Goals - 10/14/16 1049      Patient will be able to verbalize understanding of a home exercise program for cervical range of motion, posture, and walking.    Status Achieved     Patient will be able to verbalize understanding of proper sitting and standing posture.    Status Achieved     Patient will be able to verbalize understanding of lymphedema risk and availability of treatment for this condition.    Status  Achieved           Plan - 01/19/17 1313    Clinical Impression Statement Patient was very willing to exercise and did fairly well with this today, except that he did have moderate dyspnea with many exercises that necessitated him to rest for a few minutes before doing the next exercise.  HIs O2 saturation stayed in the high 90s, however, and heart rate increased appropriately with exercise.  He was given additional HEP instruction to add onto what he described he is already doing at home.   Rehab Potential Good   Clinical Impairments Affecting Rehab Potential hx of radiation   PT Frequency 1x / week   PT Duration 4 weeks   PT Treatment/Interventions ADLs/Self Care Home Management;Therapeutic exercise;Therapeutic activities;Patient/family education;Passive range of motion;Manual techniques;Gait training   PT Next Visit Plan standing LE exercises--consider more step-ups and downs, but also try lunges, etc.--, NuStep, SciFit?, advance HEP   Consulted and Agree with Plan of Care Patient      Patient will benefit from skilled therapeutic intervention in order to improve the following deficits and impairments:  Postural dysfunction, Decreased range of motion, Decreased strength, Decreased endurance, Decreased activity tolerance, Difficulty walking  Visit Diagnosis: Muscle weakness (generalized)  Difficulty in walking, not elsewhere classified     Problem List Patient Active Problem List   Diagnosis Date Noted  . Multiple lung nodules on CT 10/10/2016  . Alcoholism ([place]) 10/10/2016  . 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 Chronic atrial fibrillation ([place]) 02/14/2015  . Chronic diastolic CHF (congestive heart failure) ([place]) 02/14/2015  . Chronic diastolic heart failure ([place]) 02/14/2015  . Prediabetes 01/23/2015  . Tobacco abuse 01/23/2015  . Acute on chronic congestive heart failure ([place]) 01/15/2015  . Acute congestive heart failure with left ventricular diastolic dysfunction  ([place]) 01/14/2015  . Alcohol abuse, daily use 01/14/2015  . Acute respiratory failure ([place]) 01/14/2015  . Acute hyponatremia 01/14/2015  . Acute right heart failure ([place]) 01/14/2015  . Congestive heart disease ([place])   . Right heart failure ([place]) 01/03/2015  . Cardiomegaly 01/03/2015  . Atrial fibrillation, controlled ([place]) 01/03/2015  . Moderate aortic stenosis 01/03/2015  . Chronic anticoagulation 01/03/2015  . Hypertension 01/03/2015  . Dyspnea 01/03/2015  . A-fib ([place]) 02/17/2014  . Long term current use of anticoagulant 02/17/2014  . Essential (primary) hypertension 11/01/2013  . Adult BMI 30+ 10/21/2013  . Adiposity 04/22/2013  . H/O cardiovascular disorder 04/22/2013  . Headache, tension-type 02/05/2013    SALISBURY,DONNA 01/19/2017, 1:17 PM  [facility]  [place], [place], [address] Phone: [phone]   Fax:  [phone]  Name: YOHANNES WAIBEL MRN: [id number] Date of Birth: 03/19/1951  Serafina Royals, PT 01/19/17 1:18 PM

## 2017-01-23 ENCOUNTER — Other Ambulatory Visit: Payer: Self-pay | Admitting: Nurse Practitioner

## 2017-01-23 DIAGNOSIS — R918 Other nonspecific abnormal finding of lung field: Secondary | ICD-10-CM

## 2017-01-28 ENCOUNTER — Ambulatory Visit: Payer: Medicare Other | Admitting: Physical Therapy

## 2017-01-28 VITALS — HR 63

## 2017-01-28 DIAGNOSIS — R262 Difficulty in walking, not elsewhere classified: Secondary | ICD-10-CM

## 2017-01-28 DIAGNOSIS — M6281 Muscle weakness (generalized): Secondary | ICD-10-CM

## 2017-01-28 NOTE — Therapy (Signed)
[place], [place], [address] Phone: [phone]   Fax:  [phone]  Physical Therapy Treatment  Patient Details  Name: Kevin Martin MRN: [id number] Date of Birth: [birth year] Referring Provider: Dr. Eppie Gibson  Encounter Date: 01/28/2017      PT End of Session - 01/28/17 1240    Visit Number 4   Number of Visits 5   Date for PT Re-Evaluation 02/10/17   PT Start Time 0847   PT Stop Time 0930   PT Time Calculation (min) 43 min   Activity Tolerance Patient tolerated treatment well   Behavior During Therapy [facility] for tasks assessed/performed      Past Medical History:  Diagnosis Date  . Aortic stenosis    moderate by 12/16/15 echo  . Atrial fibrillation ([place])   . Cardiomegaly   . CHF (congestive heart failure) ([place])   . History of radiation therapy 10/23/16- 12/11/16   Laryngopharynx and bilateral neck, 70 Gy in 35 fractions.   . Hypertension   . Shortness of breath dyspnea     Past Surgical History:  Procedure Laterality Date  . COLONOSCOPY    . DIRECT LARYNGOSCOPY N/A 09/19/2016   Procedure: DIRECT LARYNGOSCOPY with BIOPSY;  Surgeon: Izora Gala, MD;  Location: [place];  Service: ENT;  Laterality: N/A;  . ESOPHAGOSCOPY N/A 09/19/2016   Procedure: ESOPHAGOSCOPY;  Surgeon: Izora Gala, MD;  Location: [place];  Service: ENT;  Laterality: N/A;  . FRACTURE SURGERY Right    right arm  . NECK SURGERY      Vitals:   01/28/17 0849  Pulse: 63  SpO2: 97%        Subjective Assessment - 01/28/17 0849    Subjective "I'm good except for my hips.  I've got to go to the doctor on Friday about it.  It kept me awake."  Will see his primary care doctor.  Has been doing exercises every morning--not making hips any better, but not worse.  "I still don't have no energy.  I can sit in the chair and just fall asleep."  "I get up every hour at night--for a drink of water or something.  I've been doing it for years.   I get right back to sleep again." Played golf Sunday and wasn't as "give out" at the end as he had been in the past.   Currently in Pain? Yes   Pain Score 3   was worse than this   Pain Location Hip   Pain Orientation Right;Left   Pain Descriptors / Indicators --  "just a pain"   Aggravating Factors  nothing   Pain Relieving Factors nothing                         [facility] Adult PT Treatment/Exercise - 01/28/17 0001      Knee/Hip Exercises: Aerobic   Recumbent Bike L1 x 7 mins  SpO2 97, HR 71 bpm following; mild to no dyspnea   Nustep L5 x 7 mins with arms and legs  SpO2 97, HR 65 afterward   Other Aerobic Encouraged patient to try to walk at home, 5-6 days a week, and increasing the length of time gradually that he can walk continuously.     Knee/Hip Exercises: Standing   Side Lunges Right;Left  about 10 reps to right, 5 to left, then fatigued and SOB   Lateral Step Up Right;Left;10 reps;Hand Hold: 1;Step Height: 8";2 sets  moderate dyspnea after 2 sets of 10 on both sides   Lateral Step Up Limitations legs felt tired after   Step Down Right;Left;1 set;10 reps;Hand Hold: 1;Step Height: 8"  mod. dyspnea; spO2 98, HR 70 bpm   Step Down Limitations Difficulty with right knee controlling descent, became uncomfortable, so 9 reps only  needed a few minutes for breathing rate to return to normal                PT Education - 01/28/17 1240    Education provided Yes   Education Details continue to work on all of HEP and also on increasing walking time, as well as trying to walk most days of the week   Person(s) Educated Patient   Methods Explanation   Comprehension Verbalized understanding                [place] - 01/28/17 1244      CC Long Term Goal  #1   Title Patient will be independent in a home exercise program for continued strengthening and stretching   Status On-going     CC Long Term Goal  #2   Title Pt will demonstrate  5/5 strength in bilateral LEs to allow increased endurance while walking   Status On-going     CC Long Term Goal  #3   Title Pt to report a 30% decrease in overall fatigue when walking to allow pt to return to prior level of function   Status On-going         [facility] Goals - 10/14/16 1049      Patient will be able to verbalize understanding of a home exercise program for cervical range of motion, posture, and walking.    Status Achieved     Patient will be able to verbalize understanding of proper sitting and standing posture.    Status Achieved     Patient will be able to verbalize understanding of lymphedema risk and availability of treatment for this condition.    Status Achieved           Plan - 01/28/17 1241    Clinical Impression Statement Patient was challenged by his workout today. He did well with pedaling the recumbent bike and then the Nustep x 7 minutes each; got more fatigued with LE strengthening exercises using steps.  He felt that he had gotten a good leg workout today.  He continues to show a Trendelenberg gait.   Rehab Potential Good   Clinical Impairments Affecting Rehab Potential hx of radiation   PT Frequency 1x / week   PT Duration 4 weeks   PT Treatment/Interventions ADLs/Self Care Home Management;Therapeutic exercise;Therapeutic activities;Patient/family education;Passive range of motion;Manual techniques;Gait training   PT Next Visit Plan Do LE manual  muscle test and check goals; then standing LE exercises including more step-ups and downs, lunges, etc.--, NuStep, SciFit?, advance HEP   PT Home Exercise Plan seated hip flexion, knee extension, hamstring curls, bridging, sit to stands, hip abduction; also progressive walking times   Consulted and Agree with Plan of Care Patient      Patient will benefit from skilled therapeutic intervention in order to improve the following deficits and impairments:  Postural dysfunction,  Decreased range of motion, Decreased strength, Decreased endurance, Decreased activity tolerance, Difficulty walking  Visit Diagnosis: Muscle weakness (generalized)  Difficulty in walking, not elsewhere classified     Problem List Patient Active Problem List   Diagnosis Date Noted  .  Multiple lung nodules on CT 10/10/2016  . Alcoholism ([place]) 10/10/2016  . 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 Chronic atrial fibrillation ([place]) 02/14/2015  . Chronic diastolic CHF (congestive heart failure) ([place]) 02/14/2015  . Chronic diastolic heart failure ([place]) 02/14/2015  . Prediabetes 01/23/2015  . Tobacco abuse 01/23/2015  . Acute on chronic congestive heart failure ([employer]) 01/15/2015  . Acute congestive heart failure with left ventricular diastolic dysfunction ([place]) 01/14/2015  . Alcohol abuse, daily use 01/14/2015  . Acute respiratory failure ([place]) 01/14/2015  . Acute hyponatremia 01/14/2015  . Acute right heart failure ([place]) 01/14/2015  . Congestive heart disease ([place])   . Right heart failure ([place]) 01/03/2015  . Cardiomegaly 01/03/2015  . Atrial fibrillation, controlled ([place]) 01/03/2015  . Moderate aortic stenosis 01/03/2015  . Chronic anticoagulation 01/03/2015  . Hypertension 01/03/2015  . Dyspnea 01/03/2015  . A-fib ([place]) 02/17/2014  . Long term current use of anticoagulant 02/17/2014  . Essential (primary) hypertension 11/01/2013  . Adult BMI 30+ 10/21/2013  . Adiposity 04/22/2013  . H/O cardiovascular disorder 04/22/2013  . Headache, tension-type 02/05/2013    Dayrin Stallone 01/28/2017, 12:45 PM  [place], [place], [address] Phone: [phone]   Fax:  [phone]  Name: JEOVANNY CUADROS MRN: [id number] Date of Birth: 08-30-1951  Serafina Royals, PT 01/28/17 12:45 PM

## 2017-01-30 ENCOUNTER — Ambulatory Visit
Admission: RE | Admit: 2017-01-30 | Discharge: 2017-01-30 | Disposition: A | Discharge status: Home / Self Care | Payer: Medicare Other | Source: Ambulatory Visit | Attending: Nurse Practitioner | Admitting: Nurse Practitioner

## 2017-01-30 ENCOUNTER — Encounter: Payer: Medicare Other | Admitting: Physical Therapy

## 2017-01-30 DIAGNOSIS — R918 Other nonspecific abnormal finding of lung field: Secondary | ICD-10-CM

## 2017-01-31 NOTE — Addendum Note (Signed)
Addendum  created 01/31/17 4656 by Duane Boston, MD   Sign clinical note

## 2017-02-02 ENCOUNTER — Encounter: Payer: Self-pay | Admitting: Radiation Oncology

## 2017-02-02 ENCOUNTER — Ambulatory Visit: Payer: Medicare Other | Admitting: Physical Therapy

## 2017-02-02 ENCOUNTER — Telehealth: Payer: Self-pay | Admitting: Hematology and Oncology

## 2017-02-02 NOTE — Telephone Encounter (Signed)
Confirmed 6/13 appt at 1230 per sch msg

## 2017-02-02 NOTE — Progress Notes (Addendum)
I spoke with the patient's significant other today about his chest CT results - chest CT was ordered for wt loss by PCP.  I also tried calling Kevin Martin [phone]) but got his voicemail, so I left a message asking him to call me back today to discuss the results. Will refer to Medical Oncology and place him on tumor board - high concern for lung metastases.  Addendum: Kevin Martin returned my call and took the news relatively well. He looks forward to meeting with medical oncology, Dr. Alvy Bimler.  -----------------------------------  Eppie Gibson, MD

## 2017-02-09 ENCOUNTER — Ambulatory Visit: Payer: Medicare Other | Attending: Radiation Oncology | Admitting: Physical Therapy

## 2017-02-11 ENCOUNTER — Ambulatory Visit (HOSPITAL_BASED_OUTPATIENT_CLINIC_OR_DEPARTMENT_OTHER): Payer: Medicare Other | Admitting: Hematology and Oncology

## 2017-02-11 ENCOUNTER — Encounter: Payer: Self-pay | Admitting: *Deleted

## 2017-02-11 DIAGNOSIS — I5032 Chronic diastolic (congestive) heart failure: Secondary | ICD-10-CM

## 2017-02-11 DIAGNOSIS — C321 Malignant neoplasm of supraglottis: Secondary | ICD-10-CM

## 2017-02-11 DIAGNOSIS — C7800 Secondary malignant neoplasm of unspecified lung: Secondary | ICD-10-CM

## 2017-02-11 DIAGNOSIS — Z7189 Other specified counseling: Secondary | ICD-10-CM

## 2017-02-11 NOTE — Progress Notes (Signed)
Oncology Nurse Navigator Documentation  To provide support, encouragement and care continuity, met with Kevin Martin and his wife during appt with Dr. Alvy Bimler. He reported:  Minimal throat paid 2/10 with swallowing.  Drinking 2-3 Boost daily, eating at near baseline.  Return of taste buds.  Persistent thick phlegm in back of throat.  Dry mouth, especially at HS.  I encouraged use of humidifier, Biotene gel. They voiced understanding of chemotherapy options available, prognosis with and without chemotherapy. He was adamant he did not want tmt requiring continuous-feed pump, will consider other options. Dr. Alvy Bimler encouraged further discussion at home.   They understand to contact me or Dr. Calton Dach office with decision in the next couple of days.  Gayleen Orem, RN, BSN, [place] [facility] Oncology Nurse [place] [facility] [place] [phone]

## 2017-02-11 NOTE — Progress Notes (Signed)
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 I would be surprised if this patient died in the next year Intent of Therapy: Non-Curative / Palliative Intent, Discussed with Patient

## 2017-02-12 ENCOUNTER — Encounter: Payer: Self-pay | Admitting: Hematology and Oncology

## 2017-02-12 DIAGNOSIS — Z7189 Other specified counseling: Secondary | ICD-10-CM | POA: Insufficient documentation

## 2017-02-12 DIAGNOSIS — C7800 Secondary malignant neoplasm of unspecified lung: Secondary | ICD-10-CM | POA: Insufficient documentation

## 2017-02-12 NOTE — Assessment & Plan Note (Signed)
The patient is aware he has incurable disease and treatment is strictly palliative. We discussed importance of Advanced Directives and Living will. We discussed CODE STATUS; the patient desires to remain in full code for now

## 2017-02-12 NOTE — Assessment & Plan Note (Signed)
He has no recent exacerbation of congestive heart failure He will continue medical management

## 2017-02-12 NOTE — Progress Notes (Signed)
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 Wall thickness was increased in a pattern of severe LVH. Systolic function was normal. The estimated ejection fraction was in the range of 55% to 60%. - Aortic valve: There was moderate stenosis. There was mild regurgitation. - Left atrium: The atrium was moderately dilated. - Right atrium: The atrium was moderately dilated. - Atrial septum: A patent foramen ovale cannot be excluded. - Pulmonary arteries: PA peak pressure: 44 mm Hg (S). - Impressions: Gradients and degree of AS similar to 2016.      08/28/2016 Initial Diagnosis    He saw ENT for evaluation of throat pain. Office evaluation: possible left base of tongue mass, most likely carcinoma. There is no palpable adenopathy. It does appear to extend into the area epiglottic fold, possibly into the piriform, medial side.  The posterior soft palate, uvula, tongue base and vallecula were visualized and appeared healthy without mucosal masses or lesions, except for a mass arising in the left base of tongue that seems to extend over to the aryepiglottic fold and possibly the medial wall of the puriform although it is difficult to tell for sure. The endolarynx is not involved. The epiglottis, aryepiglottic folds, hypopharynx, supraglottis, glottis were visualized and appeared healthy without mucosal masses or lesions. Vocal fold mobility was intact and symmetric.  The scope was withdrawn from the nose. He tolerated the procedure well.           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 - SEE COMMENT.      09/19/2016 Surgery    She underwent laryngoscopy: There is an exophytic slightly friable mass involving the left area epiglottic fold, medial wall of the piriform sinus, and with extension to the apex. The lateral wall did not appear to be involved. The tongue base was not involved but there may have been slight involvement of the vallecula inferiorly. It did not cross the midline. The esophageal introitus was clear. The remainder of the larynx and hypopharynx was all without lesions.       10/07/2016 PET scan    Left lateral pharyngeal mass has a maximum standard uptake value of 15.1. There are 2 involved adjacent left IIa lymph nodes. No other hypermetabolic lesions identified. 2. Scattered small pulmonary nodules, below sensitive PET-CT size thresholds. Given the degree of motion artifact on the lung sequence, I would recommend a dedicated chest CT in order to provide a baseline accurate measurement for these nodules so that they can be followed up by size. I am skeptical that these are metastatic but surveillance is likely warranted. 3. Other imaging findings of potential clinical significance: Atherosclerosis including the carotid bulbs, aortic arch, coronary arteries, and aortoiliac tree. Cardiomegaly. Right hydrocele. Asymmetric facet arthropathy on the right at C2-3 which is somewhat hypermetabolic but ascribed to degenerative or erosive arthropathy.      10/09/2016 Imaging    CT chest showed scattered bilateral pulmonary nodules measuring up to 6 mm. As mentioned on the PET-CT, these are below the size the  cut off for reliable resolution on PET imaging. Metastatic disease is a concern and close follow-up is recommended      10/23/2016 - 12/11/2016 Radiation Therapy    Laryngopharynx and Bilateral Neck gross disease treated to 70 Gy in 35 fractions (56-63Gy given to  empiric nodal regions)      01/30/2017 Imaging    Ct chest: Numerous BILATERAL pulmonary nodules significantly increased in number and sizes since previous exam compatible with progressive pulmonary metastatic disease. Aortic atherosclerosis and coronary artery calcification. Aortic valvular calcification, consistent with history of aortic stenosis as seen by prior echocardiography.  Omitted from initial impression is recommendation for aneurysmal dilatation of the ascending thoracic aorta, 4.4 cm greatest size.        INTERVAL HISTORY: Please see below for problem oriented charting. The patient returns with his wife to review recent imaging study which indicated disease progression in his lungs He is not symptomatic Denies chest pain, cough or hemoptysis. He denies recent exacerbation of congestive heart failure Since radiation treatment was completed, he denies dysphagia, pain or significant dry mouth  REVIEW OF SYSTEMS:   Constitutional: Denies fevers, chills or abnormal weight loss Eyes: Denies blurriness of vision Ears, nose, mouth, throat, and face: Denies mucositis or sore throat Respiratory: Denies cough, dyspnea or wheezes Cardiovascular: Denies palpitation, chest discomfort or lower extremity swelling Gastrointestinal:  Denies nausea, heartburn or change in bowel habits Skin: Denies abnormal skin rashes Lymphatics: Denies new lymphadenopathy or easy bruising Neurological:Denies numbness, tingling or new weaknesses Behavioral/Psych: Mood is stable, no new changes  All other systems were reviewed with the patient and are negative.  I have reviewed the past medical history, past surgical history, social history and family history with the patient and they are unchanged from previous note.  ALLERGIES:  has No Known Allergies.  MEDICATIONS:  Current Outpatient Prescriptions  Medication Sig Dispense Refill  . acetaminophen (TYLENOL) 500 MG tablet Take 1,000 mg by mouth  every 6 (six) hours as needed for mild pain.    Marland Kitchen amLODipine (NORVASC) 10 MG tablet Take 1 tablet (10 mg total) by mouth daily. 30 tablet 2  . folic acid (FOLVITE) 109 MCG tablet Take 400 mcg by mouth daily.    . furosemide (LASIX) 40 MG tablet Take 1 tablet (40 mg total) by mouth daily. 30 tablet 2  . lisinopril (PRINIVIL,ZESTRIL) 40 MG tablet Take 1 tablet (40 mg total) by mouth daily. 30 tablet 2  . loratadine (CLARITIN) 10 MG tablet Take 10 mg by mouth.    . omega-3 acid ethyl esters (LOVAZA) 1 G capsule Take 3 g by mouth daily.     . pravastatin (PRAVACHOL) 80 MG tablet Take 1 tablet (80 mg total) by mouth daily. (Patient taking differently: Take 80 mg by mouth every evening. ) 30 tablet 2  . rivaroxaban (XARELTO) 20 MG TABS tablet Take 1 tablet (20 mg total) by mouth daily with supper. 30 tablet 3  . thiamine 100 MG tablet Take 1 tablet (100 mg total) by mouth daily. 30 tablet 0  . emollient (BIAFINE) cream Apply 1 application topically as needed.    Marland Kitchen HYDROcodone-acetaminophen (NORCO/VICODIN) 5-325 MG tablet Take 1 tablet by mouth every 4 (four) hours as needed for moderate pain. Taper over the next 3 weeks. (Patient not taking: Reported on 02/11/2017) 60 tablet 0  . lidocaine (XYLOCAINE) 2 % solution Patient: Mix 1part 2% viscous lidocaine, 1part H20. Swish and swallow 13mL of this mixture, 70min before meals and at bedtime, up  to QID (Patient not taking: Reported on 12/22/2016) 100 mL 5  . Multiple Vitamin (MULTIVITAMIN WITH MINERALS) TABS tablet Take 1 tablet by mouth daily. 50+ for Men    . ranitidine (ZANTAC) 150 MG tablet Take 150 mg by mouth.    . sucralfate (CARAFATE) 1 g tablet Dissolve 1 tablet in 10 mL H20 and swallow up to QID for sore throat. (Patient not taking: Reported on 12/22/2016) 40 tablet 5   No current facility-administered medications for this visit.     PHYSICAL EXAMINATION: ECOG PERFORMANCE STATUS: 0 - Asymptomatic  Vitals:   02/11/17 1214  BP: (!) 148/54   Pulse: (!) 59  Resp: 20  Temp: 97.8 F (36.6 C)   Filed Weights   02/11/17 1214  Weight: 214 lb 1.6 oz (97.1 kg)    GENERAL:alert, no distress and comfortable SKIN: skin color, texture, turgor are normal, no rashes or significant lesions EYES: normal, Conjunctiva are pink and non-injected, sclera clear Musculoskeletal:no cyanosis of digits and no clubbing  NEURO: alert & oriented x 3 with fluent speech, no focal motor/sensory deficits  LABORATORY DATA:  I have reviewed the data as listed    Component Value Date/Time   NA 136 11/18/2016 0853   K 4.4 11/18/2016 0853   CL 103 09/15/2016 0819   CO2 28 11/18/2016 0853   GLUCOSE 122 11/18/2016 0853   BUN 15.4 11/18/2016 0853   CREATININE 0.9 11/18/2016 0853   CALCIUM 9.6 11/18/2016 0853   PROT 6.6 09/15/2016 0819   ALBUMIN 3.6 09/15/2016 0819   AST 21 09/15/2016 0819   ALT 19 09/15/2016 0819   ALKPHOS 47 09/15/2016 0819   BILITOT 1.1 09/15/2016 0819   GFRNONAA >60 09/15/2016 0819   GFRAA >60 09/15/2016 0819    No results found for: SPEP, UPEP  Lab Results  Component Value Date   WBC 9.0 09/15/2016   NEUTROABS 3.4 01/14/2015   HGB 14.8 09/15/2016   HCT 43.4 09/15/2016   MCV 89.7 09/15/2016   PLT 183 09/15/2016      Chemistry      Component Value Date/Time   NA 136 11/18/2016 0853   K 4.4 11/18/2016 0853   CL 103 09/15/2016 0819   CO2 28 11/18/2016 0853   BUN 15.4 11/18/2016 0853   CREATININE 0.9 11/18/2016 0853      Component Value Date/Time   CALCIUM 9.6 11/18/2016 0853   ALKPHOS 47 09/15/2016 0819   AST 21 09/15/2016 0819   ALT 19 09/15/2016 0819   BILITOT 1.1 09/15/2016 0819       RADIOGRAPHIC STUDIES: I have personally reviewed the radiological images as listed and agreed with the findings in the report. Ct Chest Wo Contrast  Addendum Date: 01/30/2017   ADDENDUM REPORT: 01/30/2017 13:22 ADDENDUM: Omitted from initial impression is recommendation for aneurysmal dilatation of the ascending  thoracic aorta, 4.4 cm greatest size. Recommend annual imaging followup by CTA or MRA. This recommendation follows 2010 ACCF/AHA/AATS/ACR/ASA/SCA/SCAI/SIR/STS/SVM Guidelines for the Diagnosis and Management of Patients with Thoracic Aortic Disease. Circulation. 2010; 121: G867-Y195 Electronically Signed   By: Lavonia Dana M.D.   On: 01/30/2017 13:22   Result Date: 01/30/2017 CLINICAL DATA:  Followup lung nodules identified on PET-CT ; history laryngeal cancer, atrial fibrillation, CHF, hypertension EXAM: CT CHEST WITHOUT CONTRAST TECHNIQUE: Multidetector CT imaging of the chest was performed following the standard protocol without IV contrast. Sagittal and coronal MPR images reconstructed from axial data set. COMPARISON:  CT chest 10/09/2016, PET-CT 10/07/2016 FINDINGS: Cardiovascular: Atherosclerotic calcifications  aorta, proximal great vessels and coronary arteries. Aneurysmal dilatation ascending thoracic aorta 4.5 cm image 75 previously 4.4 cm. Minimal pericardial fluid. Aortic valvular calcifications. Dilated main pulmonary artery question pulmonary arterial hypertension. Enlargement of cardiac chambers especially atria. Mediastinum/Nodes: Question mild diffuse esophageal wall thickening. Base of cervical region grossly unremarkable. Few scattered normal size mediastinal lymph nodes without definite thoracic adenopathy. Lungs/Pleura: Numerous BILATERAL pulmonary nodules markedly progressive in number and sizes since previous exam. Some of these nodules demonstrate irregular margins. Largest nodule is 14 x 11 mm in posterior RIGHT lower lobe base image 138. None of the nodules appear cavitary. Findings most consistent with widespread pulmonary metastases progressive since previous study. No segmental consolidation, pleural effusion or pneumothorax. Upper Abdomen: Unremarkable Musculoskeletal: No definite acute osseous lesions. IMPRESSION: Numerous BILATERAL pulmonary nodules significantly increased in number and  sizes since previous exam compatible with progressive pulmonary metastatic disease. Aortic atherosclerosis and coronary artery calcification. Aortic valvular calcification, consistent with history of aortic stenosis as seen by prior echocardiography. Electronically Signed: By: Lavonia Dana M.D. 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[place]) I reviewed the imaging report with the patient I discussed with him and his wife the rationale of not pursuing biopsy We discussed approach to stage IV metastatic cancer At current state, his disease is not curable I review with him the current guidelines We discussed several options I recommend port placement before we proceed with treatment In order for port placement to happen,  we would need permission from his cardiologist to hold Xarelto for minimum 48 hours before port placement The patient has indicated he does not want to infusional therapy such as 5-FU treatment It would be reasonable to consider combination treatment with carboplatin and paclitaxel with or without cetuximab At the end of the day, upon hearing side effects of treatment, the patient is undecided He is currently asymptomatic from metastatic cancer We will regroup again in the near future for further discussion I have not placed return visit appointment  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 We discussed importance of Advanced Directives and Living will. We discussed CODE STATUS; the patient desires to remain in full code for now   No orders of the defined types were placed in this encounter.  All questions were answered. The patient knows to call the clinic with any problems, questions or concerns. No barriers to learning was detected. I spent 60 minutes counseling the patient face to face. The total time spent in the appointment was 80 minutes and more than 50% was on counseling and review of test results     Heath Lark, MD 02/12/2017 2:05 PM

## 2017-02-12 NOTE — Assessment & Plan Note (Signed)
I reviewed the imaging report with the patient I discussed with him and his wife the rationale of not pursuing biopsy We discussed approach to stage IV metastatic cancer At current state, his disease is not curable I review with him the current guidelines We discussed several options I recommend port placement before we proceed with treatment In order for port placement to happen,  we would need permission from his cardiologist to hold Xarelto for minimum 48 hours before port placement The patient has indicated he does not want to infusional therapy such as 5-FU treatment It would be reasonable to consider combination treatment with carboplatin and paclitaxel with or without cetuximab At the end of the day, upon hearing side effects of treatment, the patient is undecided He is currently asymptomatic from metastatic cancer We will regroup again in the near future for further discussion I have not placed return visit appointment

## 2017-02-12 NOTE — Assessment & Plan Note (Signed)
He has an enlarging pulmonary metastasis I do not recommend biopsy His case was discussed at the ENT tumor board and overall consensus is the enlarging pulmonary masses represent metastatic cancer to the lung He is currently asymptomatic We discussed natural disease progression if he choose to go without treatment

## 2017-02-13 ENCOUNTER — Telehealth: Payer: Self-pay

## 2017-02-13 ENCOUNTER — Other Ambulatory Visit: Payer: Self-pay | Admitting: Hematology and Oncology

## 2017-02-13 DIAGNOSIS — C321 Malignant neoplasm of supraglottis: Secondary | ICD-10-CM

## 2017-02-13 DIAGNOSIS — R918 Other nonspecific abnormal finding of lung field: Secondary | ICD-10-CM

## 2017-02-13 NOTE — Telephone Encounter (Signed)
1) can you call cardiologist if OK to stop Xarelto for 2 days for port placement? OK to resume after port placement, same evening 2) can you call IR to make sure 2 days are enough for xarelto to be stopped before port? If so, try to schedule port to be done on 7/6. Ask radiologist to make sure they draw CBC and CMP 3) I will place scheduling msg to see him back on 7/9 and start first dose on 7/9

## 2017-02-13 NOTE — Telephone Encounter (Signed)
Called patient and family member. He would like to do the chemotherapy that is once every 3 weeks. Would like to get a port and start after July 4th. Would like to start 7-9 or close to that.

## 2017-02-16 ENCOUNTER — Ambulatory Visit: Payer: Medicare Other

## 2017-02-16 ENCOUNTER — Telehealth: Payer: Self-pay | Admitting: *Deleted

## 2017-02-16 NOTE — Telephone Encounter (Signed)
Pt wants to go to [facility] for the second opinion

## 2017-02-16 NOTE — Telephone Encounter (Signed)
No note

## 2017-02-16 NOTE — Telephone Encounter (Signed)
Wife states patient is wanting a second opinion. Will call back to let us know if he wants [facility] or [facility].  Cardiologist is Dr Harrington Challenger (did not know first name)

## 2017-02-16 NOTE — Telephone Encounter (Signed)
Referral called to [facility] for second opinion

## 2017-02-16 NOTE — Telephone Encounter (Signed)
Referral called to [facility] @ [facility]. [phone]. They will contact patient to coordinate appt

## 2017-02-19 ENCOUNTER — Telehealth: Payer: Self-pay | Admitting: Hematology and Oncology

## 2017-02-19 NOTE — Telephone Encounter (Signed)
sw Kevin Martin to confirm 7/6 appt at 230 per sch msg. Kevin Martin stated they are waiting for [facility] to call to get a 2nd opinion.

## 2017-02-27 ENCOUNTER — Telehealth: Payer: Self-pay | Admitting: *Deleted

## 2017-02-27 NOTE — Telephone Encounter (Signed)
Oncology Nurse Navigator Documentation  Spoke with Mr. Tullis wife in follow-up to their 6/27 consultation at [facility]. She reported they were encouraged with the visit, plan to pursue further treatment per guidance provided. She asked that I cancel future MedOnc appts, noted they want to have re-staging PET and follow-up with Dr. Isidore Moos in late July as planned. Drs. Alvy Bimler and Isidore Moos provided update.  Gayleen Orem, RN, BSN, [place] Oncology Nurse [place] [facility] [phone]

## 2017-03-02 ENCOUNTER — Telehealth: Payer: Self-pay | Admitting: *Deleted

## 2017-03-02 ENCOUNTER — Ambulatory Visit: Payer: Medicare Other

## 2017-03-02 NOTE — Telephone Encounter (Signed)
CALLED PATIENT TO INFORM OF PET SCAN FOR 03-23-17- ARRIVAL TIME - 7:30 AM @ [facility], PT. TO BE NPO- AFTER MIDNIGHT, PT. TO GET HIS RESULTS ON 03-24-17 @ 2 PM WITH DR. Isidore Moos, SPOKE WITH PATIENT'S SGO- SHARON LINDER AND SHE IS AWARE OF THESE APPTS.

## 2017-03-06 ENCOUNTER — Ambulatory Visit: Payer: Medicare Other | Admitting: Hematology and Oncology

## 2017-03-06 ENCOUNTER — Other Ambulatory Visit: Payer: Medicare Other

## 2017-03-09 ENCOUNTER — Ambulatory Visit: Payer: Medicare Other

## 2017-03-16 ENCOUNTER — Ambulatory Visit: Payer: Medicare Other | Attending: Radiation Oncology

## 2017-03-16 DIAGNOSIS — I890 Lymphedema, not elsewhere classified: Secondary | ICD-10-CM | POA: Insufficient documentation

## 2017-03-16 HISTORY — PX: BRONCHOSCOPY: SUR163

## 2017-03-17 NOTE — Progress Notes (Addendum)
Mr. Kevin Martin presents for follow up of radiation completed 12/11/16 to his Laryngopharynx and bilateral neck.   Pain issues, if any: He denies  Using a feeding tube?: N/A Weight changes, if any:  Wt Readings from Last 3 Encounters:  03/24/17 204 lb 12.8 oz (92.9 kg)  02/11/17 214 lb 1.6 oz (97.1 kg)  12/22/16 213 lb (96.6 kg)   Swallowing issues, if any: Food will get "hung" in his throat. He needs to drink liquids when eating.  Smoking or chewing tobacco? No Using fluoride trays daily? No Last ENT visit was on: Dr. Constance Holster, not since diagnosis.  Other notable issues, if any:  He reports continued taste changes which are improving.  He is seeing a Futures trader at [facility].  He had a bronchoscopy performed on 03/16/17 at [facility]. Surgical pathology in care everywhere.  He is to have repeat scan in August at [facility] with Dr. Georgeanna Lea.  PET scan 03/23/17  BP 131/62   Pulse (!) 56   Temp 98 F (36.7 C)   Ht 6\' 1"  (1.854 m)   Wt 204 lb 12.8 oz (92.9 kg)   SpO2 99% Comment: room air  BMI 27.02 kg/m

## 2017-03-23 ENCOUNTER — Ambulatory Visit (HOSPITAL_COMMUNITY)
Admission: RE | Admit: 2017-03-23 | Discharge: 2017-03-23 | Disposition: A | Discharge status: Home / Self Care | Payer: Medicare Other | Source: Ambulatory Visit | Attending: Radiation Oncology | Admitting: Radiation Oncology

## 2017-03-23 DIAGNOSIS — C7800 Secondary malignant neoplasm of unspecified lung: Secondary | ICD-10-CM | POA: Diagnosis not present

## 2017-03-23 DIAGNOSIS — I2510 Atherosclerotic heart disease of native coronary artery without angina pectoris: Secondary | ICD-10-CM | POA: Insufficient documentation

## 2017-03-23 DIAGNOSIS — C321 Malignant neoplasm of supraglottis: Secondary | ICD-10-CM | POA: Insufficient documentation

## 2017-03-23 DIAGNOSIS — Z9889 Other specified postprocedural states: Secondary | ICD-10-CM | POA: Insufficient documentation

## 2017-03-23 DIAGNOSIS — I700 Atherosclerosis of aorta: Secondary | ICD-10-CM | POA: Insufficient documentation

## 2017-03-23 DIAGNOSIS — I288 Other diseases of pulmonary vessels: Secondary | ICD-10-CM | POA: Diagnosis not present

## 2017-03-23 DIAGNOSIS — C778 Secondary and unspecified malignant neoplasm of lymph nodes of multiple regions: Secondary | ICD-10-CM | POA: Diagnosis not present

## 2017-03-23 LAB — GLUCOSE, CAPILLARY: Glucose-Capillary: 128 mg/dL — ABNORMAL HIGH (ref 65–99)

## 2017-03-23 MED ORDER — FLUDEOXYGLUCOSE F - 18 (FDG) INJECTION
10.0000 | Freq: Once | INTRAVENOUS | Status: AC | PRN
  Administered 2017-03-23: 10 via INTRAVENOUS

## 2017-03-24 ENCOUNTER — Encounter: Payer: Self-pay | Admitting: Radiation Oncology

## 2017-03-24 ENCOUNTER — Ambulatory Visit
Admission: RE | Admit: 2017-03-24 | Discharge: 2017-03-24 | Disposition: A | Discharge status: Home / Self Care | Payer: Medicare Other | Source: Ambulatory Visit | Attending: Radiation Oncology | Admitting: Radiation Oncology

## 2017-03-24 VITALS — BP 131/62 | HR 56 | Temp 98.0°F | Ht 73.0 in | Wt 204.8 lb

## 2017-03-24 DIAGNOSIS — R634 Abnormal weight loss: Secondary | ICD-10-CM | POA: Insufficient documentation

## 2017-03-24 DIAGNOSIS — Z79899 Other long term (current) drug therapy: Secondary | ICD-10-CM | POA: Insufficient documentation

## 2017-03-24 DIAGNOSIS — Z1329 Encounter for screening for other suspected endocrine disorder: Secondary | ICD-10-CM | POA: Diagnosis not present

## 2017-03-24 DIAGNOSIS — C321 Malignant neoplasm of supraglottis: Secondary | ICD-10-CM | POA: Insufficient documentation

## 2017-03-24 LAB — TSH: TSH: 1.219 m[IU]/L (ref 0.320–4.118)

## 2017-03-24 NOTE — Progress Notes (Signed)
Radiation Oncology         [phone] ________________________________  Name: Kevin Martin MRN: [id number]  Date: 03/24/2017  DOB: [birth year]  Follow-Up Visit Note  CC: Berkley Harvey, NP  Izora Gala, MD  Diagnosis and Prior Radiotherapy:       ICD-10-CM   1. Loss of weight R63.4 TSH  2. 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 Screening for hypothyroidism Z13.29 TSH  Clinical stage IVA (cT2, cN2b, cM0) cancer of supraglottis  Radiation treatment dates:   10/23/16 - 12/11/16 Site/dose:   Laryngopharynx and Bilateral Neck gross disease treated to 70 Gy in 35 fractions (56-63Gy given to empiric nodal regions)   CHIEF COMPLAINT:  Here for follow-up and surveillance of laryngeal cancer  Narrative:  The patient returns today for routine follow-up of radiation completed 12/11/16 and review of recent imaging. He is accompanied by his wife today.  He had a PET scan on 03/23/17 which showed progressive pulmonary metastases and development of mediastinal nodal metastases. The head and neck cancer appears to have completely resolved in the throat and neck nodes. I have reviewed his images.   The patient was seen in consultation with [facility]; according to their notes, biopsy of the lung nodules was recommended by Dr. Georgeanna Lea. Biopsy of right lower lung, transbronchial biopsy, did not reveal malignancy. Another endobronchial biopsy did not reveal malignancy. Bronchial lavage of the left lower lobe was negative for malignancy.  However, imaging is highly suspicious. My impression is that med/onc at [facility] would ideally like tissue before customizing a plan for systemic therapy.  They will followup with him in the near future, and patient has a CD with his imaging to show Dr Ready.  On review of systems, the patient denies pain. He has lost 10 lbs in the last month. He reports food occassionally gets stuck in his throat and he must drink liquids while eating. He  reports continued taste changes, though this is improving. He is not using tobacco products. His last ENT visit was with Dr. Constance Holster at time of diagnosis.   The patient has copies of PET scan which he will share with other care providers as needed.    ALLERGIES:  has No Known Allergies.  Meds: Current Outpatient Prescriptions  Medication Sig Dispense Refill  . acetaminophen (TYLENOL) 500 MG tablet Take 1,000 mg by mouth every 6 (six) hours as needed for mild pain.    Marland Kitchen amLODipine (NORVASC) 10 MG tablet Take 1 tablet (10 mg total) by mouth daily. 30 tablet 2  . folic acid (FOLVITE) 283 MCG tablet Take 400 mcg by mouth daily.    . furosemide (LASIX) 40 MG tablet Take 1 tablet (40 mg total) by mouth daily. 30 tablet 2  . furosemide (LASIX) 40 MG tablet Take by mouth.    Marland Kitchen HYDROcodone-acetaminophen (NORCO/VICODIN) 5-325 MG tablet Take 1 tablet by mouth every 4 (four) hours as needed for moderate pain. Taper over the next 3 weeks. 60 tablet 0  . lisinopril (PRINIVIL,ZESTRIL) 40 MG tablet Take 1 tablet (40 mg total) by mouth daily. (Patient taking differently: Take 40 mg by mouth daily. He is taking 1/2 tablet daily total of 20 mg) 30 tablet 2  . loratadine (CLARITIN) 10 MG tablet Take 10 mg by mouth.    . pravastatin (PRAVACHOL) 80 MG tablet Take 1 tablet (80 mg total) by mouth daily. (Patient taking differently: Take 80 mg by mouth every evening. ) 30 tablet 2  .  rivaroxaban (XARELTO) 20 MG TABS tablet Take 1 tablet (20 mg total) by mouth daily with supper. 30 tablet 3  . thiamine 100 MG tablet Take 1 tablet (100 mg total) by mouth daily. 30 tablet 0  . docusate sodium (COLACE) 100 MG capsule Take by mouth.    . lidocaine (XYLOCAINE) 2 % solution Patient: Mix 1part 2% viscous lidocaine, 1part H20. Swish and swallow 1mL of this mixture, 78min before meals and at bedtime, up to QID (Patient not taking: Reported on 12/22/2016) 100 mL 5  . Multiple Vitamin (MULTIVITAMIN WITH MINERALS) TABS tablet Take 1  tablet by mouth daily. 50+ for Men    . omega-3 acid ethyl esters (LOVAZA) 1 G capsule Take 3 g by mouth daily.     . polyethylene glycol (MIRALAX / GLYCOLAX) packet Take by mouth.    . ranitidine (ZANTAC) 150 MG tablet Take 150 mg by mouth.    . sucralfate (CARAFATE) 1 g tablet Dissolve 1 tablet in 10 mL H20 and swallow up to QID for sore throat. (Patient not taking: Reported on 12/22/2016) 40 tablet 5   No current facility-administered medications for this encounter.     Physical Findings: The patient is in no acute distress. Patient is alert and oriented. Wt Readings from Last 3 Encounters:  03/24/17 204 lb 12.8 oz (92.9 kg)  02/11/17 214 lb 1.6 oz (97.1 kg)  12/22/16 213 lb (96.6 kg)    height is 6\' 1"  (1.854 m) and weight is 204 lb 12.8 oz (92.9 kg). His temperature is 98 F (36.7 C). His blood pressure is 131/62 and his pulse is 56 (abnormal). His oxygen saturation is 99%.  General: Alert and oriented, in no acute distress. HEENT: Head is normocephalic. Oropharynx is clear. Patient is edentulous. Neck: He has anterior neck lymphedema. No palpable adenopathy in the neck. Heart: Regular in rate and rhythm. Systolic murmur which radiates through the precordium. Chest: Lungs are clears to auscultation bilaterally. Skin: Skin in treatment fields has healed well. Lymphatics: see Neck Exam Abdomen: Soft, non tender. Psychiatric: Judgment and insight are intact. Affect is appropriate.  Lab Findings: Lab Results  Component Value Date   WBC 9.0 09/15/2016   HGB 14.8 09/15/2016   HCT 43.4 09/15/2016   MCV 89.7 09/15/2016   PLT 183 09/15/2016    Lab Results  Component Value Date   TSH 1.219 03/24/2017    Radiographic Findings: Nm Pet Image Restag (ps) Skull Base To Thigh  Result Date: 03/23/2017 CLINICAL DATA:  Subsequent treatment strategy for restaging of head neck cancer. Supraglottic primary. Status post radiation therapy. EXAM: NUCLEAR MEDICINE PET SKULL BASE TO THIGH  TECHNIQUE: 10.0 mCi F-18 FDG was injected intravenously. Full-ring PET imaging was performed from the skull base to thigh after the radiotracer. CT data was obtained and used for attenuation correction and anatomic localization. FASTING BLOOD GLUCOSE:  Value: 128 mg/dl COMPARISON:  10/07/2016.  Chest CT 01/30/2017. FINDINGS: NECK Interval resolution of previously described pharyngeal and left-sided cervical nodal hypermetabolism. No cervical adenopathy. Dense bilateral carotid atherosclerosis. CHEST Bilateral hypermetabolic pulmonary nodules, consistent with metastatic disease. Index right lower lobe pulmonary nodule measures 10 mm and a S.U.V. max of 3.1 on image 50/series a 8. This is felt to be enlarged from 01/30/2017 CT. Index left lower lobe pulmonary nodule measures 12 mm and a S.U.V. max of 8.1 on image 36/series 8. This may have enlarged minimally from 10 mm on 01/30/2017. Left paratracheal node measures 9 mm and a S.U.V. max of  3.7 on image 75/series 4. This is newly hypermetabolic compared to the prior. Subcarinal node measures 10 mm and a S.U.V. max of 3.5 on image 82/ series 4. Also newly hypermetabolic. Mild cardiomegaly with multivessel coronary artery atherosclerosis. Trace bilateral pleural fluid is new since the prior. Pulmonary artery enlargement, 4.2 cm outflow tract. ABDOMEN/PELVIS No abdominopelvic parenchymal or nodal hypermetabolism identified. Dense abdominal aortic atherosclerosis. Mild prostatomegaly. Bilateral hydroceles. SKELETON No evidence of hypermetabolic osseous metastasis. Prominent right-sided focus of hypermetabolism is again identified at C2-3, likely degenerative. IMPRESSION: 1. Pulmonary metastasis, progressive since 10/07/2016 and likely progressive since 01/30/2017 diagnostic CT. 2. Response to therapy of head neck primary and left cervical nodal metastasis. 3. Development of mediastinal nodal metastasis. 4. Coronary artery atherosclerosis. Aortic Atherosclerosis  (ICD10-I70.0). 5. Pulmonary artery enlargement suggests pulmonary arterial hypertension. 6. New trace bilateral pleural fluid since 01/30/2017. Electronically Signed   By: Abigail Miyamoto M.D.   On: 03/23/2017 10:30    Impression/Plan:    1) Head and Neck Cancer Status: healing well from RT, CR in neck, metastatic disease in chest  2) Nutritional Status: 10 lbs weight loss in the last month - increase po intake, more boost, then more food as taste improves. PEG tube: none  3) Risk Factors: The patient has been educated about risk factors including alcohol and tobacco abuse; they understand that avoidance of alcohol and tobacco is important to prevent recurrences as well as other cancers.  4) Swallowing: functional  5) Dental: no teeth  6) Thyroid function: WNL today. Patient will continue to re-check annually with PCP or med/onc - he and his wife state understanding of the importance of this. Lab Results  Component Value Date   TSH 1.219 03/24/2017    7) Other: The patient has copies of PET scan which he will share with other care providers as needed.   I will refer the patient back to PT for massage therapy to reduce neck edema.  8) Follow up at [facility] as indicated for systemic therapy. The patient may follow up with me on an as needed basis; the patient may contact Gayleen Orem, RN, Head and Neck Nurse Navigator with any concerns that may arise.  I spent 15 minutes face to face with the patient and more than 50% of that time was spent in counseling and/or coordination of care. _______________________________________________  Eppie Gibson, MD  This document serves as a record of services personally performed by Eppie Gibson, MD. It was created on her behalf by Maryla Morrow, a trained medical scribe. The creation of this record is based on the scribe's personal observations and the provider's statements to them. This document has been checked and approved by the attending provider.

## 2017-03-25 ENCOUNTER — Ambulatory Visit: Payer: Medicare Other | Admitting: Physical Therapy

## 2017-03-25 ENCOUNTER — Telehealth: Payer: Self-pay | Admitting: *Deleted

## 2017-03-25 ENCOUNTER — Other Ambulatory Visit: Payer: Self-pay | Admitting: Radiation Oncology

## 2017-03-25 DIAGNOSIS — I890 Lymphedema, not elsewhere classified: Secondary | ICD-10-CM | POA: Diagnosis present

## 2017-03-25 DIAGNOSIS — C321 Malignant neoplasm of supraglottis: Secondary | ICD-10-CM

## 2017-03-25 NOTE — Therapy (Signed)
[place], [place], [address] Phone: [phone]   Fax:  [phone]  Physical Therapy Evaluation  Patient Details  Name: Kevin Martin MRN: [id number] Date of Birth: Sep 14, 1950 Referring Provider: Dr. Eppie Gibson  Encounter Date: 03/25/2017      PT End of Session - 03/25/17 1721    Visit Number 1   Number of Visits 1   Date for PT Re-Evaluation 05/01/17   PT Start Time 1300   PT Stop Time 1348   PT Time Calculation (min) 48 min   Activity Tolerance Patient tolerated treatment well   Behavior During Therapy [facility] for tasks assessed/performed      Past Medical History:  Diagnosis Date  . Aortic stenosis    moderate by 12/16/15 echo  . Atrial fibrillation ([place])   . Cardiomegaly   . CHF (congestive heart failure) ([place])   . History of radiation therapy 10/23/16- 12/11/16   Laryngopharynx and bilateral neck, 70 Gy in 35 fractions.   . Hypertension   . Shortness of breath dyspnea     Past Surgical History:  Procedure Laterality Date  . BRONCHOSCOPY  03/16/2017   performed at [facility]   . COLONOSCOPY    . DIRECT LARYNGOSCOPY N/A 09/19/2016   Procedure: DIRECT LARYNGOSCOPY with BIOPSY;  Surgeon: Izora Gala, MD;  Location: [place];  Service: ENT;  Laterality: N/A;  . ESOPHAGOSCOPY N/A 09/19/2016   Procedure: ESOPHAGOSCOPY;  Surgeon: Izora Gala, MD;  Location: [place];  Service: ENT;  Laterality: N/A;  . FRACTURE SURGERY Right    right arm  . NECK SURGERY      There were no vitals filed for this visit.       Subjective Assessment - 03/25/17 1302    Subjective Dr. Isidore Moos just wants you to show her how to do the neck massage.  You see this swelling here? Swelling started during the course of radiation, but it's gotten bigger.   Pertinent History Pt. with supraglottic invasive squamous cell carcinoma diagnosed 09/19/16.  Plans to have XRT only; no chemo due to h/o cardiac problems.  Will start XRT 10/23/16.   CHF, atrial fibrillation, cardiomegaly, HTN.   Currently in Pain? No/denies               LYMPHEDEMA/ONCOLOGY QUESTIONNAIRE - 03/25/17 1303      Head and Neck   4 cm superior to sternal notch around neck 48 cm   6 cm superior to sternal notch around neck 49 cm   8 cm superior to sternal notch around neck 50 cm   Other Has lost 24 lbs. total, 10 lbs. recently         Objective measurements completed on examination: See above findings.          [place] Adult PT Treatment/Exercise - 03/25/17 0001      Self-Care   Self-Care Other Self-Care Comments   Other Self-Care Comments  fashioned foam chip pack in stockinette and instructed patient in its use for compression     Manual Therapy   Manual Therapy Manual Lymphatic Drainage (MLD)   Manual Lymphatic Drainage (MLD) Performed and instructed patient's significant other in the following, in supine with head of bed elevated:  diaphragmatic breathing, supraclavicular fossae, bilat. axillae, bilat. shoulder collectors; posterolateral, lateral, anterolateral, and anterior neck; chin, cheeks and preauricular areas; then retraced steps with pathway down to axillae                PT  Education - 03/25/17 1721    Education provided Yes   Education Details use of compression pack for neck, manual lymph drainage, briefly about manufactured compression garments (which it sounded like they would go to [employer] to obtain)   Person(s) Educated Patient;Spouse   Methods Explanation;Demonstration;Verbal cues;Handout   Comprehension Verbalized understanding;Returned demonstration                [facility] Goals - 03/25/17 1728      CC Long Term Goal  #1   Title Patient's significant other will be knowledgeable about performing manual lymph drainage for patient's neck   Status Achieved     CC Long Term Goal  #2   Title Patient will be knowledgeable about the availability of a manufactured compression garment    Status Achieved         Head and Neck Clinic Goals - 10/14/16 1049      Patient will be able to verbalize understanding of a home exercise program for cervical range of motion, posture, and walking.    Status Achieved     Patient will be able to verbalize understanding of proper sitting and standing posture.    Status Achieved     Patient will be able to verbalize understanding of lymphedema risk and availability of treatment for this condition.    Status Achieved            Plan - 03/25/17 1723    Clinical Impression Statement Patient who has been seen in this clinic for other reasons comes in today with neck swelling. Circumference measurements have increased slightly from previous measurements, though he has lost weight. He and his significant other seemed anxious to be instructed in manual lymph drainage in this session and not have follow-up. This was a re-eval.   Rehab Potential Good   Clinical Impairments Affecting Rehab Potential hx of radiation   PT Frequency One time visit   PT Treatment/Interventions ADLs/Self Care Home Management;DME Instruction;Patient/family education;Orthotic Fit/Training;Manual techniques;Manual lymph drainage;Compression bandaging;Taping   PT Next Visit Plan No follow-up planned; patient will call if he has further needs, so chart will be left open for the time being.   PT Home Exercise Plan manual lymph drainage   Consulted and Agree with Plan of Care Patient      Patient will benefit from skilled therapeutic intervention in order to improve the following deficits and impairments:  Increased edema  Visit Diagnosis: Lymphedema, not elsewhere classified - Plan: PT plan of care cert/re-cert      G-Codes - 54/09/81 1729    Functional Assessment Tool Used (Outpatient Only) lymphedema life impact scale   Functional Limitation Self care   Self Care Current Status (X9147) At least 1 percent but less than 20 percent impaired,  limited or restricted   Self Care Goal Status (W2956) At least 1 percent but less than 20 percent impaired, limited or restricted   Self Care Discharge Status [phone]) At least 1 percent but less than 20 percent impaired, limited or restricted       Problem List Patient Active Problem List   Diagnosis Date Noted  . Metastasis to lung ([place]) 02/12/2017  . Goals of care, counseling/discussion 02/12/2017  . Multiple lung nodules on CT 10/10/2016  . Alcoholism ([place]) 10/10/2016  . Cancer of supraglottis ([place]) 10/08/2016  . Chronic atrial fibrillation ([place]) 02/14/2015  . Chronic diastolic CHF (congestive heart failure) ([place]) 02/14/2015  . Chronic diastolic heart failure ([place]) 02/14/2015  . Prediabetes 01/23/2015  .  Tobacco abuse 01/23/2015  . Acute on chronic congestive heart failure ([place]) 01/15/2015  . Acute congestive heart failure with left ventricular diastolic dysfunction ([place]) 01/14/2015  . Alcohol abuse, daily use 01/14/2015  . Acute respiratory failure ([place]) 01/14/2015  . Acute hyponatremia 01/14/2015  . Acute right heart failure ([place]) 01/14/2015  . Congestive heart disease ([place])   . Right heart failure ([place]) 01/03/2015  . Cardiomegaly 01/03/2015  . Atrial fibrillation, controlled ([place]) 01/03/2015  . Moderate aortic stenosis 01/03/2015  . Chronic anticoagulation 01/03/2015  . Hypertension 01/03/2015  . Dyspnea 01/03/2015  . A-fib ([place]) 02/17/2014  . Long term current use of anticoagulant 02/17/2014  . Essential (primary) hypertension 11/01/2013  . Adult BMI 30+ 10/21/2013  . Adiposity 04/22/2013  . H/O cardiovascular disorder 04/22/2013  . Headache, tension-type 02/05/2013    Llesenia Fogal 03/25/2017, 5:32 PM  [place], [place], [address] Phone: [phone]   Fax:  [phone]  Name: Kevin Martin MRN: [id number] Date of Birth: Aug 22, 1951  Serafina Royals, PT 03/25/17 5:32 PM

## 2017-03-25 NOTE — Telephone Encounter (Signed)
Called patient to inform of appt. for PT on 03-25-17- arrival time - 12:50 pm @ [facility], spoke with Leonides Cave and she agreed to this appt.

## 2017-04-15 ENCOUNTER — Other Ambulatory Visit: Payer: Self-pay

## 2017-04-15 ENCOUNTER — Encounter: Payer: Self-pay | Admitting: Internal Medicine

## 2017-04-15 MED ORDER — RIVAROXABAN 20 MG PO TABS: 20.0000 mg | ORAL_TABLET | Freq: Every day | ORAL | 3 refills | Status: AC

## 2017-04-15 MED ORDER — RIVAROXABAN 20 MG PO TABS: 20.0000 mg | ORAL_TABLET | Freq: Every day | ORAL | 3 refills | Status: DC

## 2017-04-20 ENCOUNTER — Telehealth: Payer: Self-pay

## 2017-04-20 NOTE — Telephone Encounter (Signed)
[name], on DPR, is advised that I have completed the provider portion of the pts Wynetta Emery [employer] [place] pt assistance application for Xarelto and that Dr Harrington Challenger has signed it. [name] states that she will pick the application up from our front office later today and that she will mail it to [employer] and [place] herself.

## 2017-05-01 NOTE — Progress Notes (Signed)
Cardiology Office Note   Date:  05/08/2017   ID:  BURDELL PEED, DOB [birth year], MRN [id number]  PCP:  Berkley Harvey, NP  Cardiologist:   Dorris Carnes, MD   F/U of aortic stenosis, HTN, atrial fib     History of Present Illness: Kevin Martin is a 66 y.o. male with a history of HTN, chronic atrial fib, Mod AS, Tob use, EtOH use .  Bradycardia in pasat  B Blocker stopped  I saw the pt in November 2017  SInce seen has undergone XRT for squamous cell CA  Now undergoing cheom at [facility] for lesion on lung  INfusion every 3 wks  No dizziess  No PND.  Some chest tightness  Tightness occurs with or without activity  ? Stress ? Chemo       Current Meds  Medication Sig  . acetaminophen (TYLENOL) 500 MG tablet Take 1,000 mg by mouth every 6 (six) hours as needed for mild pain.  Marland Kitchen amLODipine (NORVASC) 10 MG tablet Take 1 tablet (10 mg total) by mouth daily.  Marland Kitchen docusate sodium (COLACE) 100 MG capsule Take by mouth.  . folic acid (FOLVITE) 712 MCG tablet Take 400 mcg by mouth daily.  . furosemide (LASIX) 40 MG tablet Take 1 tablet (40 mg total) by mouth daily.  . furosemide (LASIX) 40 MG tablet Take by mouth.  Marland Kitchen HYDROcodone-acetaminophen (NORCO/VICODIN) 5-325 MG tablet Take 1 tablet by mouth every 4 (four) hours as needed for moderate pain. Taper over the next 3 weeks.  . lidocaine (XYLOCAINE) 2 % solution Patient: Mix 1part 2% viscous lidocaine, 1part H20. Swish and swallow 54mL of this mixture, 31min before meals and at bedtime, up to QID  . lisinopril (PRINIVIL,ZESTRIL) 40 MG tablet Take 1 tablet (40 mg total) by mouth daily. (Patient taking differently: Take 40 mg by mouth daily. He is taking 1/2 tablet daily total of 20 mg)  . loratadine (CLARITIN) 10 MG tablet Take 10 mg by mouth.  . Multiple Vitamin (MULTIVITAMIN WITH MINERALS) TABS tablet Take 1 tablet by mouth daily. 50+ for Men  . omega-3 acid ethyl esters (LOVAZA) 1 G capsule Take 3 g by mouth daily.   . ondansetron (ZOFRAN) 8 MG tablet  Take 8 mg by mouth as directed.   . polyethylene glycol (MIRALAX / GLYCOLAX) packet Take by mouth.  . pravastatin (PRAVACHOL) 80 MG tablet Take 1 tablet (80 mg total) by mouth daily. (Patient taking differently: Take 80 mg by mouth every evening. )  . ranitidine (ZANTAC) 150 MG tablet Take 150 mg by mouth.  . rivaroxaban (XARELTO) 20 MG TABS tablet Take 1 tablet (20 mg total) by mouth daily with supper.  . sucralfate (CARAFATE) 1 g tablet Dissolve 1 tablet in 10 mL H20 and swallow up to QID for sore throat.  . thiamine 100 MG tablet Take 1 tablet (100 mg total) by mouth daily.     Allergies:   Patient has no known allergies.   Past Medical History:  Diagnosis Date  . Aortic stenosis    moderate by 12/16/15 echo  . Atrial fibrillation ([place])   . Cardiomegaly   . CHF (congestive heart failure) ([place])   . History of radiation therapy 10/23/16- 12/11/16   Laryngopharynx and bilateral neck, 70 Gy in 35 fractions.   . Hypertension   . Shortness of breath dyspnea     Past Surgical History:  Procedure Laterality Date  . BRONCHOSCOPY  03/16/2017   performed at [facility]   .  COLONOSCOPY    . DIRECT LARYNGOSCOPY N/A 09/19/2016   Procedure: DIRECT LARYNGOSCOPY with BIOPSY;  Surgeon: Izora Gala, MD;  Location: [place];  Service: ENT;  Laterality: N/A;  . ESOPHAGOSCOPY N/A 09/19/2016   Procedure: ESOPHAGOSCOPY;  Surgeon: Izora Gala, MD;  Location: [place];  Service: ENT;  Laterality: N/A;  . FRACTURE SURGERY Right    right arm  . NECK SURGERY       Social History:  The patient  reports that he quit smoking about 8 months ago. His smoking use included Cigars and Cigarettes. He quit after 10.00 years of use. He has never used smokeless tobacco. He reports that he drinks alcohol. He reports that he does not use drugs.   Family History:  The patient's family history includes Cancer in his father and mother; Stroke in his maternal grandfather.    ROS:  Please see the history of present illness. All other  systems are reviewed and  Negative to the above problem except as noted.    PHYSICAL EXAM: VS:  BP 128/62   Pulse (!) 56   Ht 6\' 1"  (1.854 m)   Wt 198 lb 9.6 oz (90.1 kg)   BMI 26.20 kg/m   GEN: Well nourished, well developed, in no acute distress  HEENT: normal  Neck: no JVD, carotid bruits, or masses Cardiac: RRR; Gr III/Vi mid to later peaking murmur, rubs, or gallops,no edema  Respiratory:  clear to auscultation bilaterally, normal work of breathing GI: soft, nontender, nondistended, + BS  No hepatomegaly  MS: no deformity Moving all extremities   Skin: warm and dry, no rash Neuro:  Strength and sensation are intact Psych: euthymic mood, full affect   EKG:  EKG is ordered today.  Atrial fib  56 bpm LVH with repol abnormalitliy  Lipid Panel No results found for: CHOL, TRIG, HDL, CHOLHDL, VLDL, LDLCALC, LDLDIRECT    Wt Readings from Last 3 Encounters:  05/08/17 198 lb 9.6 oz (90.1 kg)  03/24/17 204 lb 12.8 oz (92.9 kg)  02/11/17 214 lb 1.6 oz (97.1 kg)      ASSESSMENT AND PLAN:  1  Aortic stenosis  WIll arrange for echo to reeval  2  HTN  BP is well controlled  3  Bradycardia  Pt asymtpmatic  4  Atrial fib  Remains on Xarelto    F/U in April   Current medicines are reviewed at length with the patient today.  The patient does not have concerns regarding medicines.  Signed, Dorris Carnes, MD  05/08/2017 9:01 AM    [place], [place], [place]  [address] Phone: [phone]; Fax: [phone]

## 2017-05-05 NOTE — Telephone Encounter (Signed)
**Note De-Identified Stacy Deshler Obfuscation** We received an approval Kevin Martin fax on the pts Xarelto pt assistance through [employer]. Approval good until 05/05/2018.

## 2017-05-08 ENCOUNTER — Ambulatory Visit (INDEPENDENT_AMBULATORY_CARE_PROVIDER_SITE_OTHER): Payer: Medicare Other | Admitting: Internal Medicine

## 2017-05-08 ENCOUNTER — Encounter: Payer: Self-pay | Admitting: Internal Medicine

## 2017-05-08 ENCOUNTER — Encounter (INDEPENDENT_AMBULATORY_CARE_PROVIDER_SITE_OTHER): Payer: Self-pay

## 2017-05-08 VITALS — BP 128/62 | HR 56 | Ht 73.0 in | Wt 198.6 lb

## 2017-05-08 DIAGNOSIS — I350 Nonrheumatic aortic (valve) stenosis: Secondary | ICD-10-CM

## 2017-05-08 DIAGNOSIS — I482 Chronic atrial fibrillation, unspecified: Secondary | ICD-10-CM

## 2017-05-08 DIAGNOSIS — I10 Essential (primary) hypertension: Secondary | ICD-10-CM | POA: Diagnosis not present

## 2017-05-08 NOTE — Patient Instructions (Signed)
Your physician recommends that you continue on your current medications as directed. Please refer to the Current Medication list given to you today.  Your physician has requested that you have an echocardiogram. Echocardiography is a painless test that uses sound waves to create images of your heart. It provides your doctor with information about the size and shape of your heart and how well your heart's chambers and valves are working. This procedure takes approximately one hour. There are no restrictions for this procedure.  Your physician wants you to follow-up in: 6 months with Dr. Harrington Challenger.  You will receive a reminder letter in the mail two months in advance. If you don't receive a letter, please call our office to schedule the follow-up appointment.

## 2017-05-18 ENCOUNTER — Ambulatory Visit: Payer: Medicare Other | Attending: Radiation Oncology

## 2017-05-18 DIAGNOSIS — R1310 Dysphagia, unspecified: Secondary | ICD-10-CM | POA: Insufficient documentation

## 2017-05-18 DIAGNOSIS — R293 Abnormal posture: Secondary | ICD-10-CM | POA: Insufficient documentation

## 2017-05-18 DIAGNOSIS — I890 Lymphedema, not elsewhere classified: Secondary | ICD-10-CM | POA: Insufficient documentation

## 2017-05-18 NOTE — Therapy (Signed)
[facility] [address] [place], [place], [address] Phone: [phone]   Fax:  [phone]  Speech Language Pathology Treatment  Patient Details  Name: Kevin Martin MRN: [id number] Date of Birth: May 31, 1951 Referring Provider: Eppie Gibson, MD  Encounter Date: 05/18/2017      End of Session - 05/18/17 1023    Visit Number 4   Number of Visits 5   Date for SLP Re-Evaluation 05/18/17   SLP Start Time 0941  pt 10 minutes late   SLP Stop Time  1010   SLP Time Calculation (min) 29 min   Activity Tolerance Patient tolerated treatment well      Past Medical History:  Diagnosis Date  . Aortic stenosis    moderate by 12/16/15 echo  . Atrial fibrillation ([place])   . Cardiomegaly   . CHF (congestive heart failure) ([place])   . History of radiation therapy 10/23/16- 12/11/16   Laryngopharynx and bilateral neck, 70 Gy in 35 fractions.   . Hypertension   . Shortness of breath dyspnea     Past Surgical History:  Procedure Laterality Date  . BRONCHOSCOPY  03/16/2017   performed at [facility]   . COLONOSCOPY    . DIRECT LARYNGOSCOPY N/A 09/19/2016   Procedure: DIRECT LARYNGOSCOPY with BIOPSY;  Surgeon: Izora Gala, MD;  Location: [facility];  Service: ENT;  Laterality: N/A;  . ESOPHAGOSCOPY N/A 09/19/2016   Procedure: ESOPHAGOSCOPY;  Surgeon: Izora Gala, MD;  Location: [facility];  Service: ENT;  Laterality: N/A;  . FRACTURE SURGERY Right    right arm  . NECK SURGERY      There were no vitals filed for this visit.      Subjective Assessment - 05/18/17 0943    Subjective "I go every three weeks to [facility] for the cancer."               ADULT SLP TREATMENT - 05/18/17 0944      General Information   Behavior/Cognition Alert;Cooperative;Pleasant mood     Treatment Provided   Treatment provided Dysphagia     Dysphagia Treatment   Temperature Spikes Noted No   Respiratory Status Room air   Treatment Methods Skilled  observation;Therapeutic exercise   Patient observed directly with PO's Yes   Type of PO's observed Thin liquids;Dysphagia 3 (soft)   Liquids provided via Cup   Oral Phase Signs & Symptoms --  none noted   Pharyngeal Phase Signs & Symptoms --  none noted   Other treatment/comments Pt with newly diagnosed lung cancer. since last seen SLP Having ageusia due to chemo. "I lost that paper but I'm still doing them." Pt was modified independnet (looking at HEP) today with HEP, and had not overt s/s aspiration during dys III/thin trials today. Pt reports he had roast, creamed potatoes, and vegetables last night without any issues with pharyngeal clearance. Pt with significant lymphedema and he reports this "keeps (him) up at night." Suggest referral to cancer PT.     Assessment / Recommendations / Plan   Plan Continue with current plan of care     Dysphagia Recommendations   Diet recommendations Thin liquid;Dysphagia 3 (mechanical soft)   Liquids provided via Cup   Medication Administration Whole meds with liquid     Progression Toward Goals   Progression toward goals --  d/c - see goal update            SLP Short Term Goals - 12/15/16 1435  SLP SHORT TERM GOAL #1   Title pt will complete HEP with rare min A   Status Achieved     SLP SHORT TERM GOAL #2   Title pt will tell SLP why  he is completing HEP   Status Achieved     SLP SHORT TERM GOAL #3   Title pt will tell SLP foods he could attempt to begin to return to WNL variety of food textures   Status Deferred          SLP Long Term Goals - 06-03-2017 1027      SLP LONG TERM GOAL #1   Title pt will complete HEP with modified independnce over two visits   Status Achieved     SLP LONG TERM GOAL #2   Title pt will tell SLP 3 overt s/s aspiration PNA with modified independence   Status Achieved     SLP LONG TERM GOAL #3   Title pt will tell SLP how long he must complete HEP >x4/week (6 monhts after last day rad tx)    Status Achieved          Plan - 06/03/2017 1024    Clinical Impression Statement Pt cont to present with swallowing WFL/WNL, wihtout overt s/s aspiration during POs, and without overt s/s aspiration PNA. Pt ws seen today with current goals enforced. See goal update for details. Pt cont to report his PO intake has mainly to do with lack of taste. Despite admitting to not completing his HEP  much in the last month or so, pt did excellently with HEP today (modified independent). Water and [place] sandwich were swallowed today without overt s/s aspiration. He will be d/c'd today as safety with POs contintues as WNL, correct completion of HEP is observed today.   Duration --  1 visit - today   Treatment/Interventions Aspiration precaution training;Pharyngeal strengthening exercises;Diet toleration management by SLP;Compensatory techniques;Trials of upgraded texture/liquids;Cueing hierarchy;Patient/family education;Oral motor exercises;SLP instruction and feedback   Potential to Achieve Goals Good      Patient will benefit from skilled therapeutic intervention in order to improve the following deficits and impairments:   Dysphagia, unspecified type      G-Codes - 06-03-17 1028    Functional Assessment Tool Used noms -7   Functional Limitations Swallowing   Swallow Goal Status (F8101) At least 1 percent but less than 20 percent impaired, limited or restricted   Swallow Discharge Status [phone]) 0 percent impaired, limited or restricted      SPEECH THERAPY RENEWAL/DISCHARGE SUMMARY  Visits from Start of Care: 4  Current functional level related to goals / functional outcomes: Pt completion of HEP remains WNL, even over 5 months since last ST visit. Pt without overt s/s aspiration with POs today and no s/s of aspiration PNA.   Pt was seen today with LTGs enforced and all were met today. Pt will be formally d/c'd from [facility].    Remaining deficits: None noted today. Pt has significant lymphedema  and suggestion for referral to cancer PT was made to ST-referring MD.   Education / Equipment: HEP, late effects head/neck radiation on swallowing  Plan: Patient agrees to discharge.  Patient goals were met. Patient is being discharged due to meeting the stated rehab goals.  ?????       Problem List Patient Active Problem List   Diagnosis Date Noted  . 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 Goals of care, counseling/discussion 02/12/2017  . Multiple lung nodules on CT 10/10/2016  .  Alcoholism ([place]) 10/10/2016  . 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 Chronic atrial fibrillation ([facility]) 02/14/2015  . Chronic diastolic CHF (congestive heart failure) ([place]) 02/14/2015  . Chronic diastolic heart failure ([place]) 02/14/2015  . Prediabetes 01/23/2015  . Tobacco abuse 01/23/2015  . Acute on chronic congestive heart failure ([place]) 01/15/2015  . Acute congestive heart failure with left ventricular diastolic dysfunction ([place]) 01/14/2015  . Alcohol abuse, daily use 01/14/2015  . Acute respiratory failure ([place]) 01/14/2015  . Acute hyponatremia 01/14/2015  . Acute right heart failure ([place]) 01/14/2015  . Congestive heart disease ([place])   . Right heart failure ([place]) 01/03/2015  . Cardiomegaly 01/03/2015  . Atrial fibrillation, controlled ([place]) 01/03/2015  . Moderate aortic stenosis 01/03/2015  . Chronic anticoagulation 01/03/2015  . Hypertension 01/03/2015  . Dyspnea 01/03/2015  . A-fib ([place]) 02/17/2014  . Long term current use of anticoagulant 02/17/2014  . Essential (primary) hypertension 11/01/2013  . Adult BMI 30+ 10/21/2013  . Adiposity 04/22/2013  . H/O cardiovascular disorder 04/22/2013  . Headache, tension-type 02/05/2013    [facility] 05/18/2017, 10:29 AM  [facility] [address] [place], [place], [address] Phone: [phone]   Fax:  [phone]   Name: RANVIR RENOVATO MRN: [id number] Date of Birth: [birth year]

## 2017-05-18 NOTE — Patient Instructions (Signed)
You have to do the exercises twice a day to keep your muscles in the throat from becoming hard and your swallowing deteriorate. At [facility], decrease to doing them twice a week.

## 2017-05-18 NOTE — Addendum Note (Signed)
Addended by: Garald Balding B on: 05/18/2017 10:34 AM   Modules accepted: Orders

## 2017-05-19 ENCOUNTER — Other Ambulatory Visit: Payer: Self-pay | Admitting: Radiation Oncology

## 2017-05-19 DIAGNOSIS — C321 Malignant neoplasm of supraglottis: Secondary | ICD-10-CM

## 2017-05-20 ENCOUNTER — Ambulatory Visit: Payer: Medicare Other | Admitting: Physical Therapy

## 2017-05-20 DIAGNOSIS — I890 Lymphedema, not elsewhere classified: Secondary | ICD-10-CM

## 2017-05-20 DIAGNOSIS — R1310 Dysphagia, unspecified: Secondary | ICD-10-CM | POA: Diagnosis not present

## 2017-05-20 DIAGNOSIS — R293 Abnormal posture: Secondary | ICD-10-CM

## 2017-05-20 NOTE — Therapy (Signed)
[facility], [place], [address] Phone: [phone]   Fax:  [phone]  Physical Therapy Treatment  Patient Details  Name: Kevin Martin MRN: [id number] Date of Birth: [birth year] Referring Provider: Dr. Eppie Gibson  Encounter Date: 05/20/2017      PT End of Session - 05/20/17 1713    Visit Number 1   Number of Visits 9   Date for PT Re-Evaluation 07/01/17   PT Start Time 1020  no eval code is correct for today   PT Stop Time 1103   PT Time Calculation (min) 43 min   Activity Tolerance Patient tolerated treatment well   Behavior During Therapy [facility] for tasks assessed/performed      Past Medical History:  Diagnosis Date  . Aortic stenosis    moderate by 12/16/15 echo  . Atrial fibrillation ([place])   . Cardiomegaly   . CHF (congestive heart failure) ([place])   . History of radiation therapy 10/23/16- 12/11/16   Laryngopharynx and bilateral neck, 70 Gy in 35 fractions.   . Hypertension   . Shortness of breath dyspnea     Past Surgical History:  Procedure Laterality Date  . BRONCHOSCOPY  03/16/2017   performed at [facility]   . COLONOSCOPY    . DIRECT LARYNGOSCOPY N/A 09/19/2016   Procedure: DIRECT LARYNGOSCOPY with BIOPSY;  Surgeon: Izora Gala, MD;  Location: [facility];  Service: ENT;  Laterality: N/A;  . ESOPHAGOSCOPY N/A 09/19/2016   Procedure: ESOPHAGOSCOPY;  Surgeon: Izora Gala, MD;  Location: [facility];  Service: ENT;  Laterality: N/A;  . FRACTURE SURGERY Right    right arm  . NECK SURGERY      There were no vitals filed for this visit.      Subjective Assessment - 05/20/17 1027    Subjective Significant other doesn't have time to do the massage. Doctors told him his neck swelling should be better.  "It's really bad at night." The cancer's all gone from my neck; of course they found it on my lung." Swallowing is intermittently worse, especially at night. Did try the foam chip pack for compression, but  hasn't used it lately as he finds it very uncomfortable.   Pertinent History Pt. with supraglottic invasive squamous cell carcinoma diagnosed 09/19/16.  Plans to have XRT only; no chemo due to h/o cardiac problems.  Will start XRT 10/23/16.  CHF, atrial fibrillation, cardiomegaly, HTN. Finished treatment at [facility] of XRT 12/11/16. Is now going to [facility] and getting chemo there once every three weeks; started that about a month ago. Going to [facility] because of cancer (possibly metastases) on his lungs.   Currently in Pain? Yes   Pain Score 4    Pain Location Throat   Pain Descriptors / Indicators Sore   Aggravating Factors  worse at night   Pain Relieving Factors drinking               LYMPHEDEMA/ONCOLOGY QUESTIONNAIRE - 05/20/17 1031      Head and Neck   4 cm superior to sternal notch around neck 50.5 cm   6 cm superior to sternal notch around neck 51 cm   8 cm superior to sternal notch around neck 52.5 cm   Other May have gained a couple pounds, but it goes up and down.                     [facility] Adult PT Treatment/Exercise - 05/20/17 0001  Self-Care   Other Self-Care Comments  fitted with donated neck compression garment and issued it, instructing patient in its use     Manual Therapy   Manual Lymphatic Drainage (MLD) in supine with head of bed elevated:  diaphragmatic breathing, supraclavicular fossae, bilat. axillae, bilat. shoulder collectors; posterolateral, lateral, anterolateral, and anterior neck; chin, cheeks and preauricular areas; then retraced steps with pathway down to axillae                PT Education - 05/20/17 1712    Education provided Yes   Education Details about wearing manufactured compression garment he was given at home for 2-4 hours a day and then if able, to try wearing it at night.   Person(s) Educated Patient   Methods Explanation   Comprehension Verbalized understanding                Long Term Clinic Goals - 05/20/17  1718      CC Long Term Goal  #1   Title Pt. will be knowledgeable about performing manual lymph drainage on himself.   Time 4   Period Weeks   Status New   Target Date 07/01/17     CC Long Term Goal  #2   Title Neck circumference measurement at 4 cm. superior to sternal notch will be reduced by at least 1.5 cm. to 49 cm. or less.   Baseline 50.5 cm. at eval   Time 4   Period Weeks   Status New   Target Date 07/01/17     CC Long Term Goal  #3   Title Neck circumference measurement at 8 cm. superior to sternal notch will reduce by at least 1.5 cm. to 51 cm. or less.   Baseline 52.5 cm. on eval   Time 4   Period Weeks   Status New   Target Date 07/01/17         Head and Neck Clinic Goals - 10/14/16 1049      Patient will be able to verbalize understanding of a home exercise program for cervical range of motion, posture, and walking.    Status Achieved     Patient will be able to verbalize understanding of proper sitting and standing posture.    Status Achieved     Patient will be able to verbalize understanding of lymphedema risk and availability of treatment for this condition.    Status Achieved           Plan - 05/20/17 1713    Clinical Impression Statement Pt. known to this clinic. He was here two months ago, wanting just one session to have his significant other learn manual lymph drainage to do on him and limit his treatment to that.  Now he comes in with increased swelling that is evident both visually and with circumference measurements.  He says [name] doesn't have time to do the massage, and that when she does it she presses too hard.  He tried the foam chip pack for compression but did not find it comfortable so has not worn it recently.  No eval charge today, as patient's chart was not closed after last visit in late July.   Rehab Potential Good   Clinical Impairments Affecting Rehab Potential hx of radiation   PT Frequency 2x / week   PT  Duration 4 weeks   PT Treatment/Interventions ADLs/Self Care Home Management;DME Instruction;Patient/family education;Orthotic Fit/Training;Manual techniques;Manual lymph drainage;Compression bandaging;Taping;Therapeutic exercise;Passive range of motion   PT Next Visit Plan  Continue manual lymph drainage and consider teaching patient to do it himself.  Check on how he is tolerating the neck compression garment he was given today. Consider adding foam to that if appropriate.   PT Home Exercise Plan use compression garment   Consulted and Agree with Plan of Care Patient      Patient will benefit from skilled therapeutic intervention in order to improve the following deficits and impairments:  Increased edema, Decreased knowledge of use of DME, Decreased knowledge of precautions  Visit Diagnosis: Lymphedema, not elsewhere classified - Plan: PT plan of care cert/re-cert  Abnormal posture - Plan: PT plan of care cert/re-cert       G-Codes - 05/30/2017 1721    Functional Assessment Tool Used (Outpatient Only) clinical judgement   Functional Limitation Self care   Self Care Current Status (Y3338) At least 60 percent but less than 80 percent impaired, limited or restricted   Self Care Goal Status (V2919) At least 1 percent but less than 20 percent impaired, limited or restricted      Problem List Patient Active Problem List   Diagnosis Date Noted  . 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 Goals of care, counseling/discussion 02/12/2017  . Multiple lung nodules on CT 10/10/2016  . Alcoholism ([place]) 10/10/2016  . 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 Chronic atrial fibrillation ([place]) 02/14/2015  . Chronic diastolic CHF (congestive heart failure) ([place]) 02/14/2015  . Chronic diastolic heart failure ([place]) 02/14/2015  . Prediabetes 01/23/2015  . Tobacco abuse 01/23/2015  . Acute on chronic congestive heart failure ([place]) 01/15/2015  . Acute congestive heart failure with left ventricular  diastolic dysfunction ([place]) 01/14/2015  . Alcohol abuse, daily use 01/14/2015  . Acute respiratory failure ([place]) 01/14/2015  . Acute hyponatremia 01/14/2015  . Acute right heart failure ([place]) 01/14/2015  . Congestive heart disease ([place])   . Right heart failure ([place]) 01/03/2015  . Cardiomegaly 01/03/2015  . Atrial fibrillation, controlled ([place]) 01/03/2015  . Moderate aortic stenosis 01/03/2015  . Chronic anticoagulation 01/03/2015  . Hypertension 01/03/2015  . Dyspnea 01/03/2015  . A-fib ([place]) 02/17/2014  . Long term current use of anticoagulant 02/17/2014  . Essential (primary) hypertension 11/01/2013  . Adult BMI 30+ 10/21/2013  . Adiposity 04/22/2013  . H/O cardiovascular disorder 04/22/2013  . Headache, tension-type 02/05/2013    Martin,Kevin 05/30/2017, 5:25 PM  [facility] [place], [place], [address] Phone: [phone]   Fax:  [phone]  Name: Kevin Martin MRN: [id number] Date of Birth: 07/07/1951  Serafina Royals, PT 05/30/17 5:25 PM

## 2017-05-27 ENCOUNTER — Ambulatory Visit: Payer: Medicare Other | Admitting: Physical Therapy

## 2017-05-27 DIAGNOSIS — R1310 Dysphagia, unspecified: Secondary | ICD-10-CM | POA: Diagnosis not present

## 2017-05-27 DIAGNOSIS — I890 Lymphedema, not elsewhere classified: Secondary | ICD-10-CM

## 2017-05-27 NOTE — Therapy (Signed)
[place], [place], [address] Phone: [phone]   Fax:  [phone]  Physical Therapy Treatment  Patient Details  Name: Kevin Martin MRN: [id number] Date of Birth: [birth year] Referring Provider: Dr. Eppie Gibson  Encounter Date: 05/27/2017      PT End of Session - 05/27/17 1211    Visit Number 2   Number of Visits 9   Date for PT Re-Evaluation 07/01/17   PT Start Time 0937   PT Stop Time 1015   PT Time Calculation (min) 38 min   Activity Tolerance Patient tolerated treatment well   Behavior During Therapy [facility] for tasks assessed/performed      Past Medical History:  Diagnosis Date  . Aortic stenosis    moderate by 12/16/15 echo  . Atrial fibrillation ([place])   . Cardiomegaly   . CHF (congestive heart failure) ([place])   . History of radiation therapy 10/23/16- 12/11/16   Laryngopharynx and bilateral neck, 70 Gy in 35 fractions.   . Hypertension   . Shortness of breath dyspnea     Past Surgical History:  Procedure Laterality Date  . BRONCHOSCOPY  03/16/2017   performed at [facility]   . COLONOSCOPY    . DIRECT LARYNGOSCOPY N/A 09/19/2016   Procedure: DIRECT LARYNGOSCOPY with BIOPSY;  Surgeon: Izora Gala, MD;  Location: [place];  Service: ENT;  Laterality: N/A;  . ESOPHAGOSCOPY N/A 09/19/2016   Procedure: ESOPHAGOSCOPY;  Surgeon: Izora Gala, MD;  Location: [place];  Service: ENT;  Laterality: N/A;  . FRACTURE SURGERY Right    right arm  . NECK SURGERY      There were no vitals filed for this visit.      Subjective Assessment - 05/27/17 0939    Subjective Nothing new today. I feel like it went down some.  I've been wearing that mask and doing the massage.   Pertinent History Pt. with supraglottic invasive squamous cell carcinoma diagnosed 09/19/16.  Plans to have XRT only; no chemo due to h/o cardiac problems.  Will start XRT 10/23/16.  CHF, atrial fibrillation, cardiomegaly, HTN. Finished treatment at  [facility] of XRT 12/11/16. Is now going to [facility] and getting chemo there once every three weeks; started that about a month ago. Going to [facility] because of cancer (possibly metastases) on his lungs.   Currently in Pain? Yes   Pain Score 6    Pain Location Throat   Pain Descriptors / Indicators Sore   Aggravating Factors  worse at night   Pain Relieving Factors drinking something               LYMPHEDEMA/ONCOLOGY QUESTIONNAIRE - 05/27/17 1010      Head and Neck   4 cm superior to sternal notch around neck 49.4 cm   6 cm superior to sternal notch around neck 49.9 cm   8 cm superior to sternal notch around neck 50.9 cm                  [facility] Adult PT Treatment/Exercise - 05/27/17 0001      Manual Therapy   Manual Therapy Edema management   Edema Management circumference measurements taken   Manual Lymphatic Drainage (MLD) in supine with head of bed elevated:  diaphragmatic breathing, supraclavicular fossae, bilat. axillae, bilat. shoulder collectors; posterolateral, lateral, anterolateral, and anterior neck; chin, cheeks and preauricular areas; then retraced steps with pathway down to axillae; at end, with patient sitting, worked anterior and posterior chest, shoulders, axillae,  pectoral nodes                        Long Term Clinic Goals - 05/20/17 1718      CC Long Term Goal  #1   Title Pt. will be knowledgeable about performing manual lymph drainage on himself.   Time 4   Period Weeks   Status New   Target Date 07/01/17     CC Long Term Goal  #2   Title Neck circumference measurement at 4 cm. superior to sternal notch will be reduced by at least 1.5 cm. to 49 cm. or less.   Baseline 50.5 cm. at eval   Time 4   Period Weeks   Status New   Target Date 07/01/17     CC Long Term Goal  #3   Title Neck circumference measurement at 8 cm. superior to sternal notch will reduce by at least 1.5 cm. to 51 cm. or less.   Baseline 52.5 cm. on eval   Time 4    Period Weeks   Status New   Target Date 07/01/17         Head and Neck Clinic Goals - 10/14/16 1049      Patient will be able to verbalize understanding of a home exercise program for cervical range of motion, posture, and walking.    Status Achieved     Patient will be able to verbalize understanding of proper sitting and standing posture.    Status Achieved     Patient will be able to verbalize understanding of lymphedema risk and availability of treatment for this condition.    Status Achieved           Plan - 05/27/17 1211    Clinical Impression Statement Pt. reports he has been wearing the compression garment he was given here.  He also does some of his own "massage," though he doesn't do the whole sequence, but mosly works at the neck. He had a nice reduction in measurements already today.   Rehab Potential Good   Clinical Impairments Affecting Rehab Potential hx of radiation   PT Frequency 2x / week   PT Duration 4 weeks   PT Treatment/Interventions ADLs/Self Care Home Management;DME Instruction;Patient/family education;Orthotic Fit/Training;Manual techniques;Manual lymph drainage;Compression bandaging;Taping;Therapeutic exercise;Passive range of motion   PT Next Visit Plan Teach patient self-manual lymph drainage; continue that in clinic as well.    PT Home Exercise Plan use compression garment   Consulted and Agree with Plan of Care Patient      Patient will benefit from skilled therapeutic intervention in order to improve the following deficits and impairments:  Increased edema, Decreased knowledge of use of DME, Decreased knowledge of precautions  Visit Diagnosis: Lymphedema, not elsewhere classified     Problem List Patient Active Problem List   Diagnosis Date Noted  . 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 Goals of care, counseling/discussion 02/12/2017  . Multiple lung nodules on CT 10/10/2016  . Alcoholism ([place]) 10/10/2016  . Cancer  of supraglottis ([place]) 10/08/2016  . Chronic atrial fibrillation ([place]) 02/14/2015  . Chronic diastolic CHF (congestive heart failure) ([place]) 02/14/2015  . Chronic diastolic heart failure ([place]) 02/14/2015  . Prediabetes 01/23/2015  . Tobacco abuse 01/23/2015  . Acute on chronic congestive heart failure ([place]) 01/15/2015  . Acute congestive heart failure with left ventricular diastolic dysfunction ([place]) 01/14/2015  . Alcohol abuse, daily use 01/14/2015  . Acute respiratory failure ([place]) 01/14/2015  .  Acute hyponatremia 01/14/2015  . Acute right heart failure ([place]) 01/14/2015  . Congestive heart disease ([place])   . Right heart failure ([place]) 01/03/2015  . Cardiomegaly 01/03/2015  . Atrial fibrillation, controlled ([place]) 01/03/2015  . Moderate aortic stenosis 01/03/2015  . Chronic anticoagulation 01/03/2015  . Hypertension 01/03/2015  . Dyspnea 01/03/2015  . A-fib ([place]) 02/17/2014  . Long term current use of anticoagulant 02/17/2014  . Essential (primary) hypertension 11/01/2013  . Adult BMI 30+ 10/21/2013  . Adiposity 04/22/2013  . H/O cardiovascular disorder 04/22/2013  . Headache, tension-type 02/05/2013    SALISBURY,DONNA 05/27/2017, 12:14 PM  [place], [place], [address] Phone: [phone]   Fax:  [phone]  Name: Kevin Martin MRN: [id number] Date of Birth: 09/20/1950  Serafina Royals, PT 05/27/17 12:14 PM

## 2017-05-29 ENCOUNTER — Ambulatory Visit (HOSPITAL_COMMUNITY): Payer: Medicare Other | Attending: Cardiovascular Disease

## 2017-05-29 ENCOUNTER — Other Ambulatory Visit: Payer: Self-pay

## 2017-05-29 DIAGNOSIS — I350 Nonrheumatic aortic (valve) stenosis: Secondary | ICD-10-CM | POA: Insufficient documentation

## 2017-05-29 DIAGNOSIS — I352 Nonrheumatic aortic (valve) stenosis with insufficiency: Secondary | ICD-10-CM | POA: Diagnosis not present

## 2017-05-29 DIAGNOSIS — I509 Heart failure, unspecified: Secondary | ICD-10-CM | POA: Diagnosis not present

## 2017-05-29 DIAGNOSIS — I517 Cardiomegaly: Secondary | ICD-10-CM | POA: Insufficient documentation

## 2017-05-29 DIAGNOSIS — Q211 Atrial septal defect: Secondary | ICD-10-CM | POA: Diagnosis not present

## 2017-06-02 ENCOUNTER — Ambulatory Visit: Payer: Medicare Other | Attending: Radiation Oncology | Admitting: Physical Therapy

## 2017-06-02 DIAGNOSIS — I890 Lymphedema, not elsewhere classified: Secondary | ICD-10-CM | POA: Insufficient documentation

## 2017-06-02 DIAGNOSIS — R293 Abnormal posture: Secondary | ICD-10-CM | POA: Diagnosis present

## 2017-06-02 NOTE — Therapy (Signed)
[facility] [address] [place], [place], [address] Phone: [phone]   Fax:  [phone]  Physical Therapy Treatment  Patient Details  Name: Kevin Martin MRN: [id number] Date of Birth: September 14, 1950 Referring Provider: Dr. Lonie Peak  Encounter Date: 06/02/2017      PT End of Session - 06/02/17 1617    Visit Number 3   Number of Visits 9   Date for PT Re-Evaluation 07/01/17   PT Start Time 1435   PT Stop Time 1515   PT Time Calculation (min) 40 min   Activity Tolerance Patient tolerated treatment well   Behavior During Therapy [facility] for tasks assessed/performed      Past Medical History:  Diagnosis Date  . Aortic stenosis    moderate by 12/16/15 echo  . Atrial fibrillation ([facility])   . Cardiomegaly   . CHF (congestive heart failure) ([facility])   . History of radiation therapy 10/23/16- 12/11/16   Laryngopharynx and bilateral neck, 70 Gy in 35 fractions.   . Hypertension   . Shortness of breath dyspnea     Past Surgical History:  Procedure Laterality Date  . BRONCHOSCOPY  03/16/2017   performed at [facility]   . COLONOSCOPY    . DIRECT LARYNGOSCOPY N/A 09/19/2016   Procedure: DIRECT LARYNGOSCOPY with BIOPSY;  Surgeon: Serena Colonel, MD;  Location: [facility] OR;  Service: ENT;  Laterality: N/A;  . ESOPHAGOSCOPY N/A 09/19/2016   Procedure: ESOPHAGOSCOPY;  Surgeon: Serena Colonel, MD;  Location: [facility] OR;  Service: ENT;  Laterality: N/A;  . FRACTURE SURGERY Right    right arm  . NECK SURGERY      There were no vitals filed for this visit.      Subjective Assessment - 06/02/17 1438    Subjective Says he's been wearing the garment and doing some ofthe massage.   Currently in Pain? Yes   Pain Score 2    Pain Location Throat   Aggravating Factors  worse at night   Pain Relieving Factors drinking something               LYMPHEDEMA/ONCOLOGY QUESTIONNAIRE - 06/02/17 1512      Head and Neck   4 cm superior to sternal notch around  neck 49 cm   6 cm superior to sternal notch around neck 49.9 cm   8 cm superior to sternal notch around neck 50.9 cm                  [facility] Adult PT Treatment/Exercise - 06/02/17 0001      Manual Therapy   Manual Lymphatic Drainage (MLD) Reviewed self manual lymph drainage with patient sitting in chair in front of mirror, beginning with diaphragmatic breathing, then with verbal, demonstration and tactile cueing for patient to perform steps as per home instruction given previously; then in supine with head of bed elevated:  supraclavicular fossae, bilat. axillae, bilat. shoulder collectors; posterolateral, lateral, anterolateral, and anterior neck; chin, cheeks and preauricular areas; then retraced steps with pathway down to axillae                        Long Term Clinic Goals - 06/02/17 1512      CC Long Term Goal  #1   Title Pt. will be knowledgeable about performing manual lymph drainage on himself.   Status Achieved     CC Long Term Goal  #2   Title Neck circumference measurement at 4 cm. superior to  sternal notch will be reduced by at least 1.5 cm. to 49 cm. or less.   Baseline 50.5 cm. at eval; 49 on 06/02/17   Status Achieved     CC Long Term Goal  #3   Title Neck circumference measurement at 8 cm. superior to sternal notch will reduce by at least 1.5 cm. to 51 cm. or less.   Baseline 52.5 cm. on eval; 50.9 on 06/02/17   Status Achieved     CC Long Term Goal  #4   Title Neck circumference at 4 cm superior to sternal notch reduced to 48.5 or less   Baseline 49 on 06/02/17 when goal was written   Time 2   Period Weeks   Status New     CC Long Term Goal  #5   Title Neck circumference at 8 cm. superior to sternal notch reduced to 50.5 cm. or less   Baseline 50.9 when goal written on 06/02/17   Time 2   Period Weeks   Status New         Head and Neck Clinic Goals - 10/14/16 1049      Patient will be able to verbalize understanding of a home  exercise program for cervical range of motion, posture, and walking.    Status Achieved     Patient will be able to verbalize understanding of proper sitting and standing posture.    Status Achieved     Patient will be able to verbalize understanding of lymphedema risk and availability of treatment for this condition.    Status Achieved           Plan - 06/02/17 1617    Clinical Impression Statement Pt. did well with self-manual lymph drainage when given cues today.  He benefitted from review but should be able to perform this at home if he will do all steps. He has met goals as of today, so new goals will be written.  He says his compression garment is pretty snug, so we may not try foam in it as this therapist had considered doing.   Rehab Potential Good   Clinical Impairments Affecting Rehab Potential hx of radiation   PT Frequency 2x / week   PT Duration 4 weeks   PT Treatment/Interventions ADLs/Self Care Home Management;DME Instruction;Patient/family education;Orthotic Fit/Training;Manual techniques;Manual lymph drainage;Compression bandaging;Taping;Therapeutic exercise;Passive range of motion   PT Next Visit Plan Continue manual lymph drainage and monitoring neck circumference measurements.   PT Home Exercise Plan use compression garment, do self-manual lymph drainage   Consulted and Agree with Plan of Care Patient      Patient will benefit from skilled therapeutic intervention in order to improve the following deficits and impairments:  Increased edema, Decreased knowledge of use of DME, Decreased knowledge of precautions  Visit Diagnosis: Lymphedema, not elsewhere classified  Abnormal posture     Problem List Patient Active Problem List   Diagnosis Date Noted  . 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 Goals of care, counseling/discussion 02/12/2017  . Multiple lung nodules on CT 10/10/2016  . Alcoholism ([place]) 10/10/2016  . Cancer of supraglottis  ([place]) 10/08/2016  . Chronic atrial fibrillation ([place]) 02/14/2015  . Chronic diastolic CHF (congestive heart failure) ([place]) 02/14/2015  . Chronic diastolic heart failure ([place]) 02/14/2015  . Prediabetes 01/23/2015  . Tobacco abuse 01/23/2015  . Acute on chronic congestive heart failure ([place]) 01/15/2015  . Acute congestive heart failure with left ventricular diastolic dysfunction ([place]) 01/14/2015  . Alcohol abuse, daily use  01/14/2015  . Acute respiratory failure ([place]) 01/14/2015  . Acute hyponatremia 01/14/2015  . Acute right heart failure ([place]) 01/14/2015  . Congestive heart disease ([place])   . Right heart failure ([place]) 01/03/2015  . Cardiomegaly 01/03/2015  . Atrial fibrillation, controlled ([place]) 01/03/2015  . Moderate aortic stenosis 01/03/2015  . Chronic anticoagulation 01/03/2015  . Hypertension 01/03/2015  . Dyspnea 01/03/2015  . A-fib ([place]) 02/17/2014  . Long term current use of anticoagulant 02/17/2014  . Essential (primary) hypertension 11/01/2013  . Adult BMI 30+ 10/21/2013  . Adiposity 04/22/2013  . H/O cardiovascular disorder 04/22/2013  . Headache, tension-type 02/05/2013    [place] 06/02/2017, 5:26 PM  [place], [place], [address] Phone: [phone]   Fax:  [phone]  Name: Kevin Martin MRN: [id number] Date of Birth: September 24, 1950  Serafina Royals, PT 06/02/17 5:26 PM

## 2017-06-05 ENCOUNTER — Ambulatory Visit: Payer: Medicare Other | Admitting: Physical Therapy

## 2017-06-05 DIAGNOSIS — I890 Lymphedema, not elsewhere classified: Secondary | ICD-10-CM

## 2017-06-05 NOTE — Therapy (Signed)
[place], [place], [address] Phone: [phone]   Fax:  [phone]  Physical Therapy Treatment  Patient Details  Name: Kevin Martin MRN: [id number] Date of Birth: August 10, 1951 Referring Provider: Dr. Eppie Gibson  Encounter Date: 06/05/2017      PT End of Session - 06/05/17 1024    Visit Number 4   Number of Visits 9   Date for PT Re-Evaluation 07/01/17   PT Start Time 0935   PT Stop Time 1013   PT Time Calculation (min) 38 min   Activity Tolerance Patient tolerated treatment well   Behavior During Therapy [facility] for tasks assessed/performed      Past Medical History:  Diagnosis Date  . Aortic stenosis    moderate by 12/16/15 echo  . Atrial fibrillation ([place])   . Cardiomegaly   . CHF (congestive heart failure) ([place])   . History of radiation therapy 10/23/16- 12/11/16   Laryngopharynx and bilateral neck, 70 Gy in 35 fractions.   . Hypertension   . Shortness of breath dyspnea     Past Surgical History:  Procedure Laterality Date  . BRONCHOSCOPY  03/16/2017   performed at [facility]   . COLONOSCOPY    . DIRECT LARYNGOSCOPY N/A 09/19/2016   Procedure: DIRECT LARYNGOSCOPY with BIOPSY;  Surgeon: Izora Gala, MD;  Location: [place];  Service: ENT;  Laterality: N/A;  . ESOPHAGOSCOPY N/A 09/19/2016   Procedure: ESOPHAGOSCOPY;  Surgeon: Izora Gala, MD;  Location: [place];  Service: ENT;  Laterality: N/A;  . FRACTURE SURGERY Right    right arm  . NECK SURGERY      There were no vitals filed for this visit.      Subjective Assessment - 06/05/17 0936    Subjective Went to [facility] for treatment and had a scan.  Was told that the cancer has shrunk.   Currently in Pain? Yes   Pain Score 1    Pain Location Throat   Pain Descriptors / Indicators Sore                         [facility] Adult PT Treatment/Exercise - 06/05/17 0001      Manual Therapy   Manual Lymphatic Drainage (MLD) in supine with  head of bed elevated:  diaphragmatic breathing, supraclavicular fossae, bilat. axillae, bilat. shoulder collectors; posterolateral, lateral, anterolateral, and anterior neck; chin, cheeks and preauricular areas; then retraced steps with pathway down to axillae; at end, with patient sitting, worked anterior and posterior chest, shoulders, axillae, pectoral nodes                        Long Term Clinic Goals - 06/02/17 1512      [place] Term Goal  #1   Title Pt. will be knowledgeable about performing manual lymph drainage on himself.   Status Achieved     CC Long Term Goal  #2   Title Neck circumference measurement at 4 cm. superior to sternal notch will be reduced by at least 1.5 cm. to 49 cm. or less.   Baseline 50.5 cm. at eval; 49 on 06/02/17   Status Achieved     CC Long Term Goal  #3   Title Neck circumference measurement at 8 cm. superior to sternal notch will reduce by at least 1.5 cm. to 51 cm. or less.   Baseline 52.5 cm. on eval; 50.9 on 06/02/17   Status Achieved  CC Long Term Goal  #4   Title Neck circumference at 4 cm superior to sternal notch reduced to 48.5 or less   Baseline 49 on 06/02/17 when goal was written   Time 2   Period Weeks   Status New     CC Long Term Goal  #5   Title Neck circumference at 8 cm. superior to sternal notch reduced to 50.5 cm. or less   Baseline 50.9 when goal written on 06/02/17   Time 2   Period Weeks   Status New         Head and Neck Clinic Goals - 10/14/16 1049      Patient will be able to verbalize understanding of a home exercise program for cervical range of motion, posture, and walking.    Status Achieved     Patient will be able to verbalize understanding of proper sitting and standing posture.    Status Achieved     Patient will be able to verbalize understanding of lymphedema risk and availability of treatment for this condition.    Status Achieved           Plan - 06/05/17 1024     Clinical Impression Statement Pt. came in with neck looking full, and although measurements were not taken, it appeared to have reduced by end of session which focused on  manual lymph drainage.   Rehab Potential Good   Clinical Impairments Affecting Rehab Potential hx of radiation   PT Frequency 2x / week   PT Duration 4 weeks   PT Treatment/Interventions ADLs/Self Care Home Management;DME Instruction;Patient/family education;Orthotic Fit/Training;Manual techniques;Manual lymph drainage;Compression bandaging;Taping;Therapeutic exercise;Passive range of motion   PT Next Visit Plan Continue manual lymph drainage and monitoring neck circumference measurements.   PT Home Exercise Plan use compression garment, do self-manual lymph drainage   Consulted and Agree with Plan of Care Patient      Patient will benefit from skilled therapeutic intervention in order to improve the following deficits and impairments:  Increased edema, Decreased knowledge of use of DME, Decreased knowledge of precautions  Visit Diagnosis: Lymphedema, not elsewhere classified     Problem List Patient Active Problem List   Diagnosis Date Noted  . Metastasis to lung ([place]) 02/12/2017  . Goals of care, counseling/discussion 02/12/2017  . Multiple lung nodules on CT 10/10/2016  . Alcoholism ([place]) 10/10/2016  . Cancer of supraglottis ([place]) 10/08/2016  . Chronic atrial fibrillation ([place]) 02/14/2015  . Chronic diastolic CHF (congestive heart failure) ([place]) 02/14/2015  . Chronic diastolic heart failure ([place]) 02/14/2015  . Prediabetes 01/23/2015  . Tobacco abuse 01/23/2015  . Acute on chronic congestive heart failure ([place]) 01/15/2015  . Acute congestive heart failure with left ventricular diastolic dysfunction ([place]) 01/14/2015  . Alcohol abuse, daily use 01/14/2015  . Acute respiratory failure ([place]) 01/14/2015  . Acute hyponatremia 01/14/2015  . Acute right heart failure ([place]) 01/14/2015  . Congestive heart disease  ([place])   . Right heart failure ([place]) 01/03/2015  . Cardiomegaly 01/03/2015  . Atrial fibrillation, controlled ([place]) 01/03/2015  . Moderate aortic stenosis 01/03/2015  . Chronic anticoagulation 01/03/2015  . Hypertension 01/03/2015  . Dyspnea 01/03/2015  . A-fib ([place]) 02/17/2014  . Long term current use of anticoagulant 02/17/2014  . Essential (primary) hypertension 11/01/2013  . Adult BMI 30+ 10/21/2013  . Adiposity 04/22/2013  . H/O cardiovascular disorder 04/22/2013  . Headache, tension-type 02/05/2013    Francisco Eyerly 06/05/2017, 10:25 AM  [place]  [place], [place], [address] Phone: [phone]   Fax:  [phone]  Name: Kevin Martin MRN: [id number] Date of Birth: Oct 29, 1950  Serafina Royals, PT 06/05/17 10:25 AM

## 2017-06-08 ENCOUNTER — Ambulatory Visit: Payer: Medicare Other | Admitting: Physical Therapy

## 2017-06-08 DIAGNOSIS — I890 Lymphedema, not elsewhere classified: Secondary | ICD-10-CM | POA: Diagnosis not present

## 2017-06-08 NOTE — Therapy (Signed)
[place], [place], [address] Phone: [phone]   Fax:  [phone]  Physical Therapy Treatment  Patient Details  Name: Kevin Martin MRN: [id number] Date of Birth: 1951/03/08 Referring Provider: Dr. Eppie Gibson  Encounter Date: 06/08/2017      PT End of Session - 06/08/17 1049    Visit Number 5   Number of Visits 9   Date for PT Re-Evaluation 07/01/17   PT Start Time 8676   PT Stop Time 1950  pt. had to leave early   PT Time Calculation (min) 24 min   Activity Tolerance Patient tolerated treatment well   Behavior During Therapy [facility] for tasks assessed/performed      Past Medical History:  Diagnosis Date  . Aortic stenosis    moderate by 12/16/15 echo  . Atrial fibrillation ([place])   . Cardiomegaly   . CHF (congestive heart failure) ([place])   . History of radiation therapy 10/23/16- 12/11/16   Laryngopharynx and bilateral neck, 70 Gy in 35 fractions.   . Hypertension   . Shortness of breath dyspnea     Past Surgical History:  Procedure Laterality Date  . BRONCHOSCOPY  03/16/2017   performed at [facility]   . COLONOSCOPY    . DIRECT LARYNGOSCOPY N/A 09/19/2016   Procedure: DIRECT LARYNGOSCOPY with BIOPSY;  Surgeon: Izora Gala, MD;  Location: [place];  Service: ENT;  Laterality: N/A;  . ESOPHAGOSCOPY N/A 09/19/2016   Procedure: ESOPHAGOSCOPY;  Surgeon: Izora Gala, MD;  Location: [place];  Service: ENT;  Laterality: N/A;  . FRACTURE SURGERY Right    right arm  . NECK SURGERY      There were no vitals filed for this visit.      Subjective Assessment - 06/08/17 1024    Subjective (P)  Still getting stuff caught in my throat--it keeps me up at night.   Pertinent History (P)  Pt. with supraglottic invasive squamous cell carcinoma diagnosed 09/19/16.  Plans to have XRT only; no chemo due to h/o cardiac problems.  Will start XRT 10/23/16.  CHF, atrial fibrillation, cardiomegaly, HTN. Finished treatment at  [facility] of XRT 12/11/16. Is now going to [facility] and getting chemo there once every three weeks; started that about a month ago. Going to [facility] because of cancer (possibly metastases) on his lungs.   Currently in Pain? (P)  Yes   Pain Score (P)  2    Pain Location (P)  Throat   Pain Descriptors / Indicators (P)  Sore   Pain Frequency (P)  Constant                         [facility] Adult PT Treatment/Exercise - 06/08/17 0001      Manual Therapy   Manual Lymphatic Drainage (MLD) in supine with head of bed elevated:  diaphragmatic breathing, supraclavicular fossae, bilat. axillae, bilat. shoulder collectors, pectoral areas and upper flank bilat.; posterolateral, lateral, anterolateral, and anterior neck; chin, cheeks and preauricular areas; then retraced steps with pathway down to axillae                        Long Term Clinic Goals - 06/02/17 1512      CC Long Term Goal  #1   Title Pt. will be knowledgeable about performing manual lymph drainage on himself.   Status Achieved     CC Long Term Goal  #2   Title Neck  circumference measurement at 4 cm. superior to sternal notch will be reduced by at least 1.5 cm. to 49 cm. or less.   Baseline 50.5 cm. at eval; 49 on 06/02/17   Status Achieved     CC Long Term Goal  #3   Title Neck circumference measurement at 8 cm. superior to sternal notch will reduce by at least 1.5 cm. to 51 cm. or less.   Baseline 52.5 cm. on eval; 50.9 on 06/02/17   Status Achieved     CC Long Term Goal  #4   Title Neck circumference at 4 cm superior to sternal notch reduced to 48.5 or less   Baseline 49 on 06/02/17 when goal was written   Time 2   Period Weeks   Status New     CC Long Term Goal  #5   Title Neck circumference at 8 cm. superior to sternal notch reduced to 50.5 cm. or less   Baseline 50.9 when goal written on 06/02/17   Time 2   Period Weeks   Status New         Head and Neck Clinic Goals - 10/14/16 1049      Patient  will be able to verbalize understanding of a home exercise program for cervical range of motion, posture, and walking.    Status Achieved     Patient will be able to verbalize understanding of proper sitting and standing posture.    Status Achieved     Patient will be able to verbalize understanding of lymphedema risk and availability of treatment for this condition.    Status Achieved           Plan - 06/08/17 1050    Clinical Impression Statement Continuing to feel benefit of manual lymph drainage, including a small benefit for swallowing; also continuing compression   Rehab Potential Good   Clinical Impairments Affecting Rehab Potential hx of radiation   PT Frequency 2x / week   PT Duration 4 weeks   PT Treatment/Interventions ADLs/Self Care Home Management;DME Instruction;Patient/family education;Orthotic Fit/Training;Manual techniques;Manual lymph drainage;Compression bandaging;Taping;Therapeutic exercise;Passive range of motion   PT Next Visit Plan Continue manual lymph drainage and monitoring neck circumference measurements.   PT Home Exercise Plan use compression garment, do self-manual lymph drainage   Consulted and Agree with Plan of Care Patient      Patient will benefit from skilled therapeutic intervention in order to improve the following deficits and impairments:  Increased edema, Decreased knowledge of use of DME, Decreased knowledge of precautions  Visit Diagnosis: Lymphedema, not elsewhere classified     Problem List Patient Active Problem List   Diagnosis Date Noted  . 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 Goals of care, counseling/discussion 02/12/2017  . Multiple lung nodules on CT 10/10/2016  . Alcoholism ([place]) 10/10/2016  . Cancer of supraglottis ([place]) 10/08/2016  . Chronic atrial fibrillation ([place]) 02/14/2015  . Chronic diastolic CHF (congestive heart failure) ([place]) 02/14/2015  . Chronic diastolic heart failure ([place]) 02/14/2015   . Prediabetes 01/23/2015  . Tobacco abuse 01/23/2015  . Acute on chronic congestive heart failure ([place]) 01/15/2015  . Acute congestive heart failure with left ventricular diastolic dysfunction ([place]) 01/14/2015  . Alcohol abuse, daily use 01/14/2015  . Acute respiratory failure ([place]) 01/14/2015  . Acute hyponatremia 01/14/2015  . Acute right heart failure ([place]) 01/14/2015  . Congestive heart disease ([place])   . Right heart failure ([place]) 01/03/2015  . Cardiomegaly 01/03/2015  . Atrial fibrillation, controlled ([place]) 01/03/2015  .  Moderate aortic stenosis 01/03/2015  . Chronic anticoagulation 01/03/2015  . Hypertension 01/03/2015  . Dyspnea 01/03/2015  . A-fib ([place]) 02/17/2014  . Long term current use of anticoagulant 02/17/2014  . Essential (primary) hypertension 11/01/2013  . Adult BMI 30+ 10/21/2013  . Adiposity 04/22/2013  . H/O cardiovascular disorder 04/22/2013  . Headache, tension-type 02/05/2013    Elbridge Magowan 06/08/2017, 10:52 AM  [place], [place], [address] Phone: [phone]   Fax:  [phone]  Name: TRENTAN TRIPPE MRN: [id number] Date of Birth: 05/30/1951  Serafina Royals, PT 06/08/17 10:52 AM

## 2017-06-10 ENCOUNTER — Encounter: Payer: Medicare Other | Admitting: Physical Therapy

## 2017-06-15 ENCOUNTER — Ambulatory Visit: Payer: Medicare Other | Admitting: Physical Therapy

## 2017-06-15 DIAGNOSIS — R293 Abnormal posture: Secondary | ICD-10-CM

## 2017-06-15 DIAGNOSIS — I890 Lymphedema, not elsewhere classified: Secondary | ICD-10-CM | POA: Diagnosis not present

## 2017-06-15 NOTE — Therapy (Signed)
[place], [place], [address] Phone: [phone]   Fax:  [phone]  Physical Therapy Treatment  Patient Details  Name: Kevin Martin MRN: [id number] Date of Birth: 1950/12/02 Referring Provider: Dr. Eppie Gibson  Encounter Date: 06/15/2017      PT End of Session - 06/15/17 1608    Visit Number 6   Number of Visits 9   Date for PT Re-Evaluation 07/01/17   PT Start Time 1519   PT Stop Time 1600   PT Time Calculation (min) 41 min   Activity Tolerance Patient tolerated treatment well   Behavior During Therapy [facility] for tasks assessed/performed      Past Medical History:  Diagnosis Date  . Aortic stenosis    moderate by 12/16/15 echo  . Atrial fibrillation ([place])   . Cardiomegaly   . CHF (congestive heart failure) ([place])   . History of radiation therapy 10/23/16- 12/11/16   Laryngopharynx and bilateral neck, 70 Gy in 35 fractions.   . Hypertension   . Shortness of breath dyspnea     Past Surgical History:  Procedure Laterality Date  . BRONCHOSCOPY  03/16/2017   performed at [facility]   . COLONOSCOPY    . DIRECT LARYNGOSCOPY N/A 09/19/2016   Procedure: DIRECT LARYNGOSCOPY with BIOPSY;  Surgeon: Izora Gala, MD;  Location: [place];  Service: ENT;  Laterality: N/A;  . ESOPHAGOSCOPY N/A 09/19/2016   Procedure: ESOPHAGOSCOPY;  Surgeon: Izora Gala, MD;  Location: [place];  Service: ENT;  Laterality: N/A;  . FRACTURE SURGERY Right    right arm  . NECK SURGERY      There were no vitals filed for this visit.      Subjective Assessment - 06/15/17 1520    Subjective This is driving me crazy--I can't get rid of it--the phlegm and the soreness.  Goes back to [facility] next week on Wednesday and plans to ask about it then.  "I'm losing weight, I can tell--bad." Can't always wear the compression garment because "it smothers me."   Pertinent History Pt. with supraglottic invasive squamous cell carcinoma diagnosed 09/19/16.   Plans to have XRT only; no chemo due to h/o cardiac problems.  Will start XRT 10/23/16.  CHF, atrial fibrillation, cardiomegaly, HTN. Finished treatment at [facility] of XRT 12/11/16. Is now going to [facility] and getting chemo there once every three weeks; started that about a month ago. Going to [facility] because of cancer (possibly metastases) on his lungs.   Currently in Pain? Yes   Pain Score 2    Pain Location Throat   Aggravating Factors  worse at night   Pain Relieving Factors hydrocodone; can't even drink anything now because it's hurts               LYMPHEDEMA/ONCOLOGY QUESTIONNAIRE - 06/15/17 1556      Head and Neck   4 cm superior to sternal notch around neck 48.9 cm   6 cm superior to sternal notch around neck 50 cm   8 cm superior to sternal notch around neck 51 cm   Other Says he has lost some weight, but not sure how much.                  [place] Adult PT Treatment/Exercise - 06/15/17 0001      Manual Therapy   Edema Management circumference measurements taken   Manual Lymphatic Drainage (MLD) in supine with head of bed elevated:  diaphragmatic breathing, supraclavicular fossae, bilat.  axillae, bilat. shoulder collectors, pectoral areas and upper flank bilat.; posterolateral, lateral, anterolateral, and anterior neck; chin, cheeks and preauricular areas; then retraced steps with pathway down to axillae; also in sitting, worked anterior and posterior chest/upper back toward axillae                        [place] - 06/15/17 1612      CC Long Term Goal  #4   Title Neck circumference at 4 cm superior to sternal notch reduced to 48.5 or less   Status On-going     CC Long Term Goal  #5   Title Neck circumference at 8 cm. superior to sternal notch reduced to 50.5 cm. or less   Status On-going         Head and Neck Clinic Goals - 10/14/16 1049      Patient will be able to verbalize understanding of a home exercise program for cervical  range of motion, posture, and walking.    Status Achieved     Patient will be able to verbalize understanding of proper sitting and standing posture.    Status Achieved     Patient will be able to verbalize understanding of lymphedema risk and availability of treatment for this condition.    Status Achieved           Plan - 06/15/17 1609    Clinical Impression Statement Pt.'s circumference measurements remained about the same today as those taken a couple of weeks ago.  He has not been able to tolerate wearing his compression garment very much because of current throat discomfort.  His neck tissue remains very soft with no evidence of fibrosis. He is not currently feeling very well, having a sore throat and difficulty clearing excretions from his throat; he plans to ask his doctors at [facility] about this next week.   Rehab Potential Good   Clinical Impairments Affecting Rehab Potential hx of radiation   PT Frequency 2x / week   PT Duration 4 weeks   PT Treatment/Interventions ADLs/Self Care Home Management;DME Instruction;Patient/family education;Orthotic Fit/Training;Manual techniques;Manual lymph drainage;Compression bandaging;Taping;Therapeutic exercise;Passive range of motion   PT Next Visit Plan Continue manual lymph drainage and monitoring neck circumference measurements, add neck ROM and posture re-ed.   PT Home Exercise Plan use compression garment, do self-manual lymph drainage   Consulted and Agree with Plan of Care Patient      Patient will benefit from skilled therapeutic intervention in order to improve the following deficits and impairments:  Increased edema, Decreased knowledge of use of DME, Decreased knowledge of precautions  Visit Diagnosis: Lymphedema, not elsewhere classified  Abnormal posture     Problem List Patient Active Problem List   Diagnosis Date Noted  . 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 Goals of care, counseling/discussion  02/12/2017  . Multiple lung nodules on CT 10/10/2016  . Alcoholism ([place]) 10/10/2016  . Cancer of supraglottis ([place]) 10/08/2016  . Chronic atrial fibrillation ([place]) 02/14/2015  . Chronic diastolic CHF (congestive heart failure) ([place]) 02/14/2015  . Chronic diastolic heart failure ([place]) 02/14/2015  . Prediabetes 01/23/2015  . Tobacco abuse 01/23/2015  . Acute on chronic congestive heart failure ([place]) 01/15/2015  . Acute congestive heart failure with left ventricular diastolic dysfunction ([place]) 01/14/2015  . Alcohol abuse, daily use 01/14/2015  . Acute respiratory failure ([place]) 01/14/2015  . Acute hyponatremia 01/14/2015  . Acute right heart failure ([place]) 01/14/2015  . Congestive heart disease (  [place])   . Right heart failure ([place]) 01/03/2015  . Cardiomegaly 01/03/2015  . Atrial fibrillation, controlled ([place]) 01/03/2015  . Moderate aortic stenosis 01/03/2015  . Chronic anticoagulation 01/03/2015  . Hypertension 01/03/2015  . Dyspnea 01/03/2015  . A-fib ([place]) 02/17/2014  . Long term current use of anticoagulant 02/17/2014  . Essential (primary) hypertension 11/01/2013  . Adult BMI 30+ 10/21/2013  . Adiposity 04/22/2013  . H/O cardiovascular disorder 04/22/2013  . Headache, tension-type 02/05/2013    Kevin Martin 06/15/2017, 4:15 PM  [place], [place], [address] Phone: [phone]   Fax:  [phone]  Name: Kevin Martin MRN: [id number] Date of Birth: [birth year]  Serafina Royals, PT 06/15/17 4:15 PM

## 2017-06-17 ENCOUNTER — Ambulatory Visit: Payer: Medicare Other | Admitting: Physical Therapy

## 2017-06-22 ENCOUNTER — Ambulatory Visit: Payer: Medicare Other | Admitting: Physical Therapy

## 2017-06-22 DIAGNOSIS — R293 Abnormal posture: Secondary | ICD-10-CM

## 2017-06-22 DIAGNOSIS — I890 Lymphedema, not elsewhere classified: Secondary | ICD-10-CM

## 2017-06-22 NOTE — Therapy (Signed)
[place], [place], [address] Phone: [phone]   Fax:  [phone]  Physical Therapy Treatment  Patient Details  Name: Kevin Martin MRN: [id number] Date of Birth: [birth year] Referring Provider: Dr. Eppie Gibson  Encounter Date: 06/22/2017      PT End of Session - 06/22/17 1148    Visit Number 7   Number of Visits 9   Date for PT Re-Evaluation 07/01/17   PT Start Time 1101   PT Stop Time 1147   PT Time Calculation (min) 46 min   Activity Tolerance Patient tolerated treatment well  coughed, cleared his throat a moderate amount during treatment   Behavior During Therapy [facility] for tasks assessed/performed      Past Medical History:  Diagnosis Date  . Aortic stenosis    moderate by 12/16/15 echo  . Atrial fibrillation ([place])   . Cardiomegaly   . CHF (congestive heart failure) ([place])   . History of radiation therapy 10/23/16- 12/11/16   Laryngopharynx and bilateral neck, 70 Gy in 35 fractions.   . Hypertension   . Shortness of breath dyspnea     Past Surgical History:  Procedure Laterality Date  . BRONCHOSCOPY  03/16/2017   performed at [facility]   . COLONOSCOPY    . DIRECT LARYNGOSCOPY N/A 09/19/2016   Procedure: DIRECT LARYNGOSCOPY with BIOPSY;  Surgeon: Izora Gala, MD;  Location: [place];  Service: ENT;  Laterality: N/A;  . ESOPHAGOSCOPY N/A 09/19/2016   Procedure: ESOPHAGOSCOPY;  Surgeon: Izora Gala, MD;  Location: [place];  Service: ENT;  Laterality: N/A;  . FRACTURE SURGERY Right    right arm  . NECK SURGERY      There were no vitals filed for this visit.      Subjective Assessment - 06/22/17 1104    Subjective Neck discomfort and throat stuffiness comes and goes; it's worse at night. Is able to wear the compression garment some, but sometimes he feels like he's smothering if he puts it on.   Pertinent History Pt. with supraglottic invasive squamous cell carcinoma diagnosed 09/19/16.  Plans to  have XRT only; no chemo due to h/o cardiac problems.  Will start XRT 10/23/16.  CHF, atrial fibrillation, cardiomegaly, HTN. Finished treatment at [facility] of XRT 12/11/16. Is now going to [facility] and getting chemo there once every three weeks; started that about a month ago. Going to [facility] because of cancer (possibly metastases) on his lungs.   Currently in Pain? Yes   Pain Score 3    Pain Location Throat   Aggravating Factors  worse at night   Pain Relieving Factors hydrocodone at night (can't take during daytime)               LYMPHEDEMA/ONCOLOGY QUESTIONNAIRE - 06/22/17 1142      Head and Neck   4 cm superior to sternal notch around neck 49.6 cm   6 cm superior to sternal notch around neck 50.6 cm   8 cm superior to sternal notch around neck 51.7 cm   Other measured after manual lymph drainage today                  [facility] Adult PT Treatment/Exercise - 06/22/17 0001      Manual Therapy   Manual Therapy Passive ROM;Manual Traction   Manual Lymphatic Drainage (MLD) in supine with head of bed elevated:  diaphragmatic breathing, supraclavicular fossae, bilat. axillae, bilat. shoulder collectors; posterolateral, lateral, anterolateral, and anterior neck; chin, cheeks  and preauricular areas; then retraced steps with pathway down to axillae; at end, with patient sitting, worked anterior and posterior chest, shoulders, axillae.   Passive ROM in supine with HOB elevated with stretch into sidebend, rotation, flexion and levator stretches bilat. to tolerance  right side of neck is very tight                        [place] - 06/15/17 1612      CC Long Term Goal  #4   Title Neck circumference at 4 cm superior to sternal notch reduced to 48.5 or less   Status On-going     CC Long Term Goal  #5   Title Neck circumference at 8 cm. superior to sternal notch reduced to 50.5 cm. or less   Status On-going         Head and Neck Clinic Goals - 10/14/16 1049       Patient will be able to verbalize understanding of a home exercise program for cervical range of motion, posture, and walking.    Status Achieved     Patient will be able to verbalize understanding of proper sitting and standing posture.    Status Achieved     Patient will be able to verbalize understanding of lymphedema risk and availability of treatment for this condition.    Status Achieved           Plan - 06/22/17 1149    Clinical Impression Statement Pt.'s circumference measurements were increased a bit today at all three levels measured, with measurements taken following manual lymph drainage.  He has not been feeling well lately, including today.  Sore throat and phlegm that he can't clear continue to bother him. His neck feels very tight on PROM, especially with moving into left sidebend.   Rehab Potential Good   Clinical Impairments Affecting Rehab Potential hx of radiation   PT Frequency 2x / week   PT Treatment/Interventions ADLs/Self Care Home Management;DME Instruction;Patient/family education;Orthotic Fit/Training;Manual techniques;Manual lymph drainage;Compression bandaging;Taping;Therapeutic exercise;Passive range of motion   PT Next Visit Plan Pt. was going to schedule two more visits; will need to decide whether to continue beyond those.  Continue neck ROM and manual lymph drainage and monitoring neck circumference measurements, add posture re-ed.   PT Home Exercise Plan use compression garment, do self-manual lymph drainage   Consulted and Agree with Plan of Care Patient      Patient will benefit from skilled therapeutic intervention in order to improve the following deficits and impairments:  Increased edema, Decreased knowledge of use of DME, Decreased knowledge of precautions  Visit Diagnosis: Lymphedema, not elsewhere classified  Abnormal posture     Problem List Patient Active Problem List   Diagnosis Date Noted  . Metastasis to  lung ([place]) 02/12/2017  . Goals of care, counseling/discussion 02/12/2017  . Multiple lung nodules on CT 10/10/2016  . Alcoholism ([place]) 10/10/2016  . Cancer of supraglottis ([place]) 10/08/2016  . Chronic atrial fibrillation ([place]) 02/14/2015  . Chronic diastolic CHF (congestive heart failure) ([place]) 02/14/2015  . Chronic diastolic heart failure ([place]) 02/14/2015  . Prediabetes 01/23/2015  . Tobacco abuse 01/23/2015  . Acute on chronic congestive heart failure ([place]) 01/15/2015  . Acute congestive heart failure with left ventricular diastolic dysfunction ([place]) 01/14/2015  . Alcohol abuse, daily use 01/14/2015  . Acute respiratory failure ([place]) 01/14/2015  . Acute hyponatremia 01/14/2015  . Acute right heart failure ([place]) 01/14/2015  .  Congestive heart disease ([place])   . Right heart failure ([place]) 01/03/2015  . Cardiomegaly 01/03/2015  . Atrial fibrillation, controlled ([place]) 01/03/2015  . Moderate aortic stenosis 01/03/2015  . Chronic anticoagulation 01/03/2015  . Hypertension 01/03/2015  . Dyspnea 01/03/2015  . A-fib ([place]) 02/17/2014  . Long term current use of anticoagulant 02/17/2014  . Essential (primary) hypertension 11/01/2013  . Adult BMI 30+ 10/21/2013  . Adiposity 04/22/2013  . H/O cardiovascular disorder 04/22/2013  . Headache, tension-type 02/05/2013    SALISBURY,DONNA 06/22/2017, 11:52 AM  [place], [place], [address] Phone: [phone]   Fax:  [phone]  Name: TARIK TEIXEIRA MRN: [id number] Date of Birth: 1950/09/30  Serafina Royals, PT 06/22/17 11:53 AM

## 2017-07-01 ENCOUNTER — Inpatient Hospital Stay (HOSPITAL_COMMUNITY)
Admission: EM | Admit: 2017-07-01 | Discharge: 2017-07-05 | DRG: 809 | Disposition: A | Discharge status: Home / Self Care | Payer: Medicare Other | Attending: Family Medicine | Admitting: Family Medicine

## 2017-07-01 ENCOUNTER — Ambulatory Visit: Payer: Medicare Other | Admitting: Physical Therapy

## 2017-07-01 ENCOUNTER — Emergency Department (HOSPITAL_COMMUNITY): Payer: Medicare Other

## 2017-07-01 ENCOUNTER — Encounter (HOSPITAL_COMMUNITY): Payer: Self-pay | Admitting: Emergency Medicine

## 2017-07-01 DIAGNOSIS — I5032 Chronic diastolic (congestive) heart failure: Secondary | ICD-10-CM | POA: Diagnosis present

## 2017-07-01 DIAGNOSIS — I480 Paroxysmal atrial fibrillation: Secondary | ICD-10-CM | POA: Diagnosis present

## 2017-07-01 DIAGNOSIS — E871 Hypo-osmolality and hyponatremia: Secondary | ICD-10-CM | POA: Diagnosis present

## 2017-07-01 DIAGNOSIS — I110 Hypertensive heart disease with heart failure: Secondary | ICD-10-CM | POA: Diagnosis present

## 2017-07-01 DIAGNOSIS — R509 Fever, unspecified: Secondary | ICD-10-CM | POA: Diagnosis present

## 2017-07-01 DIAGNOSIS — D709 Neutropenia, unspecified: Principal | ICD-10-CM | POA: Diagnosis present

## 2017-07-01 DIAGNOSIS — R293 Abnormal posture: Secondary | ICD-10-CM

## 2017-07-01 DIAGNOSIS — I350 Nonrheumatic aortic (valve) stenosis: Secondary | ICD-10-CM | POA: Diagnosis present

## 2017-07-01 DIAGNOSIS — C7800 Secondary malignant neoplasm of unspecified lung: Secondary | ICD-10-CM | POA: Diagnosis present

## 2017-07-01 DIAGNOSIS — Z79899 Other long term (current) drug therapy: Secondary | ICD-10-CM

## 2017-07-01 DIAGNOSIS — Z87891 Personal history of nicotine dependence: Secondary | ICD-10-CM

## 2017-07-01 DIAGNOSIS — R5081 Fever presenting with conditions classified elsewhere: Secondary | ICD-10-CM | POA: Diagnosis present

## 2017-07-01 DIAGNOSIS — K219 Gastro-esophageal reflux disease without esophagitis: Secondary | ICD-10-CM | POA: Diagnosis present

## 2017-07-01 DIAGNOSIS — K1231 Oral mucositis (ulcerative) due to antineoplastic therapy: Secondary | ICD-10-CM

## 2017-07-01 DIAGNOSIS — E785 Hyperlipidemia, unspecified: Secondary | ICD-10-CM | POA: Diagnosis present

## 2017-07-01 DIAGNOSIS — I890 Lymphedema, not elsewhere classified: Secondary | ICD-10-CM

## 2017-07-01 DIAGNOSIS — Z7901 Long term (current) use of anticoagulants: Secondary | ICD-10-CM

## 2017-07-01 DIAGNOSIS — N390 Urinary tract infection, site not specified: Secondary | ICD-10-CM | POA: Diagnosis present

## 2017-07-01 DIAGNOSIS — Z923 Personal history of irradiation: Secondary | ICD-10-CM

## 2017-07-01 DIAGNOSIS — I959 Hypotension, unspecified: Secondary | ICD-10-CM | POA: Diagnosis present

## 2017-07-01 DIAGNOSIS — C139 Malignant neoplasm of hypopharynx, unspecified: Secondary | ICD-10-CM | POA: Diagnosis present

## 2017-07-01 LAB — PROTIME-INR
INR: 2.13
PROTHROMBIN TIME: 23.7 s — AB (ref 11.4–15.2)

## 2017-07-01 LAB — CBC WITH DIFFERENTIAL/PLATELET
BASOS ABS: 0 10*3/uL (ref 0.0–0.1)
Basophils Relative: 0 %
EOS ABS: 0 10*3/uL (ref 0.0–0.7)
EOS PCT: 0 %
HCT: 26.2 % — ABNORMAL LOW (ref 39.0–52.0)
HEMOGLOBIN: 8.9 g/dL — AB (ref 13.0–17.0)
Lymphocytes Relative: 46 %
Lymphs Abs: 0 10*3/uL — ABNORMAL LOW (ref 0.7–4.0)
MCH: 28.2 pg (ref 26.0–34.0)
MCHC: 34 g/dL (ref 30.0–36.0)
MCV: 82.9 fL (ref 78.0–100.0)
MONOS PCT: 39 %
Monocytes Absolute: 0 10*3/uL — ABNORMAL LOW (ref 0.1–1.0)
NEUTROS PCT: 15 %
Neutro Abs: 0 10*3/uL — ABNORMAL LOW (ref 1.7–7.7)
PLATELETS: 75 10*3/uL — AB (ref 150–400)
RBC: 3.16 MIL/uL — ABNORMAL LOW (ref 4.22–5.81)
RDW: 19.2 % — ABNORMAL HIGH (ref 11.5–15.5)
WBC: 0.1 10*3/uL — AB (ref 4.0–10.5)

## 2017-07-01 LAB — I-STAT CG4 LACTIC ACID, ED: Lactic Acid, Venous: 1.14 mmol/L (ref 0.5–1.9)

## 2017-07-01 LAB — COMPREHENSIVE METABOLIC PANEL
ALT: 18 U/L (ref 17–63)
ANION GAP: 9 (ref 5–15)
AST: 24 U/L (ref 15–41)
Albumin: 3.3 g/dL — ABNORMAL LOW (ref 3.5–5.0)
Alkaline Phosphatase: 42 U/L (ref 38–126)
BUN: 10 mg/dL (ref 6–20)
CO2: 27 mmol/L (ref 22–32)
Calcium: 8.6 mg/dL — ABNORMAL LOW (ref 8.9–10.3)
Chloride: 95 mmol/L — ABNORMAL LOW (ref 101–111)
Creatinine, Ser: 0.47 mg/dL — ABNORMAL LOW (ref 0.61–1.24)
GFR calc non Af Amer: >60 mL/min (ref >60)
Glucose, Bld: 111 mg/dL — ABNORMAL HIGH (ref 65–99)
POTASSIUM: 3.8 mmol/L (ref 3.5–5.1)
SODIUM: 131 mmol/L — AB (ref 135–145)
Total Bilirubin: 1.3 mg/dL — ABNORMAL HIGH (ref 0.3–1.2)
Total Protein: 5.9 g/dL — ABNORMAL LOW (ref 6.5–8.1)

## 2017-07-01 MED ORDER — SODIUM CHLORIDE 0.9 % IV BOLUS (SEPSIS)
1000.0000 mL | Freq: Once | INTRAVENOUS | Status: AC
  Administered 2017-07-01: 1000 mL via INTRAVENOUS

## 2017-07-01 MED ORDER — DEXTROSE 5 % IV SOLN
2.0000 g | Freq: Once | INTRAVENOUS | Status: AC
  Administered 2017-07-01: 2 g via INTRAVENOUS
  Filled 2017-07-01: qty 2

## 2017-07-01 MED ORDER — SODIUM CHLORIDE 0.9 % IV BOLUS (SEPSIS): 1000.0000 mL | Freq: Once | INTRAVENOUS | Status: DC

## 2017-07-01 MED ORDER — DEXTROSE 5 % IV SOLN
1.0000 g | Freq: Once | INTRAVENOUS | Status: DC
  Filled 2017-07-01: qty 1

## 2017-07-01 NOTE — ED Notes (Signed)
Gave pt a urinal

## 2017-07-01 NOTE — Therapy (Addendum)
[facility] [address] [place], [place], [address] Phone: [phone]   Fax:  [phone]  Physical Therapy Treatment  Patient Details  Name: Kevin Martin MRN: [id number] Date of Birth: 19-Mar-1951 Referring Provider: Dr. Lonie Peak  Encounter Date: 07/01/2017      PT End of Session - 07/01/17 1342    Visit Number 8   Number of Visits 9   Date for PT Re-Evaluation 07/01/17   PT Start Time 1300   PT Stop Time 1340   PT Time Calculation (min) 40 min   Activity Tolerance Patient tolerated treatment well   Behavior During Therapy [facility] for tasks assessed/performed      Past Medical History:  Diagnosis Date  . Aortic stenosis    moderate by 12/16/15 echo  . Atrial fibrillation (HCC)   . Cardiomegaly   . CHF (congestive heart failure) (HCC)   . History of radiation therapy 10/23/16- 12/11/16   Laryngopharynx and bilateral neck, 70 Gy in 35 fractions.   . Hypertension   . Shortness of breath dyspnea     Past Surgical History:  Procedure Laterality Date  . BRONCHOSCOPY  03/16/2017   performed at [facility]   . COLONOSCOPY    . DIRECT LARYNGOSCOPY N/A 09/19/2016   Procedure: DIRECT LARYNGOSCOPY with BIOPSY;  Surgeon: Serena Colonel, MD;  Location: [facility] OR;  Service: ENT;  Laterality: N/A;  . ESOPHAGOSCOPY N/A 09/19/2016   Procedure: ESOPHAGOSCOPY;  Surgeon: Serena Colonel, MD;  Location: [facility] OR;  Service: ENT;  Laterality: N/A;  . FRACTURE SURGERY Right    right arm  . NECK SURGERY      There were no vitals filed for this visit.      Subjective Assessment - 07/01/17 1302    Subjective Had treatment at [facility] a week ago.  I always feel bad about 4-5 days after that. I can't get my nose to stop bleeding. They're going to have to replace my heart valve--it's shrinking. I'm wondering whether to stop treatment. Depression is setting in.  I'm hungry and I can't eat. Has tried a heating pad and ice on his neck and both seem to help it.    Pertinent History Pt. with supraglottic invasive squamous cell carcinoma diagnosed 09/19/16.  Plans to have XRT only; no chemo due to h/o cardiac problems.  Will start XRT 10/23/16.  CHF, atrial fibrillation, cardiomegaly, HTN. Finished treatment at [facility] of XRT 12/11/16. Is now going to [facility] and getting chemo there once every three weeks; started that about a month ago. Going to [facility] because of cancer (possibly metastases) on his lungs.   Currently in Pain? Yes   Pain Score 4    Pain Location Throat   Aggravating Factors  worse at night   Pain Relieving Factors hydrocodone at night               LYMPHEDEMA/ONCOLOGY QUESTIONNAIRE - 07/01/17 1306      Head and Neck   4 cm superior to sternal notch around neck 49 cm   6 cm superior to sternal notch around neck 50.2 cm   8 cm superior to sternal notch around neck 51.5 cm   Other measured before treatment                  [facility] Adult PT Treatment/Exercise - 07/01/17 0001      Manual Therapy   Manual Lymphatic Drainage (MLD) in supine with head of bed elevated:  diaphragmatic breathing, supraclavicular fossae, bilat.  axillae, bilat. shoulder collectors, pectoral areas and upper flank bilat.; posterolateral, lateral, anterolateral, and anterior neck; chin, cheeks and preauricular areas; then retraced steps with pathway down to axillae   Passive ROM in supine with HOB elevated, stretches into sidebend and rotation bilat.; brief levator stretches also; gentle manual traction                        Long Term Clinic Goals - 06/15/17 1612      CC Long Term Goal  #4   Title Neck circumference at 4 cm superior to sternal notch reduced to 48.5 or less   Status On-going     CC Long Term Goal  #5   Title Neck circumference at 8 cm. superior to sternal notch reduced to 50.5 cm. or less   Status On-going         Head and Neck Clinic Goals - 10/14/16 1049      Patient will be able to verbalize understanding of a  home exercise program for cervical range of motion, posture, and walking.    Status Achieved     Patient will be able to verbalize understanding of proper sitting and standing posture.    Status Achieved     Patient will be able to verbalize understanding of lymphedema risk and availability of treatment for this condition.    Status Achieved           Plan - 07/01/17 1342    Clinical Impression Statement Pt.'s circumference measurements were reduced a bit today, and were taken prior to manual lymph drainage as well. He has not been feeling well of late, in part due to having had chemo a week ago today.   Rehab Potential Good   Clinical Impairments Affecting Rehab Potential hx of radiation   PT Frequency 2x / week   PT Duration 4 weeks   PT Next Visit Plan Check goals. Probable last visit on Friday this week. Neck ROM and manual lymph drainage   Consulted and Agree with Plan of Care Patient      Patient will benefit from skilled therapeutic intervention in order to improve the following deficits and impairments:  Increased edema, Decreased knowledge of use of DME, Decreased knowledge of precautions  Visit Diagnosis: Lymphedema, not elsewhere classified  Abnormal posture     Problem List Patient Active Problem List   Diagnosis Date Noted  . 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 Goals of care, counseling/discussion 02/12/2017  . Multiple lung nodules on CT 10/10/2016  . Alcoholism ([place]) 10/10/2016  . Cancer of supraglottis ([place]) 10/08/2016  . Chronic atrial fibrillation ([place]) 02/14/2015  . Chronic diastolic CHF (congestive heart failure) ([place]) 02/14/2015  . Chronic diastolic heart failure ([place]) 02/14/2015  . Prediabetes 01/23/2015  . Tobacco abuse 01/23/2015  . Acute on chronic congestive heart failure ([place]) 01/15/2015  . Acute congestive heart failure with left ventricular diastolic dysfunction ([place]) 01/14/2015  . Alcohol abuse, daily use 01/14/2015   . Acute respiratory failure ([place]) 01/14/2015  . Acute hyponatremia 01/14/2015  . Acute right heart failure ([place]) 01/14/2015  . Congestive heart disease ([place])   . Right heart failure ([place]) 01/03/2015  . Cardiomegaly 01/03/2015  . Atrial fibrillation, controlled ([place]) 01/03/2015  . Moderate aortic stenosis 01/03/2015  . Chronic anticoagulation 01/03/2015  . Hypertension 01/03/2015  . Dyspnea 01/03/2015  . A-fib ([place]) 02/17/2014  . Long term current use of anticoagulant 02/17/2014  . Essential (primary) hypertension 11/01/2013  .  Adult BMI 30+ 10/21/2013  . Adiposity 04/22/2013  . H/O cardiovascular disorder 04/22/2013  . Headache, tension-type 02/05/2013    SALISBURY,DONNA 07/01/2017, 1:45 PM  [place], [place], [address] Phone: [phone]   Fax:  [phone]  Name: Kevin Martin MRN: [id number] Date of Birth: Oct 23, 1950  Serafina Royals, PT 07/01/17 1:45 PM  PHYSICAL THERAPY DISCHARGE SUMMARY  Visits from Start of Care: 8  Current functional level related to goals / functional outcomes: Initial long term goals were met, so more aggressive goals were added for neck circumference reduction, but these were not fully met.   Remaining deficits: Neck swelling will continue to require management.   Education / Equipment: Self-care for neck lymphedema management. Plan: Patient agrees to discharge.  Patient goals were partially met. Patient is being discharged due to not returning since the last visit.  ?????    He had one more appointment scheduled but did not come; his chart indicates he had suffered a wrist fracture in the meantime.  Serafina Royals, PT 08/21/17 10:24 AM

## 2017-07-01 NOTE — ED Triage Notes (Signed)
Brought in by EMS from home with c/o fever and generalized weakness.  Pt reported that he has been having "fever since this afternoon"--- took Tylenol 1000 mg at around 1600 and another Tylenol 1000 mg at around 2030.   No thermometer was used but pt did remarked "having a fever.  Pt also c/o generalized weakness, with low BP by EMS---- was given NS 500 ml bolus en route.  Pt has lung ca and has had his chemo last Wednesday.

## 2017-07-01 NOTE — ED Notes (Signed)
Bed: VQ25 Expected date:  Expected time:  Means of arrival:  Comments: EMS 66 yo male from home/cancer patient/weakness/fever BP 90/40-IVF's HR 70

## 2017-07-01 NOTE — ED Provider Notes (Signed)
[facility] DEPT Provider Note   CSN: [id number] Arrival date & time: 07/01/17  2228     History   Chief Complaint Chief Complaint  Patient presents with  . Fever    HPI Kevin Martin is a 66 y.o. 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  Pt reports he took tylenol at that time.  Associated symptoms include generalized weakness and lightheadedness upon standing.  Pt denies urinary symptoms, abd pain, nausea, vomiting, diarrhea, cough, worsening SOB or chest pain.  Per EMS, pt was hypotensive and given NS 590mL with improvement in BP.  NO known aggravating or alleviating factors.  Patient reports his last chemotherapy infusion was 10/24 (He is currently C4D1 of taxotere/carboplatin).   The history is provided by the patient and medical records. No language interpreter was used.    Past Medical History:  Diagnosis Date  . Aortic stenosis    moderate by 12/16/15 echo  . Atrial fibrillation ([place])   . Cardiomegaly   . CHF (congestive heart failure) ([place])   . History of radiation therapy 10/23/16- 12/11/16   Laryngopharynx and bilateral neck, 70 Gy in 35 fractions.   . Hypertension   . Shortness of breath dyspnea     Patient Active Problem List   Diagnosis Date Noted  . Fever 07/02/2017  . Neutropenia ([place]) 07/02/2017  . 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 Goals of care, counseling/discussion 02/12/2017  . Multiple lung nodules on CT 10/10/2016  . Alcoholism ([place]) 10/10/2016  . Cancer of supraglottis ([place]) 10/08/2016  . Chronic atrial fibrillation ([place]) 02/14/2015  . Chronic diastolic CHF  (congestive heart failure) ([place]) 02/14/2015  . Chronic diastolic heart failure ([place]) 02/14/2015  . Prediabetes 01/23/2015  . Tobacco abuse 01/23/2015  . Acute on chronic congestive heart failure ([place]) 01/15/2015  . Acute congestive heart failure with left ventricular diastolic dysfunction ([place]) 01/14/2015  . Alcohol abuse, daily use 01/14/2015  . Acute respiratory failure ([place]) 01/14/2015  . Hyponatremia 01/14/2015  . Acute right heart failure ([place]) 01/14/2015  . Congestive heart disease ([place])   . Right heart failure ([place]) 01/03/2015  . Cardiomegaly 01/03/2015  . Atrial fibrillation, controlled ([place]) 01/03/2015  . Moderate aortic stenosis 01/03/2015  . Chronic anticoagulation 01/03/2015  . Hypertension 01/03/2015  . Dyspnea 01/03/2015  . A-fib ([place]) 02/17/2014  . Long term current use of anticoagulant 02/17/2014  . Essential (primary) hypertension 11/01/2013  . Adult BMI 30+ 10/21/2013  . Adiposity 04/22/2013  . H/O cardiovascular disorder 04/22/2013  . Headache, tension-type 02/05/2013    Past Surgical History:  Procedure Laterality Date  . BRONCHOSCOPY  03/16/2017   performed at [facility]   . COLONOSCOPY    . DIRECT LARYNGOSCOPY N/A 09/19/2016   Procedure: DIRECT LARYNGOSCOPY with BIOPSY;  Surgeon: Izora Gala, MD;  Location: [facility];  Service: ENT;  Laterality: N/A;  . ESOPHAGOSCOPY N/A 09/19/2016   Procedure: ESOPHAGOSCOPY;  Surgeon: Izora Gala, MD;  Location: [facility];  Service: ENT;  Laterality: N/A;  . FRACTURE SURGERY Right    right arm  . NECK SURGERY         Home Medications    Prior to Admission medications   Medication Sig Start Date End Date Taking?  Authorizing Provider  acetaminophen (TYLENOL) 500 MG tablet Take 1,000 mg by mouth every 6 (six) hours as needed for mild pain.   Yes [provider]  amLODipine (NORVASC) 10 MG tablet Take 1 tablet (10 mg total) by mouth daily. 01/23/15  Yes Arnoldo Morale, MD  dexamethasone (DECADRON) 4 MG tablet Take 8 mg by  mouth daily. Take for 2 days after chemo   Yes [provider]  docusate sodium (COLACE) 100 MG capsule Take 200 mg by mouth daily.    Yes [provider]  DPH-Lido-AlHydr-MgHydr-Simeth (FIRST-MOUTHWASH BLM) SUSP Use as directed 5 mLs in the mouth or throat every 6 (six) hours as needed (throat pain).   Yes [provider]  folic acid (FOLVITE) 240 MCG tablet Take 800 mcg by mouth daily.    Yes [provider]  furosemide (LASIX) 40 MG tablet Take 1 tablet (40 mg total) by mouth daily. 01/23/15  Yes Arnoldo Morale, MD  furosemide (LASIX) 40 MG tablet Take 40 mg by mouth daily.  02/02/17  Yes [provider]  HYDROcodone-acetaminophen (NORCO) 10-325 MG tablet Take 1 tablet by mouth every 6 (six) hours as needed for moderate pain or severe pain.   Yes [provider]  lisinopril (PRINIVIL,ZESTRIL) 40 MG tablet Take 1 tablet (40 mg total) by mouth daily. Patient taking differently: Take 20 mg by mouth daily.  01/23/15  Yes Arnoldo Morale, MD  ondansetron (ZOFRAN) 8 MG tablet Take 8 mg by mouth as directed. Take 8mg  by mouth twice daily for 2 days after chemo then every 8 hours as needed for nausea 04/22/17  Yes [provider]  polyethylene glycol (MIRALAX / GLYCOLAX) packet Take 17 g by mouth daily.    Yes [provider]  pravastatin (PRAVACHOL) 80 MG tablet Take 1 tablet (80 mg total) by mouth daily. Patient taking differently: Take 80 mg by mouth every evening.  01/23/15  Yes Arnoldo Morale, MD  rivaroxaban (XARELTO) 20 MG TABS tablet Take 1 tablet (20 mg total) by mouth daily with supper. 04/15/17  Yes Fay Records, MD  thiamine 100 MG tablet Take 1 tablet (100 mg total) by mouth daily. 01/16/15  Yes Elgergawy, Silver Huguenin, MD  lidocaine (XYLOCAINE) 2 % solution Patient: Mix 1part 2% viscous lidocaine, 1part H20. Swish and swallow 70mL of this mixture, 34min before meals and at bedtime, up to QID Patient not taking: Reported on 05/20/2017  11/10/16   Eppie Gibson, MD  sucralfate (CARAFATE) 1 g tablet Dissolve 1 tablet in 10 mL H20 and swallow up to QID for sore throat. Patient not taking: Reported on 07/02/2017 11/14/16   Eppie Gibson, MD    Family History Family History  Problem Relation Age of Onset  . Cancer Mother   . Cancer Father   . Stroke Maternal Grandfather   . Heart attack Neg Hx     Social History Social History  Substance Use Topics  . Smoking status: Former Smoker    Years: 10.00    Types: Cigars, Cigarettes    Quit date: 09/01/2016  . Smokeless tobacco: Never Used     Comment: quit cigarette smoking 10 years ago, smokes 5 cigars a day (swisher sweets)  . Alcohol use 0.0 oz/week     Comment: every day about 2 mixed drinks.      Allergies   Patient has no known allergies.   Review of Systems Review of Systems  Constitutional: Positive for chills, fatigue and fever ( subjective). Negative for appetite  change, diaphoresis and unexpected weight change.  HENT: Negative for mouth sores.   Eyes: Negative for visual disturbance.  Respiratory: Negative for cough, chest tightness, shortness of breath and wheezing.   Cardiovascular: Negative for chest pain.  Gastrointestinal: Negative for abdominal pain, constipation, diarrhea, nausea and vomiting.  Endocrine: Negative for polydipsia, polyphagia and polyuria.  Genitourinary: Negative for dysuria, frequency, hematuria and urgency.  Musculoskeletal: Negative for back pain and neck stiffness.  Skin: Negative for rash.  Allergic/Immunologic: Negative for immunocompromised state.  Neurological: Positive for weakness ( generalized). Negative for syncope, light-headedness and headaches.  Hematological: Does not bruise/bleed easily.  Psychiatric/Behavioral: Negative for sleep disturbance. The patient is not nervous/anxious.      Physical Exam Updated Vital Signs BP 112/60   Pulse 70   Temp 98.4 F (36.9 C) (Oral)   Resp 18   Ht 6\' 1"  (1.854 m)   Wt  87.1 kg (192 lb)   SpO2 94%   BMI 25.33 kg/m   Physical Exam  Constitutional: He appears well-developed and well-nourished. No distress.  Awake, alert, nontoxic appearance  HENT:  Head: Normocephalic and atraumatic.  Mouth/Throat: Posterior oropharyngeal erythema present. No oropharyngeal exudate.  Erythema and ulcerations of the posterior oropharynx  Eyes: Conjunctivae are normal. No scleral icterus.  Neck: Normal range of motion. Neck supple.  Cardiovascular: Normal rate, regular rhythm and intact distal pulses.   Pulmonary/Chest: Effort normal and breath sounds normal. No respiratory distress. He has no wheezes.  Equal chest expansion  Abdominal: Soft. Bowel sounds are normal. He exhibits no mass. There is no tenderness. There is no rebound and no guarding.  Musculoskeletal: Normal range of motion. He exhibits no edema.  Neurological: He is alert.  Speech is clear and goal oriented Moves extremities without ataxia  Skin: Skin is warm and dry. No lesion, no petechiae, no purpura and no rash noted. He is not diaphoretic. No erythema.  No rash,   Psychiatric: He has a normal mood and affect.  Nursing note and vitals reviewed.    ED Treatments / Results  Labs (all labs ordered are listed, but only abnormal results are displayed) Labs Reviewed  COMPREHENSIVE METABOLIC PANEL - Abnormal; Notable for the following:       Result Value   Sodium 131 (*)    Chloride 95 (*)    Glucose, Bld 111 (*)    Creatinine, Ser 0.47 (*)    Calcium 8.6 (*)    Total Protein 5.9 (*)    Albumin 3.3 (*)    Total Bilirubin 1.3 (*)    All other components within normal limits  CBC WITH DIFFERENTIAL/PLATELET - Abnormal; Notable for the following:    WBC 0.1 (*)    RBC 3.16 (*)    Hemoglobin 8.9 (*)    HCT 26.2 (*)    RDW 19.2 (*)    Platelets 75 (*)    Neutro Abs 0.0 (*)    Lymphs Abs 0.0 (*)    Monocytes Absolute 0.0 (*)    All other components within normal limits  PROTIME-INR - Abnormal;  Notable for the following:    Prothrombin Time 23.7 (*)    All other components within normal limits  CULTURE, BLOOD (ROUTINE X 2)  CULTURE, BLOOD (ROUTINE X 2)  URINE CULTURE  URINALYSIS, ROUTINE W REFLEX MICROSCOPIC  I-STAT CG4 LACTIC ACID, ED    ED ECG REPORT   Date: 07/02/2017  Rate: 67  Rhythm: atrial fibrillation and premature ventricular contractions (PVC)  QRS Axis: normal  Intervals: normal QT  ST/T Wave abnormalities: nonspecific ST changes  Conduction Disutrbances:none  Narrative Interpretation:   Old EKG Reviewed: unchanged from 05/08/17  I have personally reviewed the EKG tracing and agree with the computerized printout as noted.   Radiology Dg Chest 2 View  Result Date: 07/01/2017 CLINICAL DATA:  Fever and shortness of breath. History of lung cancer. EXAM: CHEST  2 VIEW COMPARISON:  PET CT 03/23/2017 FINDINGS: Normal cardiomediastinal contours. No focal airspace consolidation or pulmonary edema. There are scattered nodular opacities within both lungs that likely correlates to the numerous metastases identified on prior CT, but are poorly characterized on this radiograph. No pleural effusion or pneumothorax. Lung inflation is normal. Three circular metallic densities are buttons on the patient's sure. IMPRESSION: 1. No active cardiopulmonary disease. 2. Scattered nodular opacities are nonspecific but probably correlate to the nodular metastases identified on prior CT. Electronically Signed   By: Ulyses Jarred M.D.   On: 07/01/2017 23:24    Procedures Procedures (including critical care time)  Medications Ordered in ED Medications  ceFEPIme (MAXIPIME) 2 g in dextrose 5 % 50 mL IVPB (0 g Intravenous Stopped 07/02/17 0015)  sodium chloride 0.9 % bolus 1,000 mL (1,000 mLs Intravenous New Bag/Given 07/01/17 2347)     Initial Impression / Assessment and Plan / ED Course  I have reviewed the triage vital signs and the nursing notes.  Pertinent labs & imaging results  that were available during my care of the patient were reviewed by me and considered in my medical decision making (see chart for details).  Clinical Course as of Jul 02 133  Thu Jul 02, 2017  0045 Discussed with Dr. Maudie Mercury who will admit.  [HM]    Clinical Course User Index [HM] Surah Pelley, Jarrett Soho, [place]    Patient presents with reported fevers.  Neutropenic on arrival.  No infectious etiology at this time.  Chest x-ray without evidence of pneumonia.  No urinary symptoms.  No vomiting or diarrhea.  Patient with history of chemotherapy-induced mucositis he reports continued pain in his throat.  He denies worsening pain, cough, congestion.  Patient with some hypotension.  Normal lactic acid.  Patient given fluids and cefepime.   The patient was discussed with and seen by Dr. Ellender Hose who agrees with the treatment plan.   Final Clinical Impressions(s) / ED Diagnoses   Final diagnoses:  Neutropenic fever ([place])  Mucositis due to chemotherapy    New Prescriptions New Prescriptions   No medications on file     Agapito Games 07/02/17 0136    Duffy Bruce, MD 07/02/17 [phone]

## 2017-07-01 NOTE — ED Notes (Signed)
Date and time results received: 07/01/17 2316 Test: WBC Critical Value:0.1  Name of Provider Notified: Jarrett Soho [place] EDPA Orders Received? Or Actions Taken?:

## 2017-07-02 ENCOUNTER — Inpatient Hospital Stay (HOSPITAL_COMMUNITY): Payer: Medicare Other

## 2017-07-02 DIAGNOSIS — N390 Urinary tract infection, site not specified: Secondary | ICD-10-CM | POA: Diagnosis present

## 2017-07-02 DIAGNOSIS — I480 Paroxysmal atrial fibrillation: Secondary | ICD-10-CM | POA: Diagnosis present

## 2017-07-02 DIAGNOSIS — C139 Malignant neoplasm of hypopharynx, unspecified: Secondary | ICD-10-CM | POA: Diagnosis present

## 2017-07-02 DIAGNOSIS — C7800 Secondary malignant neoplasm of unspecified lung: Secondary | ICD-10-CM | POA: Diagnosis present

## 2017-07-02 DIAGNOSIS — D709 Neutropenia, unspecified: Secondary | ICD-10-CM | POA: Diagnosis not present

## 2017-07-02 DIAGNOSIS — Z7901 Long term (current) use of anticoagulants: Secondary | ICD-10-CM | POA: Diagnosis not present

## 2017-07-02 DIAGNOSIS — I350 Nonrheumatic aortic (valve) stenosis: Secondary | ICD-10-CM | POA: Diagnosis present

## 2017-07-02 DIAGNOSIS — K219 Gastro-esophageal reflux disease without esophagitis: Secondary | ICD-10-CM | POA: Diagnosis present

## 2017-07-02 DIAGNOSIS — I5032 Chronic diastolic (congestive) heart failure: Secondary | ICD-10-CM | POA: Diagnosis present

## 2017-07-02 DIAGNOSIS — R5081 Fever presenting with conditions classified elsewhere: Secondary | ICD-10-CM | POA: Diagnosis not present

## 2017-07-02 DIAGNOSIS — Z87891 Personal history of nicotine dependence: Secondary | ICD-10-CM | POA: Diagnosis not present

## 2017-07-02 DIAGNOSIS — Z923 Personal history of irradiation: Secondary | ICD-10-CM | POA: Diagnosis not present

## 2017-07-02 DIAGNOSIS — E871 Hypo-osmolality and hyponatremia: Secondary | ICD-10-CM | POA: Diagnosis present

## 2017-07-02 DIAGNOSIS — K1231 Oral mucositis (ulcerative) due to antineoplastic therapy: Secondary | ICD-10-CM | POA: Diagnosis present

## 2017-07-02 DIAGNOSIS — Z79899 Other long term (current) drug therapy: Secondary | ICD-10-CM | POA: Diagnosis not present

## 2017-07-02 DIAGNOSIS — I959 Hypotension, unspecified: Secondary | ICD-10-CM | POA: Diagnosis present

## 2017-07-02 DIAGNOSIS — E785 Hyperlipidemia, unspecified: Secondary | ICD-10-CM | POA: Diagnosis present

## 2017-07-02 DIAGNOSIS — I110 Hypertensive heart disease with heart failure: Secondary | ICD-10-CM | POA: Diagnosis present

## 2017-07-02 DIAGNOSIS — D701 Agranulocytosis secondary to cancer chemotherapy: Secondary | ICD-10-CM | POA: Diagnosis not present

## 2017-07-02 DIAGNOSIS — T451X5A Adverse effect of antineoplastic and immunosuppressive drugs, initial encounter: Secondary | ICD-10-CM | POA: Diagnosis not present

## 2017-07-02 DIAGNOSIS — R509 Fever, unspecified: Secondary | ICD-10-CM | POA: Diagnosis present

## 2017-07-02 LAB — COMPREHENSIVE METABOLIC PANEL
ALT: 18 U/L (ref 17–63)
ANION GAP: 8 (ref 5–15)
AST: 24 U/L (ref 15–41)
Albumin: 3.1 g/dL — ABNORMAL LOW (ref 3.5–5.0)
Alkaline Phosphatase: 36 U/L — ABNORMAL LOW (ref 38–126)
BUN: 9 mg/dL (ref 6–20)
CO2: 27 mmol/L (ref 22–32)
Calcium: 8.5 mg/dL — ABNORMAL LOW (ref 8.9–10.3)
Chloride: 95 mmol/L — ABNORMAL LOW (ref 101–111)
Creatinine, Ser: 0.53 mg/dL — ABNORMAL LOW (ref 0.61–1.24)
GFR calc non Af Amer: >60 mL/min (ref >60)
Glucose, Bld: 129 mg/dL — ABNORMAL HIGH (ref 65–99)
Potassium: 4.1 mmol/L (ref 3.5–5.1)
Sodium: 130 mmol/L — ABNORMAL LOW (ref 135–145)
Total Bilirubin: 1.3 mg/dL — ABNORMAL HIGH (ref 0.3–1.2)
Total Protein: 5.7 g/dL — ABNORMAL LOW (ref 6.5–8.1)

## 2017-07-02 LAB — RESPIRATORY PANEL BY PCR
ADENOVIRUS-RVPPCR: NOT DETECTED
Bordetella pertussis: NOT DETECTED
CORONAVIRUS HKU1-RVPPCR: NOT DETECTED
CORONAVIRUS NL63-RVPPCR: NOT DETECTED
CORONAVIRUS OC43-RVPPCR: NOT DETECTED
Chlamydophila pneumoniae: NOT DETECTED
Coronavirus 229E: NOT DETECTED
INFLUENZA A-RVPPCR: NOT DETECTED
Influenza B: NOT DETECTED
MYCOPLASMA PNEUMONIAE-RVPPCR: NOT DETECTED
Metapneumovirus: NOT DETECTED
PARAINFLUENZA VIRUS 1-RVPPCR: NOT DETECTED
PARAINFLUENZA VIRUS 3-RVPPCR: NOT DETECTED
PARAINFLUENZA VIRUS 4-RVPPCR: NOT DETECTED
Parainfluenza Virus 2: NOT DETECTED
Respiratory Syncytial Virus: NOT DETECTED
Rhinovirus / Enterovirus: NOT DETECTED

## 2017-07-02 LAB — TROPONIN I
Troponin I: <0.03 ng/mL (ref <0.03)
Troponin I: <0.03 ng/mL (ref <0.03)

## 2017-07-02 LAB — URINALYSIS, ROUTINE W REFLEX MICROSCOPIC
Bacteria, UA: NONE SEEN
Bilirubin Urine: NEGATIVE
GLUCOSE, UA: NEGATIVE mg/dL
Hgb urine dipstick: NEGATIVE
Ketones, ur: 5 mg/dL — AB
Leukocytes, UA: NEGATIVE
Nitrite: POSITIVE — AB
PH: 7 (ref 5.0–8.0)
PROTEIN: NEGATIVE mg/dL
SPECIFIC GRAVITY, URINE: 1.014 (ref 1.005–1.030)
SQUAMOUS EPITHELIAL / LPF: NONE SEEN

## 2017-07-02 LAB — CBC
HCT: 26.2 % — ABNORMAL LOW (ref 39.0–52.0)
HEMOGLOBIN: 8.9 g/dL — AB (ref 13.0–17.0)
MCH: 28.3 pg (ref 26.0–34.0)
MCHC: 34 g/dL (ref 30.0–36.0)
MCV: 83.4 fL (ref 78.0–100.0)
Platelets: 68 10*3/uL — ABNORMAL LOW (ref 150–400)
RBC: 3.14 MIL/uL — AB (ref 4.22–5.81)
RDW: 19.6 % — ABNORMAL HIGH (ref 11.5–15.5)
WBC: 0.1 10*3/uL — CL (ref 4.0–10.5)

## 2017-07-02 MED ORDER — ACETAMINOPHEN 650 MG RE SUPP: 650.0000 mg | Freq: Four times a day (QID) | RECTAL | Status: DC | PRN

## 2017-07-02 MED ORDER — TBO-FILGRASTIM 480 MCG/0.8ML ~~LOC~~ SOSY
480.0000 ug | PREFILLED_SYRINGE | Freq: Every day | SUBCUTANEOUS | Status: DC
  Administered 2017-07-02 – 2017-07-05 (×4): 480 ug via SUBCUTANEOUS
  Filled 2017-07-02 (×4): qty 0.8

## 2017-07-02 MED ORDER — SODIUM CHLORIDE 0.9 % IV SOLN
INTRAVENOUS | Status: AC
  Administered 2017-07-02: 03:00:00 via INTRAVENOUS

## 2017-07-02 MED ORDER — DEXAMETHASONE 4 MG PO TABS: 8.0000 mg | ORAL_TABLET | Freq: Every day | ORAL | Status: DC

## 2017-07-02 MED ORDER — RIVAROXABAN 20 MG PO TABS
20.0000 mg | ORAL_TABLET | Freq: Every day | ORAL | Status: DC
  Administered 2017-07-02 – 2017-07-04 (×3): 20 mg via ORAL
  Filled 2017-07-02 (×3): qty 1

## 2017-07-02 MED ORDER — ACETAMINOPHEN 325 MG PO TABS: 650.0000 mg | ORAL_TABLET | Freq: Four times a day (QID) | ORAL | Status: DC | PRN

## 2017-07-02 MED ORDER — FIRST-MOUTHWASH BLM MT SUSP: 5.0000 mL | Freq: Four times a day (QID) | OROMUCOSAL | Status: DC | PRN

## 2017-07-02 MED ORDER — DEXTROSE 5 % IV SOLN
2.0000 g | Freq: Three times a day (TID) | INTRAVENOUS | Status: DC
  Administered 2017-07-02 – 2017-07-05 (×11): 2 g via INTRAVENOUS
  Filled 2017-07-02 (×13): qty 2

## 2017-07-02 MED ORDER — IOPAMIDOL (ISOVUE-300) INJECTION 61%
75.0000 mL | Freq: Once | INTRAVENOUS | Status: AC | PRN
  Administered 2017-07-02: 75 mL via INTRAVENOUS

## 2017-07-02 MED ORDER — MAGIC MOUTHWASH
5.0000 mL | Freq: Four times a day (QID) | ORAL | Status: DC | PRN
  Filled 2017-07-02: qty 5

## 2017-07-02 MED ORDER — IOPAMIDOL (ISOVUE-300) INJECTION 61%
INTRAVENOUS | Status: AC
  Filled 2017-07-02: qty 75

## 2017-07-02 MED ORDER — HYDROCODONE-ACETAMINOPHEN 10-325 MG PO TABS
1.0000 | ORAL_TABLET | Freq: Four times a day (QID) | ORAL | Status: DC | PRN
  Administered 2017-07-02 – 2017-07-05 (×7): 1 via ORAL
  Filled 2017-07-02 (×7): qty 1

## 2017-07-02 MED ORDER — PRAVASTATIN SODIUM 40 MG PO TABS
80.0000 mg | ORAL_TABLET | Freq: Every evening | ORAL | Status: DC
  Administered 2017-07-02 – 2017-07-04 (×3): 80 mg via ORAL
  Filled 2017-07-02 (×3): qty 2

## 2017-07-02 MED ORDER — VITAMIN B-1 100 MG PO TABS
100.0000 mg | ORAL_TABLET | Freq: Every day | ORAL | Status: DC
  Administered 2017-07-02 – 2017-07-05 (×4): 100 mg via ORAL
  Filled 2017-07-02 (×4): qty 1

## 2017-07-02 MED ORDER — TBO-FILGRASTIM 480 MCG/0.8ML ~~LOC~~ SOSY: 480.0000 ug | PREFILLED_SYRINGE | Freq: Every day | SUBCUTANEOUS | Status: DC

## 2017-07-02 MED ORDER — VANCOMYCIN HCL IN DEXTROSE 1-5 GM/200ML-% IV SOLN
1000.0000 mg | Freq: Two times a day (BID) | INTRAVENOUS | Status: DC
  Administered 2017-07-02 – 2017-07-04 (×5): 1000 mg via INTRAVENOUS
  Filled 2017-07-02 (×5): qty 200

## 2017-07-02 MED ORDER — POLYETHYLENE GLYCOL 3350 17 G PO PACK
17.0000 g | PACK | Freq: Every day | ORAL | Status: DC
  Administered 2017-07-02 – 2017-07-05 (×4): 17 g via ORAL
  Filled 2017-07-02 (×4): qty 1

## 2017-07-02 MED ORDER — FOLIC ACID 1 MG PO TABS
1.0000 mg | ORAL_TABLET | Freq: Every day | ORAL | Status: DC
  Administered 2017-07-02 – 2017-07-05 (×4): 1 mg via ORAL
  Filled 2017-07-02 (×4): qty 1

## 2017-07-02 MED ORDER — ONDANSETRON HCL 4 MG PO TABS
8.0000 mg | ORAL_TABLET | ORAL | Status: DC
  Administered 2017-07-02: 8 mg via ORAL
  Filled 2017-07-02 (×2): qty 2

## 2017-07-02 MED ORDER — DOCUSATE SODIUM 100 MG PO CAPS
200.0000 mg | ORAL_CAPSULE | Freq: Every day | ORAL | Status: DC
  Administered 2017-07-02 – 2017-07-05 (×4): 200 mg via ORAL
  Filled 2017-07-02 (×4): qty 2

## 2017-07-02 NOTE — Progress Notes (Signed)
TO CT

## 2017-07-02 NOTE — Progress Notes (Addendum)
Subjective: Patient admitted this morning, see detailed H&P by Dr Maudie Mercury  65 y.o. 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 Pt presented this evening due to fever.  Vitals:   07/02/17 0830 07/02/17 0900  BP: (!) 123/56 118/66  Pulse: 69 70  Resp: (!) 21 19  Temp:  98.9 F (37.2 C)  SpO2: 94% 99%      A/P Squamous cell carcinoma of the hypopharynx Febrile neutropenia Hypertension Paroxysmal atrial fibrillation  Patient's WBC 0.1, will start Granix 480 mcg subcu daily until [place] greater than 1000. Continue broad-spectrum antibiotics vancomycin, cefepime We will also obtain CT soft tissue neck to rule out any underlying abscess/fluid collection Called and discussed with oncologist on call Dr Blanchard Mane [facility] Pager- [phone]

## 2017-07-02 NOTE — H&P (Signed)
TRH H&P   Patient Demographics:    Kevin Martin, is a 66 y.o. male  MRN: [id number]   DOB - Sep 25, 1950  Admit Date - 07/01/2017  Outpatient Primary MD for the patient is Berkley Harvey, NP  Referring MD/NP/PA: Forrestine Him  Outpatient Specialists:  Nori Riis Ready (oncology [place]) Dorris Carnes (cardiology)  Patient coming from: home  Chief Complaint  Patient presents with  . Fever      HPI:    Kevin Martin  is a 66 y.o. 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 Pt presented this evening due to fever.   In ED,  Na 131, Bun 10, Creatinine 0.47 Ast 24, Alt 18,  Wbc 0.1, Hgb 8.9, Plt 75 INR 2.13 Urinalysis pending  CXR   IMPRESSION: 1. No active cardiopulmonary disease. 2. Scattered nodular opacities are nonspecific but probably correlate to the nodular metastases identified on prior CT.  Pt will be admitted for febrile neutropenia      Review of systems:    In addition to the HPI above,  No Fever-chills, No Headache, No changes with Vision or hearing, No problems swallowing food or Liquids, No Chest pain, Shortness of Breath,  + slight dry cough No Abdominal pain, No Nausea or Vommitting, Bowel movements are regular, No Blood in stool or Urine, No dysuria, No new skin rashes or bruises, No new joints pains-aches,  No new weakness, tingling, numbness in any extremity, No recent weight gain or loss, No polyuria, polydypsia or polyphagia, No significant Mental Stressors.  A full 10 point Review of Systems was done, except as stated above, all other Review of Systems were negative.   With Past History of the following :    Past Medical History:  Diagnosis Date  . Aortic stenosis    moderate by 12/16/15 echo  . Atrial fibrillation ([place])   . Cardiomegaly   . CHF (congestive heart failure) ([place])   . History of radiation therapy 10/23/16- 12/11/16   Laryngopharynx and bilateral neck, 70 Gy in 35 fractions.   . Hypertension   . Shortness of breath dyspnea       Past Surgical History:  Procedure Laterality Date  . BRONCHOSCOPY  03/16/2017   performed at [facility]   . COLONOSCOPY    . DIRECT LARYNGOSCOPY N/A 09/19/2016   Procedure: DIRECT LARYNGOSCOPY with BIOPSY;  Surgeon: Izora Gala, MD;  Location: [place];  Service: ENT;  Laterality: N/A;  . ESOPHAGOSCOPY N/A 09/19/2016   Procedure: ESOPHAGOSCOPY;  Surgeon: Izora Gala, MD;  Location: [place];  Service: ENT;  Laterality: N/A;  . FRACTURE SURGERY Right    right arm  . NECK SURGERY        Social History:     Social History  Substance Use Topics  . Smoking status: Former Smoker    Years: 10.00    Types: Cigars, Cigarettes    Quit date: 09/01/2016  . Smokeless tobacco: Never Used     Comment: quit cigarette smoking 10 years ago, smokes 5 cigars a day ([employer]isher sweets)  . Alcohol use 0.0 oz/week     Comment: every day about 2 mixed drinks.      Lives - at home  Mobility - walks by self   Family History :     Family History  Problem Relation Age of Onset  . Cancer Mother   . Cancer Father   . Stroke Maternal Grandfather   . Heart attack Neg Hx       Home Medications:   Prior to Admission medications   Medication Sig Start Date End Date Taking? Authorizing Provider  acetaminophen (TYLENOL) 500 MG tablet Take 1,000 mg by mouth every 6 (six) hours as needed for mild pain.   Yes [provider]  amLODipine (NORVASC) 10 MG tablet Take 1 tablet (10 mg total) by mouth daily. 01/23/15  Yes Arnoldo Morale, MD  dexamethasone (DECADRON) 4 MG tablet Take 8 mg by mouth daily. Take for 2 days after chemo   Yes [provider]  docusate sodium (COLACE) 100 MG capsule Take 200 mg by mouth daily.    Yes  [provider]  DPH-Lido-AlHydr-MgHydr-Simeth (FIRST-MOUTHWASH BLM) SUSP Use as directed 5 mLs in the mouth or throat every 6 (six) hours as needed (throat pain).   Yes [provider]  folic acid (FOLVITE) 630 MCG tablet Take 800 mcg by mouth daily.    Yes [provider]  furosemide (LASIX) 40 MG tablet Take 1 tablet (40 mg total) by mouth daily. 01/23/15  Yes Arnoldo Morale, MD  furosemide (LASIX) 40 MG tablet Take 40 mg by mouth daily.  02/02/17  Yes [provider]  HYDROcodone-acetaminophen (NORCO) 10-325 MG tablet Take 1 tablet by mouth every 6 (six) hours as needed for moderate pain or severe pain.   Yes [provider]  lisinopril (PRINIVIL,ZESTRIL) 40 MG tablet Take 1 tablet (40 mg total) by mouth daily. Patient taking differently: Take 20 mg by mouth daily.  01/23/15  Yes Arnoldo Morale, MD  ondansetron (ZOFRAN) 8 MG tablet Take 8 mg by mouth as directed. Take 8mg  by mouth twice daily for 2 days after chemo then every 8 hours as needed for nausea 04/22/17  Yes [provider]  polyethylene glycol (MIRALAX / GLYCOLAX) packet Take 17 g by mouth daily.    Yes [provider]  pravastatin (PRAVACHOL) 80 MG tablet Take 1 tablet (80 mg total) by mouth daily. Patient taking differently: Take 80 mg by mouth every evening.  01/23/15  Yes Arnoldo Morale, MD  rivaroxaban (XARELTO) 20 MG TABS tablet Take 1 tablet (20 mg total) by mouth daily with supper. 04/15/17  Yes Fay Records, MD  thiamine 100 MG tablet Take 1 tablet (100 mg total) by mouth daily. 01/16/15  Yes Elgergawy, Silver Huguenin, MD  lidocaine (XYLOCAINE) 2 % solution Patient: Mix 1part 2% viscous lidocaine, 1part H20. Swish and swallow 86mL of this mixture, 44min before meals and at bedtime, up to QID Patient not taking: Reported on 05/20/2017 11/10/16   Eppie Gibson, MD  sucralfate (CARAFATE) 1 g tablet Dissolve 1 tablet in 10 mL H20 and swallow up to QID for sore throat. Patient not  taking: Reported on 07/02/2017 11/14/16   Eppie Gibson, MD  Allergies:    No Known Allergies   Physical Exam:   Vitals  Blood pressure (!) 101/56, pulse 68, temperature 98.4 F (36.9 C), temperature source Oral, resp. rate 14, height 6\' 1"  (1.854 m), weight 87.1 kg (192 lb), SpO2 97 %.   1. General lying in bed in NAD,   2. Normal affect and insight, Not Suicidal or Homicidal, Awake Alert, Oriented X 3.  3. No F.N deficits, ALL C.Nerves Intact, Strength 5/5 all 4 extremities, Sensation intact all 4 extremities, Plantars down going.  4. Ears and Eyes appear Normal, Conjunctivae clear, PERRLA. Moist Oral Mucosa.  5. Supple Neck, No JVD, No cervical lymphadenopathy appriciated, No Carotid Bruits.  6. Symmetrical Chest wall movement, Good air movement bilaterally, CTAB.  7. RRR, s1, s2, 2/6 sem rusb  8. Positive Bowel Sounds, Abdomen Soft, No tenderness, No organomegaly appriciated,No rebound -guarding or rigidity.  9.  No Cyanosis, Normal Skin Turgor, No Skin Rash or Bruise.  10. Good muscle tone,  joints appear normal , no effusions, Normal ROM.  11. No Palpable Lymph Nodes in Neck or Axillae     Data Review:    CBC  Recent Labs Lab 07/01/17 2241  WBC 0.1*  HGB 8.9*  HCT 26.2*  PLT 75*  MCV 82.9  MCH 28.2  MCHC 34.0  RDW 19.2*  LYMPHSABS 0.0*  MONOABS 0.0*  EOSABS 0.0  BASOSABS 0.0   ------------------------------------------------------------------------------------------------------------------  Chemistries   Recent Labs Lab 07/01/17 2241  NA 131*  K 3.8  CL 95*  CO2 27  GLUCOSE 111*  BUN 10  CREATININE 0.47*  CALCIUM 8.6*  AST 24  ALT 18  ALKPHOS 42  BILITOT 1.3*   ------------------------------------------------------------------------------------------------------------------ estimated creatinine clearance is 102.6 mL/min (A) (by C-G formula based on SCr of 0.47 mg/dL  (L)). ------------------------------------------------------------------------------------------------------------------ No results for input(s): TSH, T4TOTAL, T3FREE, THYROIDAB in the last 72 hours.  Invalid input(s): FREET3  Coagulation profile  Recent Labs Lab 07/01/17 2241  INR 2.13   ------------------------------------------------------------------------------------------------------------------- No results for input(s): DDIMER in the last 72 hours. -------------------------------------------------------------------------------------------------------------------  Cardiac Enzymes No results for input(s): CKMB, TROPONINI, MYOGLOBIN in the last 168 hours.  Invalid input(s): CK ------------------------------------------------------------------------------------------------------------------    Component Value Date/Time   BNP 246.0 (H) 01/14/2015 1239     ---------------------------------------------------------------------------------------------------------------  Urinalysis No results found for: COLORURINE, APPEARANCEUR, LABSPEC, PHURINE, GLUCOSEU, HGBUR, BILIRUBINUR, KETONESUR, PROTEINUR, UROBILINOGEN, NITRITE, LEUKOCYTESUR  ----------------------------------------------------------------------------------------------------------------   Imaging Results:    Dg Chest 2 View  Result Date: 07/01/2017 CLINICAL DATA:  Fever and shortness of breath. History of lung cancer. EXAM: CHEST  2 VIEW COMPARISON:  PET CT 03/23/2017 FINDINGS: Normal cardiomediastinal contours. No focal airspace consolidation or pulmonary edema. There are scattered nodular opacities within both lungs that likely correlates to the numerous metastases identified on prior CT, but are poorly characterized on this radiograph. No pleural effusion or pneumothorax. Lung inflation is normal. Three circular metallic densities are buttons on the patient's sure. IMPRESSION: 1. No active cardiopulmonary disease. 2.  Scattered nodular opacities are nonspecific but probably correlate to the nodular metastases identified on prior CT. Electronically Signed   By: Ulyses Jarred M.D.   On: 07/01/2017 23:24     Assessment & Plan:    Active Problems:   Fever   Neutropenia (HCC)    Fever Blood culture x2 Urine culture resp virus panel vanco iv, cefepime iv pharmacy to dose Please consult oncology in am   Neutropenia Neutropenic precautions Consider neupogen, defer to oncology  Hypertension Hold bp  medications as bp is low  Hyperlipidemia Cont pravastatin  Pafib Cont xarelto  Hypotension Tele Trop I q6h x3 Cortisol Ns iv  Gerd Cont sucralfate       DVT Prophylaxis Xarelto SCDs   AM Labs Ordered, also please review Full Orders  Family Communication: Admission, patients condition and plan of care including tests being ordered have been discussed with the patient  who indicate understanding and agree with the plan and Code Status.  Code Status FULL CODE  Likely DC to  home  Condition GUARDED   Consults called: none  Admission status: inpatient   Time spent in minutes : 45   Jani Gravel M.D on 07/02/2017 at 1:30 AM  Between 7am to 7pm - Pager - [phone]. After 7pm go to [email or web address] - password [facility]  [facility] - Office  [phone]

## 2017-07-02 NOTE — ED Notes (Signed)
Please call April with report at [place] [phone]). Andre Lefort

## 2017-07-02 NOTE — Progress Notes (Signed)
Pharmacy Antibiotic Note  Kevin Martin is a 66 y.o. male with fever and weakness admitted on 07/01/2017 with febrile neutropenia.  Pharmacy has been consulted for cefepime and vancomyin dosing.  Plan: Cefepime 2 Gm IV q8h Vancomycin 1 Gm IV q12h for AUC=489 Goal 400-500 F/u scr/cultures/levels  Height: 6\' 1"  (185.4 cm) Weight: 192 lb (87.1 kg) IBW/kg (Calculated) : 79.9  Temp (24hrs), Avg:98.4 F (36.9 C), Min:98.4 F (36.9 C), Max:98.4 F (36.9 C)   Recent Labs Lab 07/01/17 2241 07/01/17 2301  WBC 0.1*  --   CREATININE 0.47*  --   LATICACIDVEN  --  1.14    Estimated Creatinine Clearance: 102.6 mL/min (A) (by C-G formula based on SCr of 0.47 mg/dL (L)).    No Known Allergies  Antimicrobials this admission: 10/31 cefepime >>  11/1 vancomycin >>   Dose adjustments this admission:   Microbiology results:  BCx:   UCx:    Sputum:    MRSA PCR:   Thank you for allowing pharmacy to be a part of this patient's care.  Dorrene German 07/02/2017 2:41 AM

## 2017-07-02 NOTE — Progress Notes (Signed)
Back from CT

## 2017-07-02 NOTE — ED Notes (Signed)
Report given Floor RN

## 2017-07-03 ENCOUNTER — Ambulatory Visit: Payer: Medicare Other | Attending: Radiation Oncology | Admitting: Physical Therapy

## 2017-07-03 DIAGNOSIS — N390 Urinary tract infection, site not specified: Secondary | ICD-10-CM

## 2017-07-03 DIAGNOSIS — D701 Agranulocytosis secondary to cancer chemotherapy: Secondary | ICD-10-CM

## 2017-07-03 DIAGNOSIS — T451X5A Adverse effect of antineoplastic and immunosuppressive drugs, initial encounter: Secondary | ICD-10-CM

## 2017-07-03 LAB — CBC WITH DIFFERENTIAL/PLATELET
BASOS ABS: 0 10*3/uL (ref 0.0–0.1)
Basophils Relative: 0 %
EOS ABS: 0 10*3/uL (ref 0.0–0.7)
Eosinophils Relative: 0 %
HEMATOCRIT: 23.5 % — AB (ref 39.0–52.0)
Hemoglobin: 8.2 g/dL — ABNORMAL LOW (ref 13.0–17.0)
Lymphocytes Relative: 46 %
Lymphs Abs: 0.2 10*3/uL — ABNORMAL LOW (ref 0.7–4.0)
MCH: 28.7 pg (ref 26.0–34.0)
MCHC: 34.9 g/dL (ref 30.0–36.0)
MCV: 82.2 fL (ref 78.0–100.0)
MONO ABS: 0.1 10*3/uL (ref 0.1–1.0)
Monocytes Relative: 48 %
NEUTROS ABS: 0 10*3/uL — AB (ref 1.7–7.7)
Neutrophils Relative %: 6 %
PLATELETS: 67 10*3/uL — AB (ref 150–400)
RBC: 2.86 MIL/uL — AB (ref 4.22–5.81)
RDW: 19.7 % — AB (ref 11.5–15.5)
WBC: 0.3 10*3/uL — AB (ref 4.0–10.5)

## 2017-07-03 LAB — COMPREHENSIVE METABOLIC PANEL
ALBUMIN: 2.7 g/dL — AB (ref 3.5–5.0)
ALT: 14 U/L — ABNORMAL LOW (ref 17–63)
ANION GAP: 10 (ref 5–15)
AST: 18 U/L (ref 15–41)
Alkaline Phosphatase: 35 U/L — ABNORMAL LOW (ref 38–126)
BUN: 10 mg/dL (ref 6–20)
CHLORIDE: 90 mmol/L — AB (ref 101–111)
CO2: 26 mmol/L (ref 22–32)
Calcium: 8.4 mg/dL — ABNORMAL LOW (ref 8.9–10.3)
Creatinine, Ser: 0.51 mg/dL — ABNORMAL LOW (ref 0.61–1.24)
GFR calc Af Amer: >60 mL/min (ref >60)
GFR calc non Af Amer: >60 mL/min (ref >60)
GLUCOSE: 109 mg/dL — AB (ref 65–99)
POTASSIUM: 3.8 mmol/L (ref 3.5–5.1)
Sodium: 126 mmol/L — ABNORMAL LOW (ref 135–145)
TOTAL PROTEIN: 5.6 g/dL — AB (ref 6.5–8.1)
Total Bilirubin: 1 mg/dL (ref 0.3–1.2)

## 2017-07-03 NOTE — Progress Notes (Signed)
I had a chance to speak with Dr. Isidore Moos about this patient's CT scan, with her treating him previously.  Given his prior treatment, the findings of some swelling in the epiglottic area and surrounding region she felt were likely consistent with posttreatment effect.  Typically, this is something that is self-limiting and tends to resolve further over time.  There is not an easy way to further the healing process in this area to decrease swelling directly.  It is suspected that this is not related to the patient's complaint of fever and chills.  If the patient has local symptoms to this area, the patient could be set up for laryngoscopic exam with ENT, likely as an outpatient.

## 2017-07-03 NOTE — Progress Notes (Signed)
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 Pt presented this evening due to fever   Subjective   Patient seen and examined, denies shortness of breath or chest pain.   CT scan showed significant edema of the hypopharynx.   Assessment/Plan:     1. Febrile neutropenia-has been afebrile since patient came to the hospital.  Continue empiric antibiotics vancomycin and cefepime.  WBC slowly improving today total WBC 0.3.  Continue Granix 480 mcg subcu daily until [place] greater than 1000. 2. UTI-urine culture is positive, greater than 100,000 colonies per mL gram-positive cocci.  Continue vancomycin and cefepime.  Will await final culture and sensitivity results. 3. Squamous cell carcinoma of the hypopharynx-patient is receiving chemotherapy at [facility].  CT of the soft tissue neck showed diffuse swelling of the hypopharyngeal mucosa/epiglottis and aryepiglottic folds.  Findings represent posttreatment changes.  No fluid collection.  4. Hyponatremia-sodium is 126.  Likely from dehydration.  Follow BMP in a.m. 5. Hypertension-blood pressure stable, continue to hold antihypertensive medications. 6. Hyperlipidemia-continue pravastatin. 7. Paroxysmal atrial fibrillation-heart rate is controlled, continue Xarelto.    DVT prophylaxis: Patient on Xarelto  Code Status: Full code  Family Communication: No family at bedside  Disposition Plan: Likely home once neutropenia resolves.   Consultants:  None  Procedures:  None  Continuous infusions . ceFEPime (MAXIPIME) IV Stopped (07/03/17 1428)  . vancomycin 1,000 mg (07/03/17 1510)      Antibiotics:   Anti-infectives    Start      Dose/Rate Route Frequency Ordered Stop   07/02/17 0600  ceFEPIme (MAXIPIME) 2 g in dextrose 5 % 50 mL IVPB     2 g 100 mL/hr over 30 Minutes Intravenous Every 8 hours 07/02/17 0245     07/02/17 0400  vancomycin (VANCOCIN) IVPB 1000 mg/200 mL premix     1,000 mg 200 mL/hr over 60 Minutes Intravenous Every 12 hours 07/02/17 0245     07/01/17 2330  ceFEPIme (MAXIPIME) 1 g in dextrose 5 % 50 mL IVPB  Status:  Discontinued     1 g 100 mL/hr over 30 Minutes Intravenous  Once 07/01/17 2325 07/01/17 2328   07/01/17 2330  ceFEPIme (MAXIPIME) 2 g in dextrose 5 % 50 mL IVPB     2 g 100 mL/hr over 30 Minutes Intravenous  Once 07/01/17 2328 07/02/17 0015       Objective   Vitals:   07/02/17 2233 07/03/17 0421 07/03/17 0500 07/03/17 1438  BP: (!) 109/49 128/74  125/64  Pulse: 93 77  73  Resp: 20 20  20   Temp: 99.9 F (37.7 C) 99.4 F (37.4 C)  98.1 F (36.7 C)  TempSrc: Oral Oral  Oral  SpO2: 95% 96%  100%  Weight:   85.5 kg (188 lb 6.4 oz)   Height:        Intake/Output Summary (Last 24 hours) at 07/03/17 1548 Last data filed at 07/03/17 1440  Gross per 24 hour  Intake             1940 ml  Output             1550 ml  Net  390 ml   Filed Weights   07/01/17 2232 07/02/17 0900 07/03/17 0500  Weight: 87.1 kg (192 lb) 85.1 kg (187 lb 9.8 oz) 85.5 kg (188 lb 6.4 oz)     Physical Examination:   Physical Exam: Eyes: No icterus, extraocular muscles intact  Mouth: Oral mucosa is moist, no lesions on palate,  Neck: Supple, no deformities, masses, or tenderness Lungs: Normal respiratory effort, bilateral clear to auscultation, no crackles or wheezes.  Heart: Regular rate and rhythm, S1 and S2 normal, no murmurs, rubs auscultated Abdomen: BS normoactive,soft,nondistended,non-tender to palpation,no organomegaly Extremities: No pretibial edema, no erythema, no cyanosis, no clubbing Neuro : Alert and oriented to time, place and person, No focal deficits Skin: No rashes seen  on exam   Data Reviewed: I have personally reviewed following labs and imaging studies  CBG: No results for input(s): GLUCAP in the last 168 hours.  CBC:  Recent Labs Lab 07/01/17 2241 07/02/17 0456 07/03/17 0826  WBC 0.1* 0.1* 0.3*  NEUTROABS 0.0*  --  0.0*  HGB 8.9* 8.9* 8.2*  HCT 26.2* 26.2* 23.5*  MCV 82.9 83.4 82.2  PLT 75* 68* 67*    Basic Metabolic Panel:  Recent Labs Lab 07/01/17 2241 07/02/17 0456 07/03/17 0826  NA 131* 130* 126*  K 3.8 4.1 3.8  CL 95* 95* 90*  CO2 27 27 26   GLUCOSE 111* 129* 109*  BUN 10 9 10   CREATININE 0.47* 0.53* 0.51*  CALCIUM 8.6* 8.5* 8.4*    Recent Results (from the past 240 hour(s))  Culture, blood (Routine x 2)     Status: None (Preliminary result)   Collection Time: 07/01/17 10:47 PM  Result Value Ref Range Status   Specimen Description BLOOD BLOOD RIGHT FOREARM  Final   Special Requests   Final    BOTTLES DRAWN AEROBIC AND ANAEROBIC Blood Culture adequate volume   Culture   Final    NO GROWTH 1 DAY Performed at [facility], [address], [place], [place] [address]    Report Status PENDING  Incomplete  Culture, blood (Routine x 2)     Status: None (Preliminary result)   Collection Time: 07/01/17 11:03 PM  Result Value Ref Range Status   Specimen Description BLOOD BLOOD LEFT FOREARM  Final   Special Requests   Final    BOTTLES DRAWN AEROBIC AND ANAEROBIC Blood Culture adequate volume   Culture   Final    NO GROWTH 1 DAY Performed at [facility], [place] [address], [place], [place] [address]    Report Status PENDING  Incomplete  Respiratory Panel by PCR     Status: None   Collection Time: 07/02/17  2:40 AM  Result Value Ref Range Status   Adenovirus NOT DETECTED NOT DETECTED Final   Coronavirus 229E NOT DETECTED NOT DETECTED Final   Coronavirus HKU1 NOT DETECTED NOT DETECTED Final   Coronavirus NL63 NOT DETECTED NOT DETECTED Final   Coronavirus OC43 NOT DETECTED NOT DETECTED Final   Metapneumovirus  NOT DETECTED NOT DETECTED Final   Rhinovirus / Enterovirus NOT DETECTED NOT DETECTED Final   Influenza A NOT DETECTED NOT DETECTED Final   Influenza B NOT DETECTED NOT DETECTED Final   Parainfluenza Virus 1 NOT DETECTED NOT DETECTED Final   Parainfluenza Virus 2 NOT DETECTED NOT DETECTED Final   Parainfluenza Virus 3 NOT DETECTED NOT DETECTED Final   Parainfluenza Virus 4 NOT DETECTED NOT DETECTED Final   Respiratory Syncytial Virus NOT DETECTED NOT DETECTED Final  Bordetella pertussis NOT DETECTED NOT DETECTED Final   Chlamydophila pneumoniae NOT DETECTED NOT DETECTED Final   Mycoplasma pneumoniae NOT DETECTED NOT DETECTED Final    Comment: Performed at [facility], [place] [address], [place], [place] [address]  Urine culture     Status: Abnormal (Preliminary result)   Collection Time: 07/02/17  4:56 AM  Result Value Ref Range Status   Specimen Description URINE, CLEAN CATCH  Final   Special Requests NONE  Final   Culture (A)  Final    >=100,000 COLONIES/mL GRAM POSITIVE COCCI IDENTIFICATION AND SUSCEPTIBILITIES TO FOLLOW Performed at [facility], [address], [place], [place] [address]    Report Status PENDING  Incomplete     Liver Function Tests:  Recent Labs Lab 07/01/17 2241 07/02/17 0456 07/03/17 0826  AST 24 24 18   ALT 18 18 14*  ALKPHOS 42 36* 35*  BILITOT 1.3* 1.3* 1.0  PROT 5.9* 5.7* 5.6*  ALBUMIN 3.3* 3.1* 2.7*   No results for input(s): LIPASE, AMYLASE in the last 168 hours. No results for input(s): AMMONIA in the last 168 hours.  Cardiac Enzymes:  Recent Labs Lab 07/02/17 0242 07/02/17 0912 07/02/17 1502  TROPONINI <0.03 <0.03 <0.03   BNP (last 3 results) No results for input(s): BNP in the last 8760 hours.  ProBNP (last 3 results) No results for input(s): PROBNP in the last 8760 hours.    Studies: Dg Chest 2 View  Result Date: 07/01/2017 CLINICAL DATA:  Fever and shortness of breath. History of lung cancer. EXAM: CHEST  2  VIEW COMPARISON:  PET CT 03/23/2017 FINDINGS: Normal cardiomediastinal contours. No focal airspace consolidation or pulmonary edema. There are scattered nodular opacities within both lungs that likely correlates to the numerous metastases identified on prior CT, but are poorly characterized on this radiograph. No pleural effusion or pneumothorax. Lung inflation is normal. Three circular metallic densities are buttons on the patient's sure. IMPRESSION: 1. No active cardiopulmonary disease. 2. Scattered nodular opacities are nonspecific but probably correlate to the nodular metastases identified on prior CT. Electronically Signed   By: Ulyses Jarred M.D.   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 EXAM: CT NECK WITH CONTRAST TECHNIQUE: Multidetector CT imaging of the neck was performed using the standard protocol following the bolus administration of intravenous contrast. CONTRAST:  63mL ISOVUE-300 IOPAMIDOL (ISOVUE-300) INJECTION 61% COMPARISON:  09/03/2016 CT of the neck.  03/23/2017 PET-CT. FINDINGS: Pharynx and larynx: Diffuse swelling of the hypopharyngeal mucosa, epiglottis, and aryepiglottic folds. No fluid collection. No exophytic mass. Stable dystrophic calcification of the epiglottis. Salivary glands: No inflammation, mass, or stone. Thyroid: Normal. Lymph nodes: None enlarged or abnormal density. Vascular: Dense calcific atherosclerosis of the carotid bifurcations. Limited intracranial: Negative. Visualized orbits: Negative. Mastoids and visualized paranasal sinuses: Clear. Skeleton: Stable grade 1 C3-4 anterolisthesis and moderate cervical spondylosis predominantly at the C4-C6 levels. Disc and facet disease insert neural foramen bilaterally greatest at the right C3-4 level. No  high-grade bony canal stenosis. Upper chest: 12 mm right upper lobe nodule may be slightly increased in size from prior study or represent confluence of 2 prior pulmonary nodules on PET-CT. Otherwise multiple pulmonary nodules in lung apices are overall decreased in size. Other: None. IMPRESSION: 1. Diffuse swelling of hypopharyngeal mucosa, epiglottis, and  aryepiglottic folds. No fluid collection. Findings probably represent posttreatment changes. Infectious pharyngitis is also possible. 2. No recurrent hypopharyngeal mass identified. No cervical lymphadenopathy. 3. Single right upper lobe nodule may be slightly increased in size from prior PET-CT or represent confluence of 2 prior nodules. Otherwise pulmonary nodules in the lung apices are overall decreased in size. Electronically Signed   By: Kristine Garbe M.D.   On: 07/02/2017 14:59    Scheduled Meds: . docusate sodium  200 mg Oral Daily  . folic acid  1 mg Oral Daily  . ondansetron  8 mg Oral UD  . polyethylene glycol  17 g Oral Daily  . pravastatin  80 mg Oral QPM  . rivaroxaban  20 mg Oral Q supper  . Tbo-Filgrastim  480 mcg Subcutaneous Daily  . thiamine  100 mg Oral Daily      Time spent: 25 min  [facility] Pager [phone]. If 7PM-7AM, please contact night-coverage at [email or web address], Office  [phone]  password [email or web address]  07/03/2017, 3:48 PM  LOS: 1 day

## 2017-07-03 NOTE — Care Management Note (Signed)
Case Management Note  Patient Details  Name: Kevin Martin MRN: [id number] Date of Birth: 03/27/1951  Subjective/Objective:66 y/o m admitted w/Febrile neutropenia. 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 From home.                    Action/Plan:d/c plan home.   Expected Discharge Date:   (unknown)               Expected Discharge Plan:  Home/Self Care  In-House Referral:     Discharge planning Services  CM Consult  Post Acute Care Choice:    Choice offered to:     DME Arranged:    DME Agency:     HH Arranged:    HH Agency:     Status of Service:  In process, will continue to follow  If discussed at Long Length of Stay Meetings, dates discussed:    Additional Comments:  Dessa Phi, RN 07/03/2017, 11:42 AM

## 2017-07-04 LAB — COMPREHENSIVE METABOLIC PANEL
ALK PHOS: 40 U/L (ref 38–126)
ALT: 24 U/L (ref 17–63)
AST: 29 U/L (ref 15–41)
Albumin: 2.7 g/dL — ABNORMAL LOW (ref 3.5–5.0)
Anion gap: 9 (ref 5–15)
BILIRUBIN TOTAL: 1.3 mg/dL — AB (ref 0.3–1.2)
BUN: 10 mg/dL (ref 6–20)
CALCIUM: 8.4 mg/dL — AB (ref 8.9–10.3)
CO2: 26 mmol/L (ref 22–32)
CREATININE: 0.49 mg/dL — AB (ref 0.61–1.24)
Chloride: 93 mmol/L — ABNORMAL LOW (ref 101–111)
GFR calc Af Amer: >60 mL/min (ref >60)
Glucose, Bld: 88 mg/dL (ref 65–99)
Potassium: 3.5 mmol/L (ref 3.5–5.1)
Sodium: 128 mmol/L — ABNORMAL LOW (ref 135–145)
TOTAL PROTEIN: 5.6 g/dL — AB (ref 6.5–8.1)

## 2017-07-04 LAB — CBC WITH DIFFERENTIAL/PLATELET
BLASTS: 0 %
Band Neutrophils: 14 %
Basophils Absolute: 0 10*3/uL (ref 0.0–0.1)
Basophils Relative: 0 %
EOS ABS: 0 10*3/uL (ref 0.0–0.7)
EOS PCT: 0 %
HEMATOCRIT: 24.5 % — AB (ref 39.0–52.0)
HEMOGLOBIN: 8.3 g/dL — AB (ref 13.0–17.0)
Lymphocytes Relative: 49 %
Lymphs Abs: 0.5 10*3/uL — ABNORMAL LOW (ref 0.7–4.0)
MCH: 27.9 pg (ref 26.0–34.0)
MCHC: 33.9 g/dL (ref 30.0–36.0)
MCV: 82.5 fL (ref 78.0–100.0)
METAMYELOCYTES PCT: 3 %
MONO ABS: 0.1 10*3/uL (ref 0.1–1.0)
MYELOCYTES: 0 %
Monocytes Relative: 14 %
NEUTROS ABS: 0.3 10*3/uL — AB (ref 1.7–7.7)
Neutrophils Relative %: 20 %
OTHER: 0 %
Platelets: 76 10*3/uL — ABNORMAL LOW (ref 150–400)
Promyelocytes Absolute: 0 %
RBC: 2.97 MIL/uL — AB (ref 4.22–5.81)
RDW: 19.4 % — AB (ref 11.5–15.5)
WBC: 0.9 10*3/uL — AB (ref 4.0–10.5)
nRBC: 14 /100 WBC — ABNORMAL HIGH

## 2017-07-04 LAB — URINE CULTURE

## 2017-07-04 NOTE — Progress Notes (Signed)
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 Pt presented this evening due to fever   Subjective   Patient seen and examined, denies difficulty swallowing.  Neck swelling has improved    Assessment/Plan:     1. Febrile neutropenia-has been afebrile since patient came to the hospital.  Patient was started on empiric antibiotics vancomycin and cefepime.  WBC slowly improving today total WBC 0.9.  Continue Granix 480 mcg subcu daily until [place] greater than 1000. 2. UTI-urine culture is positive, greater than 100,000 colonies per mL gram-positive cocci.  Querulous negative staph.  Vancomycin has been discontinued.  Continue cefepime.  Will discharge patient home on p.o. antibiotics once absolute neutrophil count is greater than 1000. 3. Squamous cell carcinoma of the hypopharynx-patient is receiving chemotherapy at [facility].  CT of the soft tissue neck showed diffuse swelling of the hypopharyngeal mucosa/epiglottis and aryepiglottic folds.  Findings represent posttreatment changes.  No fluid collection.  Discussed with radiation oncology. 4. Hyponatremia-sodium is 128 today, slowly improving.  Likely from dehydration.  Follow BMP in a.m. 5. Hypertension-blood pressure stable, continue to hold antihypertensive medications. 6. Hyperlipidemia-continue pravastatin. 7. Paroxysmal atrial fibrillation-heart rate is controlled, continue Xarelto.    DVT prophylaxis: Patient on Xarelto  Code Status: Full code  Family Communication: No family at bedside  Disposition Plan: Likely home once neutropenia resolves.   Consultants:  None  Procedures:  None  Continuous infusions . ceFEPime  (MAXIPIME) IV Stopped (07/04/17 1418)      Antibiotics:   Anti-infectives    Start     Dose/Rate Route Frequency Ordered Stop   07/02/17 0600  ceFEPIme (MAXIPIME) 2 g in dextrose 5 % 50 mL IVPB     2 g 100 mL/hr over 30 Minutes Intravenous Every 8 hours 07/02/17 0245     07/02/17 0400  vancomycin (VANCOCIN) IVPB 1000 mg/200 mL premix  Status:  Discontinued     1,000 mg 200 mL/hr over 60 Minutes Intravenous Every 12 hours 07/02/17 0245 07/04/17 1200   07/01/17 2330  ceFEPIme (MAXIPIME) 1 g in dextrose 5 % 50 mL IVPB  Status:  Discontinued     1 g 100 mL/hr over 30 Minutes Intravenous  Once 07/01/17 2325 07/01/17 2328   07/01/17 2330  ceFEPIme (MAXIPIME) 2 g in dextrose 5 % 50 mL IVPB     2 g 100 mL/hr over 30 Minutes Intravenous  Once 07/01/17 2328 07/02/17 0015       Objective   Vitals:   07/03/17 2107 07/03/17 2201 07/04/17 0603 07/04/17 1336  BP: (!) 143/83 (!) 111/53 (!) 107/59 114/61  Pulse: 80 72 64 67  Resp: 20 20 20 14   Temp: 98.3 F (36.8 C) 99.7 F (37.6 C) 98.2 F (36.8 C) 98.2 F (36.8 C)  TempSrc: Oral Oral Oral Oral  SpO2: 99% 96% 96% 98%  Weight:   86.6 kg (190 lb 14.7 oz)   Height:        Intake/Output Summary (Last 24 hours) at 07/04/17 1628 Last data filed at 07/04/17 1300  Gross per 24 hour  Intake               50 ml  Output  1450 ml  Net            -1400 ml   Filed Weights   07/02/17 0900 07/03/17 0500 07/04/17 0603  Weight: 85.1 kg (187 lb 9.8 oz) 85.5 kg (188 lb 6.4 oz) 86.6 kg (190 lb 14.7 oz)     Physical Examination:   Physical Exam: Eyes: No icterus, extraocular muscles intact  Mouth: Oral mucosa is moist, no lesions on palate,  Neck: Supple, no deformities, masses, or tenderness Lungs: Normal respiratory effort, bilateral clear to auscultation, no crackles or wheezes.  Heart: Regular rate and rhythm, S1 and S2 normal, no murmurs, rubs auscultated Abdomen: BS normoactive,soft,nondistended,non-tender to palpation,no  organomegaly Extremities: No pretibial edema, no erythema, no cyanosis, no clubbing Neuro : Alert and oriented to time, place and person, No focal deficits Skin: No rashes seen on exam  Data Reviewed: I have personally reviewed following labs and imaging studies  CBG: No results for input(s): GLUCAP in the last 168 hours.  CBC:  Recent Labs Lab 07/01/17 2241 07/02/17 0456 07/03/17 0826 07/04/17 0559  WBC 0.1* 0.1* 0.3* 0.9*  NEUTROABS 0.0*  --  0.0* 0.3*  HGB 8.9* 8.9* 8.2* 8.3*  HCT 26.2* 26.2* 23.5* 24.5*  MCV 82.9 83.4 82.2 82.5  PLT 75* 68* 67* 76*    Basic Metabolic Panel:  Recent Labs Lab 07/01/17 2241 07/02/17 0456 07/03/17 0826 07/04/17 0559  NA 131* 130* 126* 128*  K 3.8 4.1 3.8 3.5  CL 95* 95* 90* 93*  CO2 27 27 26 26   GLUCOSE 111* 129* 109* 88  BUN 10 9 10 10   CREATININE 0.47* 0.53* 0.51* 0.49*  CALCIUM 8.6* 8.5* 8.4* 8.4*    Recent Results (from the past 240 hour(s))  Culture, blood (Routine x 2)     Status: None (Preliminary result)   Collection Time: 07/01/17 10:47 PM  Result Value Ref Range Status   Specimen Description BLOOD BLOOD RIGHT FOREARM  Final   Special Requests   Final    BOTTLES DRAWN AEROBIC AND ANAEROBIC Blood Culture adequate volume   Culture   Final    NO GROWTH 2 DAYS Performed at [facility], [address], [place], [place] [address]    Report Status PENDING  Incomplete  Culture, blood (Routine x 2)     Status: None (Preliminary result)   Collection Time: 07/01/17 11:03 PM  Result Value Ref Range Status   Specimen Description BLOOD BLOOD LEFT FOREARM  Final   Special Requests   Final    BOTTLES DRAWN AEROBIC AND ANAEROBIC Blood Culture adequate volume   Culture   Final    NO GROWTH 2 DAYS Performed at [facility], [place] [address], [place], [place] [address]    Report Status PENDING  Incomplete  Respiratory Panel by PCR     Status: None   Collection Time: 07/02/17  2:40 AM  Result Value Ref Range Status    Adenovirus NOT DETECTED NOT DETECTED Final   Coronavirus 229E NOT DETECTED NOT DETECTED Final   Coronavirus HKU1 NOT DETECTED NOT DETECTED Final   Coronavirus NL63 NOT DETECTED NOT DETECTED Final   Coronavirus OC43 NOT DETECTED NOT DETECTED Final   Metapneumovirus NOT DETECTED NOT DETECTED Final   Rhinovirus / Enterovirus NOT DETECTED NOT DETECTED Final   Influenza A NOT DETECTED NOT DETECTED Final   Influenza B NOT DETECTED NOT DETECTED Final   Parainfluenza Virus 1 NOT DETECTED NOT DETECTED Final   Parainfluenza Virus 2 NOT DETECTED  NOT DETECTED Final   Parainfluenza Virus 3 NOT DETECTED NOT DETECTED Final   Parainfluenza Virus 4 NOT DETECTED NOT DETECTED Final   Respiratory Syncytial Virus NOT DETECTED NOT DETECTED Final   Bordetella pertussis NOT DETECTED NOT DETECTED Final   Chlamydophila pneumoniae NOT DETECTED NOT DETECTED Final   Mycoplasma pneumoniae NOT DETECTED NOT DETECTED Final    Comment: Performed at [facility], [place] [address] [place], [place] [address]  Urine culture     Status: Abnormal   Collection Time: 07/02/17  4:56 AM  Result Value Ref Range Status   Specimen Description URINE, CLEAN CATCH  Final   Special Requests NONE  Final   Culture (A)  Final    >=100,000 COLONIES/mL STAPHYLOCOCCUS SPECIES (COAGULASE NEGATIVE)   Report Status 07/04/2017 FINAL  Final   Organism ID, Bacteria STAPHYLOCOCCUS SPECIES (COAGULASE NEGATIVE) (A)  Final      Susceptibility   Staphylococcus species (coagulase negative) - MIC*    CIPROFLOXACIN <=0.5 SENSITIVE Sensitive     GENTAMICIN <=0.5 SENSITIVE Sensitive     NITROFURANTOIN <=16 SENSITIVE Sensitive     OXACILLIN <=0.25 SENSITIVE Sensitive     TETRACYCLINE 2 SENSITIVE Sensitive     VANCOMYCIN 2 SENSITIVE Sensitive     TRIMETH/SULFA <=10 SENSITIVE Sensitive     CLINDAMYCIN <=0.25 SENSITIVE Sensitive     RIFAMPIN <=0.5 SENSITIVE Sensitive     Inducible Clindamycin NEGATIVE Sensitive     * >=100,000 COLONIES/mL  STAPHYLOCOCCUS SPECIES (COAGULASE NEGATIVE)     Liver Function Tests:  Recent Labs Lab 07/01/17 2241 07/02/17 0456 07/03/17 0826 07/04/17 0559  AST 24 24 18 29   ALT 18 18 14* 24  ALKPHOS 42 36* 35* 40  BILITOT 1.3* 1.3* 1.0 1.3*  PROT 5.9* 5.7* 5.6* 5.6*  ALBUMIN 3.3* 3.1* 2.7* 2.7*   No results for input(s): LIPASE, AMYLASE in the last 168 hours. No results for input(s): AMMONIA in the last 168 hours.  Cardiac Enzymes:  Recent Labs Lab 07/02/17 0242 07/02/17 0912 07/02/17 1502  TROPONINI <0.03 <0.03 <0.03   BNP (last 3 results) No results for input(s): BNP in the last 8760 hours.  ProBNP (last 3 results) No results for input(s): PROBNP in the last 8760 hours.    Studies: No results found.  Scheduled Meds: . docusate sodium  200 mg Oral Daily  . folic acid  1 mg Oral Daily  . ondansetron  8 mg Oral UD  . polyethylene glycol  17 g Oral Daily  . pravastatin  80 mg Oral QPM  . rivaroxaban  20 mg Oral Q supper  . Tbo-Filgrastim  480 mcg Subcutaneous Daily  . thiamine  100 mg Oral Daily      Time spent: 25 min  [facility] Pager [phone]. If 7PM-7AM, please contact night-coverage at [email or web address], Office  [phone]  password [email or web address]  07/04/2017, 4:28 PM  LOS: 2 days

## 2017-07-05 LAB — BASIC METABOLIC PANEL
Anion gap: 10 (ref 5–15)
BUN: 10 mg/dL (ref 6–20)
CHLORIDE: 94 mmol/L — AB (ref 101–111)
CO2: 26 mmol/L (ref 22–32)
CREATININE: 0.44 mg/dL — AB (ref 0.61–1.24)
Calcium: 8.7 mg/dL — ABNORMAL LOW (ref 8.9–10.3)
GFR calc non Af Amer: >60 mL/min (ref >60)
Glucose, Bld: 93 mg/dL (ref 65–99)
Potassium: 3.5 mmol/L (ref 3.5–5.1)
Sodium: 130 mmol/L — ABNORMAL LOW (ref 135–145)

## 2017-07-05 LAB — CBC WITH DIFFERENTIAL/PLATELET
BAND NEUTROPHILS: 54 %
BASOS PCT: 0 %
Basophils Absolute: 0 10*3/uL (ref 0.0–0.1)
Blasts: 0 %
Eosinophils Absolute: 0 10*3/uL (ref 0.0–0.7)
Eosinophils Relative: 0 %
HEMATOCRIT: 24.6 % — AB (ref 39.0–52.0)
HEMOGLOBIN: 8.3 g/dL — AB (ref 13.0–17.0)
LYMPHS ABS: 0.3 10*3/uL — AB (ref 0.7–4.0)
Lymphocytes Relative: 7 %
MCH: 27.5 pg (ref 26.0–34.0)
MCHC: 33.7 g/dL (ref 30.0–36.0)
MCV: 81.5 fL (ref 78.0–100.0)
METAMYELOCYTES PCT: 2 %
MONO ABS: 0.6 10*3/uL (ref 0.1–1.0)
MYELOCYTES: 2 %
Monocytes Relative: 15 %
Neutro Abs: 0.8 10*3/uL — ABNORMAL LOW (ref 1.7–7.7)
Neutrophils Relative %: 20 %
OTHER: 0 %
Platelets: 84 10*3/uL — ABNORMAL LOW (ref 150–400)
Promyelocytes Absolute: 0 %
RBC: 3.02 MIL/uL — AB (ref 4.22–5.81)
RDW: 19.9 % — ABNORMAL HIGH (ref 11.5–15.5)
WBC Morphology: INCREASED
WBC: 3.8 10*3/uL — ABNORMAL LOW (ref 4.0–10.5)
nRBC: 0 /100 WBC

## 2017-07-05 MED ORDER — CIPROFLOXACIN HCL 500 MG PO TABS: 500.0000 mg | ORAL_TABLET | Freq: Two times a day (BID) | ORAL | 0 refills | Status: AC

## 2017-07-05 NOTE — Progress Notes (Signed)
Pharmacy Antibiotic Note  Kevin Martin is a 66 y.o. male with fever and weakness admitted on 07/01/2017 with febrile neutropenia. Subjective fever reported at home.  Pharmacy has been consulted for cefepime dosing.  Granix ordered daily until ANC>1000.  Day #4 Cefepime.  ANC improving. SCr stable. Remains afebrile this admission.  Plan: Continue Cefepime 2g IV q8h.  Dosage remains stable and need for further dosage adjustment appears unlikely at present.  Based on sensitivities of coag neg staph urine culture (pan-sensitive), could consider transition to Keflex for continued treatment at discharge. Will sign off at this time.  Please reconsult if a change in clinical status warrants re-evaluation of dosage.  Height: 6\' 1"  (185.4 cm) Weight: 186 lb (84.4 kg) IBW/kg (Calculated) : 79.9  Temp (24hrs), Avg:98 F (36.7 C), Min:97.6 F (36.4 C), Max:98.3 F (36.8 C)  Recent Labs  Lab 07/01/17 2241 07/01/17 2301 07/02/17 0456 07/03/17 0826 07/04/17 0559 07/05/17 0601  WBC 0.1*  --  0.1* 0.3* 0.9* 3.8*  CREATININE 0.47*  --  0.53* 0.51* 0.49* 0.44*  LATICACIDVEN  --  1.14  --   --   --   --     Estimated Creatinine Clearance: 102.6 mL/min (A) (by C-G formula based on SCr of 0.44 mg/dL (L)).    No Known Allergies  Antimicrobials this admission: 10/31 cefepime >>  11/1 vancomycin >> 11/3  Dose adjustments this admission:  Microbiology results: 11/1 BCx: ngtd 11/1 UCx: >100k coag neg staph (pan-sensitive) 11/1 Respiratory panel: neg  Thank you for allowing pharmacy to be a part of this patient's care.  Hershal Coria 07/05/2017 1:05 PM

## 2017-07-05 NOTE — Progress Notes (Signed)
Patient discharged home with family, discharge instructions/precription given and explained to patient/family and they verbalized understanding, patient denies any pain/distress. Skin intact, no wound noted. Accompanied home by family.

## 2017-07-05 NOTE — Discharge Summary (Signed)
Physician Discharge Summary  Kevin Martin FAO:[id number] DOB: 18-Dec-1950 DOA: 07/01/2017  PCP: Berkley Harvey, NP  Admit date: 07/01/2017 Discharge date: 07/05/2017  Time spent: 25* minutes  Recommendations for Outpatient Follow-up:  1. Follow up PCP in 1 week 2. 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 Pt presented this evening due to fever    Hospital Course:  She 1. Febrile neutropenia-resolved.  Patient was started on empiric antibiotics vancomycin and cefepime.  WBC slowly improving today total WBC 3.8, neutrophils 20%, monocytes 15%.  Patient was started on Granix 480 mcg subcu daily.  He also received 1 dose this morning. 2. UTI-urine culture is positive, greater than 100,000 colonies per mL gram-positive cocci.    Coagulase negative staph.  Vancomycin has been discontinued.    Will discharge patient on Cipro 500 twice daily for 6 more days. 3. Squamous cell carcinoma of the hypopharynx-patient is receiving chemotherapy at [facility].  CT of the soft tissue neck showed diffuse swelling of the hypopharyngeal mucosa/epiglottis and aryepiglottic folds.  Findings represent posttreatment changes.  No fluid collection.  Discussed with radiation oncology. 4. Hyponatremia-sodium is 130 today, slowly improving.  Likely from dehydration.  5. Hypertension-blood pressure stable, continue to hold antihypertensive  medications. 6. Hyperlipidemia-continue pravastatin. 7. Paroxysmal atrial fibrillation-heart rate is controlled, continue Xarelto.  Switch     Procedures:  None question of  Consultations:  None please  Discharge Exam: He has Vitals:   07/05/17 0523 07/05/17 1242  BP: (!) 129/52 (!) 111/55  Pulse: 76 71  Resp: 17 18  Temp: 97.6 F (36.4 C) 97.7 F (36.5 C)  SpO2: 99% 100%    General: Appears in no acute distress Cardiovascular: S1S2 RRR Respiratory: Clear bilaterally  Discharge Instructions   Discharge Instructions    Diet - low sodium heart healthy   Complete by:  As directed    Increase activity slowly   Complete by:  As directed      Current Discharge Medication List    START taking these medications   Details  ciprofloxacin (CIPRO) 500 MG tablet Take 1 tablet (500 mg total) 2 (two) times daily for 6 days by mouth. Qty: 12 tablet, Refills: 0      CONTINUE these medications which have NOT CHANGED   Details  acetaminophen (TYLENOL) 500 MG tablet Take 1,000 mg by mouth every 6 (six) hours as needed for mild pain.    amLODipine (NORVASC) 10 MG tablet Take 1 tablet (10 mg total) by mouth daily. Qty: 30 tablet, Refills: 2    dexamethasone (DECADRON) 4 MG tablet Take 8 mg by mouth daily. Take for 2 days after chemo    docusate sodium (COLACE) 100 MG capsule Take 200 mg by mouth daily.     DPH-Lido-AlHydr-MgHydr-Simeth (FIRST-MOUTHWASH BLM) SUSP Use as directed 5 mLs in the mouth or throat every 6 (six) hours as needed (throat pain).    folic acid (FOLVITE) 696 MCG tablet  Take 800 mcg by mouth daily.     furosemide (LASIX) 40 MG tablet Take 40 mg by mouth daily.     HYDROcodone-acetaminophen (NORCO) 10-325 MG tablet Take 1 tablet by mouth every 6 (six) hours as needed for moderate pain or severe pain.    lisinopril (PRINIVIL,ZESTRIL) 40 MG tablet Take 1 tablet (40 mg total) by mouth daily. Qty: 30 tablet, Refills: 2    ondansetron (ZOFRAN) 8 MG  tablet Take 8 mg by mouth as directed. Take 8mg  by mouth twice daily for 2 days after chemo then every 8 hours as needed for nausea    polyethylene glycol (MIRALAX / GLYCOLAX) packet Take 17 g by mouth daily.     pravastatin (PRAVACHOL) 80 MG tablet Take 1 tablet (80 mg total) by mouth daily. Qty: 30 tablet, Refills: 2    rivaroxaban (XARELTO) 20 MG TABS tablet Take 1 tablet (20 mg total) by mouth daily with supper. Qty: 90 tablet, Refills: 3    thiamine 100 MG tablet Take 1 tablet (100 mg total) by mouth daily. Qty: 30 tablet, Refills: 0    lidocaine (XYLOCAINE) 2 % solution Patient: Mix 1part 2% viscous lidocaine, 1part H20. Swish and swallow 36mL of this mixture, 39min before meals and at bedtime, up to QID Qty: 100 mL, Refills: 5   Associated Diagnoses: Cancer of supraglottis (HCC)    sucralfate (CARAFATE) 1 g tablet Dissolve 1 tablet in 10 mL H20 and swallow up to QID for sore throat. 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    Significant Diagnostic Studies: Dg Chest 2 View  Result Date: 07/01/2017 CLINICAL DATA:  Fever and shortness of breath. History of lung cancer. EXAM: CHEST  2 VIEW COMPARISON:  PET CT 03/23/2017 FINDINGS: Normal cardiomediastinal contours. No focal airspace consolidation or pulmonary edema. There are scattered nodular opacities within both lungs that likely correlates to the numerous metastases identified on prior CT, but are poorly characterized on this radiograph. No pleural effusion or pneumothorax. Lung inflation is normal. Three circular metallic densities are buttons on the patient's sure. IMPRESSION: 1. No active cardiopulmonary disease. 2. Scattered nodular opacities are nonspecific but probably correlate to the nodular  metastases identified on prior CT. Electronically Signed   By: Ulyses Jarred M.D.   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 EXAM: CT NECK WITH CONTRAST TECHNIQUE: Multidetector CT imaging of the neck was performed using the standard protocol following the bolus administration of intravenous contrast. CONTRAST:  37mL ISOVUE-300 IOPAMIDOL (ISOVUE-300) INJECTION 61% COMPARISON:  09/03/2016 CT of the neck.  03/23/2017 PET-CT. FINDINGS: Pharynx and larynx: Diffuse swelling of the hypopharyngeal mucosa, epiglottis, and aryepiglottic folds. No fluid collection. No exophytic mass. Stable dystrophic calcification of the epiglottis. Salivary glands: No inflammation, mass, or stone. Thyroid: Normal. Lymph nodes: None enlarged or abnormal density. Vascular: Dense calcific atherosclerosis of the carotid bifurcations. Limited intracranial: Negative. Visualized orbits: Negative. Mastoids and visualized paranasal sinuses: Clear. Skeleton: Stable grade 1 C3-4 anterolisthesis and moderate cervical spondylosis predominantly at the C4-C6 levels. Disc and facet disease insert neural foramen bilaterally greatest at  the right C3-4 level. No high-grade bony canal stenosis. Upper chest: 12 mm right upper lobe nodule may be slightly increased in size from prior study or represent confluence of 2 prior pulmonary nodules on PET-CT. Otherwise multiple pulmonary nodules in lung apices are overall decreased in size. Other: None. IMPRESSION: 1. Diffuse swelling of hypopharyngeal mucosa, epiglottis, and aryepiglottic folds. No fluid collection. Findings probably represent posttreatment changes. Infectious pharyngitis is also possible. 2. No recurrent hypopharyngeal mass identified. No cervical lymphadenopathy. 3. Single  right upper lobe nodule may be slightly increased in size from prior PET-CT or represent confluence of 2 prior nodules. Otherwise pulmonary nodules in the lung apices are overall decreased in size. Electronically Signed   By: Kristine Garbe M.D.   On: 07/02/2017 14:59    Microbiology: Recent Results (from the past 240 hour(s))  Culture, blood (Routine x 2)     Status: None (Preliminary result)   Collection Time: 07/01/17 10:47 PM  Result Value Ref Range Status   Specimen Description BLOOD BLOOD RIGHT FOREARM  Final   Special Requests   Final    BOTTLES DRAWN AEROBIC AND ANAEROBIC Blood Culture adequate volume   Culture   Final    NO GROWTH 2 DAYS Performed at [facility], [address] [place], [place] [address]    Report Status PENDING  Incomplete  Culture, blood (Routine x 2)     Status: None (Preliminary result)   Collection Time: 07/01/17 11:03 PM  Result Value Ref Range Status   Specimen Description BLOOD BLOOD LEFT FOREARM  Final   Special Requests   Final    BOTTLES DRAWN AEROBIC AND ANAEROBIC Blood Culture adequate volume   Culture   Final    NO GROWTH 2 DAYS Performed at [facility], [place] [address], [place], [place] [address]    Report Status PENDING  Incomplete  Respiratory Panel by PCR     Status: None   Collection Time: 07/02/17  2:40 AM  Result Value Ref Range Status   Adenovirus NOT DETECTED NOT DETECTED Final   Coronavirus 229E NOT DETECTED NOT DETECTED Final   Coronavirus HKU1 NOT DETECTED NOT DETECTED Final   Coronavirus NL63 NOT DETECTED NOT DETECTED Final   Coronavirus OC43 NOT DETECTED NOT DETECTED Final   Metapneumovirus NOT DETECTED NOT DETECTED Final   Rhinovirus / Enterovirus NOT DETECTED NOT DETECTED Final   Influenza A NOT DETECTED NOT DETECTED Final   Influenza B NOT DETECTED NOT DETECTED Final   Parainfluenza Virus 1 NOT DETECTED NOT DETECTED Final   Parainfluenza Virus 2 NOT DETECTED NOT DETECTED Final   Parainfluenza  Virus 3 NOT DETECTED NOT DETECTED Final   Parainfluenza Virus 4 NOT DETECTED NOT DETECTED Final   Respiratory Syncytial Virus NOT DETECTED NOT DETECTED Final   Bordetella pertussis NOT DETECTED NOT DETECTED Final   Chlamydophila pneumoniae NOT DETECTED NOT DETECTED Final   Mycoplasma pneumoniae NOT DETECTED NOT DETECTED Final    Comment: Performed at [facility], [place] [address] [place], [place] [address]  Urine culture     Status: Abnormal   Collection Time: 07/02/17  4:56 AM  Result Value Ref Range Status   Specimen Description URINE, CLEAN CATCH  Final   Special Requests NONE  Final   Culture (A)  Final    >=100,000 COLONIES/mL STAPHYLOCOCCUS SPECIES (COAGULASE NEGATIVE)   Report Status 07/04/2017 FINAL  Final   Organism ID, Bacteria STAPHYLOCOCCUS SPECIES (COAGULASE NEGATIVE) (A)  Final  Susceptibility   Staphylococcus species (coagulase negative) - MIC*    CIPROFLOXACIN <=0.5 SENSITIVE Sensitive     GENTAMICIN <=0.5 SENSITIVE Sensitive     NITROFURANTOIN <=16 SENSITIVE Sensitive     OXACILLIN <=0.25 SENSITIVE Sensitive     TETRACYCLINE 2 SENSITIVE Sensitive     VANCOMYCIN 2 SENSITIVE Sensitive     TRIMETH/SULFA <=10 SENSITIVE Sensitive     CLINDAMYCIN <=0.25 SENSITIVE Sensitive     RIFAMPIN <=0.5 SENSITIVE Sensitive     Inducible Clindamycin NEGATIVE Sensitive     * >=100,000 COLONIES/mL STAPHYLOCOCCUS SPECIES (COAGULASE NEGATIVE)     Labs: Basic Metabolic Panel: Recent Labs  Lab 07/01/17 2241 07/02/17 0456 07/03/17 0826 07/04/17 0559 07/05/17 0601  NA 131* 130* 126* 128* 130*  K 3.8 4.1 3.8 3.5 3.5  CL 95* 95* 90* 93* 94*  CO2 27 27 26 26 26   GLUCOSE 111* 129* 109* 88 93  BUN 10 9 10 10 10   CREATININE 0.47* 0.53* 0.51* 0.49* 0.44*  CALCIUM 8.6* 8.5* 8.4* 8.4* 8.7*   Liver Function Tests: Recent Labs  Lab 07/01/17 2241 07/02/17 0456 07/03/17 0826 07/04/17 0559  AST 24 24 18 29   ALT 18 18 14* 24  ALKPHOS 42 36* 35* 40  BILITOT 1.3* 1.3* 1.0  1.3*  PROT 5.9* 5.7* 5.6* 5.6*  ALBUMIN 3.3* 3.1* 2.7* 2.7*   No results for input(s): LIPASE, AMYLASE in the last 168 hours. No results for input(s): AMMONIA in the last 168 hours. CBC: Recent Labs  Lab 07/01/17 2241 07/02/17 0456 07/03/17 0826 07/04/17 0559 07/05/17 0601  WBC 0.1* 0.1* 0.3* 0.9* 3.8*  NEUTROABS 0.0*  --  0.0* 0.3* 0.8*  HGB 8.9* 8.9* 8.2* 8.3* 8.3*  HCT 26.2* 26.2* 23.5* 24.5* 24.6*  MCV 82.9 83.4 82.2 82.5 81.5  PLT 75* 68* 67* 76* 84*   Cardiac Enzymes: Recent Labs  Lab 07/02/17 0242 07/02/17 0912 07/02/17 1502  TROPONINI <0.03 <0.03 <0.03   BNP:  I have signed:  Oswald Hillock MD.  [facility] 07/05/2017, 2:05 PM

## 2017-07-07 LAB — CULTURE, BLOOD (ROUTINE X 2)
CULTURE: NO GROWTH
Culture: NO GROWTH
SPECIAL REQUESTS: ADEQUATE
Special Requests: ADEQUATE

## 2017-07-17 ENCOUNTER — Encounter (INDEPENDENT_AMBULATORY_CARE_PROVIDER_SITE_OTHER): Payer: Self-pay | Admitting: Orthopaedic Surgery

## 2017-07-17 ENCOUNTER — Ambulatory Visit (INDEPENDENT_AMBULATORY_CARE_PROVIDER_SITE_OTHER): Payer: Medicare Other | Admitting: Orthopaedic Surgery

## 2017-07-17 ENCOUNTER — Ambulatory Visit (INDEPENDENT_AMBULATORY_CARE_PROVIDER_SITE_OTHER): Payer: Medicare Other

## 2017-07-17 DIAGNOSIS — S52532A Colles' fracture of left radius, initial encounter for closed fracture: Secondary | ICD-10-CM

## 2017-07-17 NOTE — Progress Notes (Addendum)
Office Visit Note   Patient: Kevin Martin           Date of Birth: 1950/09/17           MRN: [id number] Visit Date: 07/17/2017              Requested by: Berkley Harvey, NP [address] [place], [place] [address] PCP: Berkley Harvey, NP   Assessment & Plan: Visit Diagnoses:  1. Closed Colles' fracture of left radius, initial encounter     Plan: Impression is minimally displaced left distal radius fracture that should be amenable to nonoperative treatment.  Short arm cast nonweightbearing follow-up in 1 week with repeat 2 view x-rays of the left wrist in the cast.  Follow-Up Instructions: Return in about 1 week (around 07/24/2017).   Orders:  Orders Placed This Encounter  Procedures  . XR Wrist Complete Left   No orders of the defined types were placed in this encounter.     Procedures: No procedures performed   Clinical Data: No additional findings.   Subjective: Chief Complaint  Patient presents with  . Left Wrist - Pain     Patient is a 66 year old gentleman who sustained a left distal radius fracture from a mechanical fall about 3 days ago.  He is currently on chemotherapy for lung cancer.  He is a low demand gentleman.  He complains of moderate pain in the left wrist that does not radiate and denies any numbness and tingling.  The pain is worse with use of the wrist.    Review of Systems  Constitutional: Negative.   All other systems reviewed and are negative.    Objective: Vital Signs: There were no vitals taken for this visit.  Physical Exam  Constitutional: He is oriented to person, place, and time. He appears well-developed and well-nourished.  HENT:  Head: Normocephalic and atraumatic.  Eyes: Pupils are equal, round, and reactive to light.  Neck: Neck supple.  Pulmonary/Chest: Effort normal.  Abdominal: Soft.  Musculoskeletal: Normal range of motion.  Neurological: He is alert and oriented to person, place, and time.  Skin: Skin is  warm.  Psychiatric: He has a normal mood and affect. His behavior is normal. Judgment and thought content normal.  Nursing note and vitals reviewed.   Ortho Exam Left wrist exam shows moderate swelling on the dorsum.  No neurovascular compromise of the hand.  Impression is Specialty Comments:  No specialty comments available.  Imaging: Xr Wrist Complete Left  Result Date: 07/17/2017 Mild dorsal angulation extra-articular distal radius fracture    PMFS History: Patient Active Problem List   Diagnosis Date Noted  . Fever 07/02/2017  . Neutropenia ([place]) 07/02/2017  . Febrile neutropenia ([place]) 07/02/2017  . 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 Goals of care, counseling/discussion 02/12/2017  . Multiple lung nodules on CT 10/10/2016  . Alcoholism ([place]) 10/10/2016  . 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 Chronic atrial fibrillation ([place]) 02/14/2015  . Chronic diastolic CHF (congestive heart failure) ([place]) 02/14/2015  . Chronic diastolic heart failure ([place]) 02/14/2015  . Prediabetes 01/23/2015  . Tobacco abuse 01/23/2015  . Acute on chronic congestive heart failure ([place]) 01/15/2015  . Acute congestive heart failure with left ventricular diastolic dysfunction ([place]) 01/14/2015  . Alcohol abuse, daily use 01/14/2015  . Acute respiratory failure ([place]) 01/14/2015  . Hyponatremia 01/14/2015  . Acute right heart failure ([place]) 01/14/2015  . Congestive heart disease ([place])   . Right heart failure ([place])  01/03/2015  . Cardiomegaly 01/03/2015  . Atrial fibrillation, controlled ([place]) 01/03/2015  . Moderate aortic stenosis 01/03/2015  . Chronic anticoagulation 01/03/2015  . Hypertension 01/03/2015  . Dyspnea 01/03/2015  . A-fib ([place]) 02/17/2014  . Long term current use of anticoagulant 02/17/2014  . Essential (primary) hypertension 11/01/2013  . Adult BMI 30+ 10/21/2013  . Adiposity 04/22/2013  . H/O cardiovascular disorder 04/22/2013  . Headache, tension-type 02/05/2013    Past Medical History:  Diagnosis Date  . Aortic stenosis    moderate by 12/16/15 echo  . Atrial fibrillation ([place])   . Cardiomegaly   . CHF (congestive heart failure) ([place])   . History of radiation therapy 10/23/16- 12/11/16   Laryngopharynx and bilateral neck, 70 Gy in 35 fractions.   . Hypertension   . Shortness of breath dyspnea     Family History  Problem Relation Age of Onset  . Cancer Mother   . Cancer Father   . Stroke Maternal Grandfather   . Heart attack Neg Hx     Past Surgical History:  Procedure Laterality Date  . BRONCHOSCOPY  03/16/2017   performed at [facility]   . COLONOSCOPY    . DIRECT LARYNGOSCOPY N/A 09/19/2016   Procedure: DIRECT LARYNGOSCOPY with BIOPSY;  Surgeon: Izora Gala, MD;  Location: [facility];  Service: ENT;  Laterality: N/A;  . ESOPHAGOSCOPY N/A 09/19/2016   Procedure: ESOPHAGOSCOPY;  Surgeon: Izora Gala, MD;  Location: [facility];  Service: ENT;  Laterality: N/A;  . FRACTURE SURGERY Right    right arm  . NECK SURGERY     Social History   Occupational History  . Occupation: retired Nurse, mental health  . Smoking status: Former Smoker    Years: 10.00    Types: Cigars, Cigarettes    Last attempt to quit: 09/01/2016    Years since quitting: 0.8  . Smokeless tobacco: Never Used  . Tobacco comment: quit cigarette smoking 10 years ago, smokes 5 cigars a day ([employer]isher sweets)  Substance and Sexual Activity  . Alcohol use: Yes    Alcohol/week: 0.0 oz    Comment: every day about 2 mixed drinks.   . Drug use: No  . Sexual activity: Not on file

## 2017-07-27 ENCOUNTER — Ambulatory Visit (INDEPENDENT_AMBULATORY_CARE_PROVIDER_SITE_OTHER): Payer: Medicare Other | Admitting: Orthopaedic Surgery

## 2017-07-27 ENCOUNTER — Ambulatory Visit (INDEPENDENT_AMBULATORY_CARE_PROVIDER_SITE_OTHER): Payer: Medicare Other

## 2017-07-27 DIAGNOSIS — S52532D Colles' fracture of left radius, subsequent encounter for closed fracture with routine healing: Secondary | ICD-10-CM

## 2017-07-27 NOTE — Progress Notes (Signed)
Patient is 1 week status post extra-articular distal radius fracture.  He is doing well and tolerating the cast.  Cast is well fitting.  His hand is warm well perfused.  X-rays show stable alignment of the fracture.  We will plan on treating the fracture in a cast for 3 more weeks.  Continue nonweightbearing.  Follow-up at that time with 2 view x-rays of the left wrist out of the cast.

## 2017-08-05 NOTE — Telephone Encounter (Signed)
error 

## 2017-08-17 ENCOUNTER — Ambulatory Visit (INDEPENDENT_AMBULATORY_CARE_PROVIDER_SITE_OTHER): Payer: Medicare Other

## 2017-08-17 ENCOUNTER — Encounter (INDEPENDENT_AMBULATORY_CARE_PROVIDER_SITE_OTHER): Payer: Self-pay | Admitting: Orthopaedic Surgery

## 2017-08-17 ENCOUNTER — Ambulatory Visit (INDEPENDENT_AMBULATORY_CARE_PROVIDER_SITE_OTHER): Payer: Medicare Other | Admitting: Orthopaedic Surgery

## 2017-08-17 DIAGNOSIS — S52532D Colles' fracture of left radius, subsequent encounter for closed fracture with routine healing: Secondary | ICD-10-CM | POA: Diagnosis not present

## 2017-08-17 NOTE — Progress Notes (Signed)
Patient is 4-week status post extra-articular distal radius fracture.  He is doing well.  He denies any pain.  His physical exam is benign.  His x-rays do show some slight interval collapse and to dorsal angulation but the overall alignment is acceptable.  At this point discontinue cast and we applied a wrist brace.  Begin OT for range of motion and strengthening.  Follow-up in 4 weeks with repeat 2 view x-rays of the left wrist.

## 2017-08-26 ENCOUNTER — Ambulatory Visit: Payer: Medicare Other | Attending: Radiation Oncology | Admitting: Occupational Therapy

## 2017-08-26 DIAGNOSIS — M6281 Muscle weakness (generalized): Secondary | ICD-10-CM | POA: Diagnosis present

## 2017-08-26 DIAGNOSIS — M25532 Pain in left wrist: Secondary | ICD-10-CM | POA: Insufficient documentation

## 2017-08-26 DIAGNOSIS — M25632 Stiffness of left wrist, not elsewhere classified: Secondary | ICD-10-CM | POA: Insufficient documentation

## 2017-08-26 DIAGNOSIS — R278 Other lack of coordination: Secondary | ICD-10-CM | POA: Diagnosis present

## 2017-08-26 NOTE — Patient Instructions (Addendum)
AROM: Wrist Extension   .  With _left___ palm down, bend wrist up. Repeat __15__ times per set.  Do __4-6__ sessions per day.    AROM: Wrist Flexion   With__left___ palm up, bend wrist up. Repeat __15__ times per set.  Do _4-6___ sessions per day.   AROM: Forearm Pronation / Supination   With left____ arm in handshake position, slowly rotate palm down until stretch is felt. Relax. Then rotate palm up until stretch is felt. Repeat _15___ times per set. Do _4-6___ sessions per day.  Copyright  VHI. All rights reserved.       AROM: Finger Flexion / Extension   Actively bend fingers of  hand. Start with knuckles furthest from palm, and slowly make a fist. Hold __5__ seconds. Relax. Then straighten fingers as far as possible. Repeat _10-15___ times per set.  Do _4-6___ sessions per day.  Copyright  VHI. All rights reserved.   Opposition (Active)   Touch tip of thumb to nail tip of each finger in turn, making an "O" shape. Repeat __10__ times. Do _4-6___ sessions per day.   MP Flexion (Active)   Bend thumb to touch base of little finger, keeping tip joint straight. Repeat __10-15__ times. Do _4-6___ sessions per day.       IP Flexion (Active Blocked)   Brace thumb below tip joint. Bend joint as far as possible. Repeat __10__ times. Do _4-6___ sessions per day.

## 2017-08-26 NOTE — Therapy (Signed)
[facility] [address] [place], [place], [address] Phone: [phone]   Fax:  [phone]  Occupational Therapy Evaluation  Patient Details  Name: Kevin Martin MRN: [id number] Date of Birth: 25-Oct-1950 No Data Recorded  Encounter Date: 08/26/2017  OT End of Session - 08/26/17 0929    Visit Number  1    Number of Visits  17    Date for OT Re-Evaluation  10/25/17    Authorization Type  Medicare    Authorization - Visit Number  1    Authorization - Number of Visits  10    OT Start Time  0840    OT Stop Time  0915    OT Time Calculation (min)  35 min    Behavior During Therapy  [facility] for tasks assessed/performed       Past Medical History:  Diagnosis Date  . Aortic stenosis    moderate by 12/16/15 echo  . Atrial fibrillation ([place])   . Cardiomegaly   . CHF (congestive heart failure) ([place])   . History of radiation therapy 10/23/16- 12/11/16   Laryngopharynx and bilateral neck, 70 Gy in 35 fractions.   . Hypertension   . Shortness of breath dyspnea     Past Surgical History:  Procedure Laterality Date  . BRONCHOSCOPY  03/16/2017   performed at [facility]   . COLONOSCOPY    . DIRECT LARYNGOSCOPY N/A 09/19/2016   Procedure: DIRECT LARYNGOSCOPY with BIOPSY;  Surgeon: Izora Gala, MD;  Location: [facility];  Service: ENT;  Laterality: N/A;  . ESOPHAGOSCOPY N/A 09/19/2016   Procedure: ESOPHAGOSCOPY;  Surgeon: Izora Gala, MD;  Location: [facility];  Service: ENT;  Laterality: N/A;  . FRACTURE SURGERY Right    right arm  . NECK SURGERY      There were no vitals filed for this visit.  Subjective Assessment - 08/26/17 0847    Subjective   Pt s/p fall sustained a distal radius fx    Patient Stated Goals  regain use of LUE    Currently in Pain?  Yes    Pain Score  3     Pain Location  Wrist    Pain Orientation  Left    Pain Descriptors / Indicators  Aching    Pain Type  Acute pain    Aggravating Factors   movement    Pain  Relieving Factors  hydrocodone at night    Multiple Pain Sites  No        [facility] OT Assessment - 08/26/17 0849      Assessment   Medical Diagnosis  LUE distal radius fx    Referring Provider  Dr. Eliot Ford Dominance  Left    Prior Therapy  PT      Precautions   Precautions  Fall    Precaution Comments  per MD note, cleared for ROM strengthening, however will progress to PR/OM prior to strengthening      Balance Screen   Has the patient fallen in the past 6 months  Yes    How many times?  1    Has the patient had a decrease in activity level because of a fear of falling?   No    Is the patient reluctant to leave their home because of a fear of falling?   No      Home  Environment   Family/patient expects to be discharged to:  Private residence    Living Arrangements  Spouse/significant  other    Home Access  Stairs    Home Layout  One level    Lives With  Significant other      Prior Function   Level of Independence  Independent    Vocation  Retired    Leisure  plays golf once a week      ADL   Eating/Feeding  Independent    Grooming  Modified independent    ADL comments  Independent with all basic ADLS, currently using RUE more than LUE due to injury      IADL   Shopping  Takes care of all shopping needs independently    Light Housekeeping  Performs light daily tasks such as dishwashing, bed making    Meal Prep  Able to complete simple warm meal prep    Financial Management  Manages financial matters independently (budgets, writes checks, pays rent, bills goes to bank), collects and keeps track of income      Mobility   Mobility Status  Independent      Written Expression   Dominant Hand  Left      Cognition   Overall Cognitive Status  Within Functional Limits for tasks assessed      Coordination   Fine Motor Movements are Fluid and Coordinated  No      Edema   Edema  moderate in LUE, multiple scratches on forearm, pt attributes to puppy, one open wound,  bandaide applied      ROM / Strength   AROM / PROM / Strength  AROM      AROM   Overall AROM   Deficits    Right/Left Forearm  Left    Left Forearm Pronation  60 Degrees    Left Forearm Supination  70 Degrees    Right/Left Wrist  Left    Left Wrist Extension  45 Degrees    Left Wrist Flexion  40 Degrees    Left Wrist Radial Deviation  10 Degrees    Left Wrist Ulnar Deviation  25 Degrees      Left Hand AROM   L Thumb MCP 0-60  50 Degrees    L Thumb IP 0-80  45 Degrees      Hand Function   Right Hand Grip (lbs)  60 lbs    Left Hand Grip (lbs)  20 lbs                      OT Education - 08/26/17 0928    Education provided  Yes    Education Details  A/ROM HEP- see pt instructions, wearing brace when not exercising, no heavy lifting    Person(s) Educated  Patient    Methods  Explanation;Demonstration;Verbal cues;Handout    Comprehension  Verbalized understanding;Returned demonstration       OT Short Term Goals - 08/26/17 0939      OT SHORT TERM GOAL #1   Title  I with inital HEP- due 09/25/17    Time  4    Status  New    Target Date  09/25/17      OT SHORT TERM GOAL #2   Title  Pt will increase wrist flexion/ extension by 15* for increased functional use during ADLS.    Baseline  wrist flexion/ extension 40/45    Time  4    Period  Weeks    Status  New      OT SHORT TERM GOAL #3   Title  Pt will increase  forearm supination/ pronation by 10 for increased functional use during ADLS.    Baseline  supination/ pronation : 70/60    Time  4    Period  Weeks    Status  New        OT Long Term Goals - 08/27/2017 0942      OT LONG TERM GOAL #1   Title  I with updated HEP.- due 10/25/17    Time  8    Period  Weeks    Status  New    Target Date  10/25/17      OT LONG TERM GOAL #2   Title  Pt will resume use of LUE as dominant hand 90% of the time for ADLS/ IADLS with pain no greater than 3/10.    Time  8    Period  Weeks    Status  New      OT LONG  TERM GOAL #3   Title  Pt will increase LUE grip strength to 30 lbs for increased functional use during ADLs.    Baseline  LUE grip 20 lbs    Time  8    Period  Weeks    Status  New      OT LONG TERM GOAL #4   Title  Pt will demonstrate wrist flexion/ extension WFLS for ADLS/IADLS.    Time  8    Period  Weeks    Status  New            Plan - 08-27-17 0930    Clinical Impression Statement  Pt is a 66 y.o male who sustained a left extra articular distal radius fx on 07/14/17 s/p fall. Pt presents with the following deficits:decreased A/ROM, decreased strength, pain, decreased LUE functional use which impede performance of ADLS/IADLS. Pt can benefit from skilled occupational therapy to maximize pt's safety and independence with daily activities.    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, recent neutopenia, UTI, currently undergoing chemotherapy    Occupational performance deficits (Please refer to evaluation for details):  ADL's;IADL's;Leisure;Rest and Sleep;Social Participation    Rehab Potential  Good    Current Impairments/barriers affecting progress:  undergoing Chemo fro active CA    OT Frequency  2x / week plus eval    OT Duration  8 weeks    OT Treatment/Interventions  Self-care/ADL training;Moist Heat;Fluidtherapy;DME and/or AE instruction;Splinting;Therapeutic activities;Scar mobilization;Therapeutic exercise;Neuromuscular education;Passive range of motion;Patient/family education;Manual Therapy;Paraffin    Plan  progress HEP prn    Clinical Decision Making  Limited treatment options, no task modification necessary    OT Home Exercise Plan  issued A/ROM HEP    Consulted and Agree with Plan of Care  Patient       Patient will benefit from skilled therapeutic  intervention in order to improve the following deficits and impairments:  Impaired flexibility, Pain, Impaired UE functional use, Impaired perceived functional ability, Decreased knowledge of precautions, Decreased strength, Decreased range of motion, Decreased endurance, Decreased activity tolerance, Decreased coordination, Decreased skin integrity  Visit Diagnosis: Muscle weakness (generalized)  Stiffness of left wrist, not elsewhere classified  Pain in left wrist  Other lack of coordination  G-Codes - 08/27/17 0950    Functional Assessment Tool Used (Outpatient only)  clincal impressions: Grip strength LUE 20 lbs, RUE 60 lbs  Functional Limitation  Carrying, moving and handling objects    Carrying, Moving and Handling Objects Current Status [id number])  At least 40 percent but less than 60 percent impaired, limited or restricted    Carrying, Moving and Handling Objects Goal Status ([id number])  At least 1 percent but less than 20 percent impaired, limited or restricted       Problem List Patient Active Problem List   Diagnosis Date Noted  . Fever 07/02/2017  . Neutropenia ([place]) 07/02/2017  . Febrile neutropenia ([place]) 07/02/2017  . 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 Goals of care, counseling/discussion 02/12/2017  . Multiple lung nodules on CT 10/10/2016  . Alcoholism ([place]) 10/10/2016  . 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 Chronic atrial fibrillation ([place]) 02/14/2015  . Chronic diastolic CHF (congestive heart failure) ([place]) 02/14/2015  . Chronic diastolic heart failure ([place]) 02/14/2015  . Prediabetes 01/23/2015  . Tobacco abuse 01/23/2015  . Acute on chronic congestive heart failure ([place]) 01/15/2015  . Acute congestive heart failure with left ventricular diastolic dysfunction ([place]) 01/14/2015  . Alcohol abuse, daily use 01/14/2015  . Acute respiratory failure ([place]) 01/14/2015  . Hyponatremia 01/14/2015  . Acute right heart failure ([place]) 01/14/2015  . Congestive heart  disease ([place])   . Right heart failure ([place]) 01/03/2015  . Cardiomegaly 01/03/2015  . Atrial fibrillation, controlled ([place]) 01/03/2015  . Moderate aortic stenosis 01/03/2015  . Chronic anticoagulation 01/03/2015  . Hypertension 01/03/2015  . Dyspnea 01/03/2015  . A-fib ([place]) 02/17/2014  . Long term current use of anticoagulant 02/17/2014  . Essential (primary) hypertension 11/01/2013  . Adult BMI 30+ 10/21/2013  . Adiposity 04/22/2013  . H/O cardiovascular disorder 04/22/2013  . Headache, tension-type 02/05/2013    [facility] 08/26/2017, 9:51 AM Theone Murdoch, OTR/L [phone] Phone: [phone] 9:55 AM 08/26/17 [facility] [address] [place], [place], [address] Phone: [phone]   Fax:  [phone]  Name: Kevin Martin MRN: [id number] Date of Birth: [birth year]

## 2017-09-08 ENCOUNTER — Ambulatory Visit: Payer: Medicare Other | Attending: Radiation Oncology | Admitting: Occupational Therapy

## 2017-09-08 DIAGNOSIS — M6281 Muscle weakness (generalized): Secondary | ICD-10-CM | POA: Diagnosis present

## 2017-09-08 DIAGNOSIS — M25532 Pain in left wrist: Secondary | ICD-10-CM | POA: Diagnosis present

## 2017-09-08 DIAGNOSIS — R278 Other lack of coordination: Secondary | ICD-10-CM | POA: Diagnosis present

## 2017-09-08 DIAGNOSIS — M25632 Stiffness of left wrist, not elsewhere classified: Secondary | ICD-10-CM | POA: Diagnosis present

## 2017-09-08 NOTE — Therapy (Signed)
[facility] [address] [place], [place], [address] Phone: [phone]   Fax:  [phone]  Occupational Therapy Treatment  Patient Details  Name: Kevin Martin MRN: [id number] Date of Birth: 1951/07/24 No Data Recorded  Encounter Date: 09/08/2017  OT End of Session - 09/08/17 0900    Visit Number  2    Number of Visits  17    Date for OT Re-Evaluation  10/25/17    Authorization Type  Medicare    Authorization - Visit Number  2    Authorization - Number of Visits  10    OT Start Time  0850    OT Stop Time  0925    OT Time Calculation (min)  35 min    Behavior During Therapy  [facility] for tasks assessed/performed       Past Medical History:  Diagnosis Date  . Aortic stenosis    moderate by 12/16/15 echo  . Atrial fibrillation ([place])   . Cardiomegaly   . CHF (congestive heart failure) ([place])   . History of radiation therapy 10/23/16- 12/11/16   Laryngopharynx and bilateral neck, 70 Gy in 35 fractions.   . Hypertension   . Shortness of breath dyspnea     Past Surgical History:  Procedure Laterality Date  . BRONCHOSCOPY  03/16/2017   performed at [facility]   . COLONOSCOPY    . DIRECT LARYNGOSCOPY N/A 09/19/2016   Procedure: DIRECT LARYNGOSCOPY with BIOPSY;  Surgeon: Izora Gala, MD;  Location: [facility];  Service: ENT;  Laterality: N/A;  . ESOPHAGOSCOPY N/A 09/19/2016   Procedure: ESOPHAGOSCOPY;  Surgeon: Izora Gala, MD;  Location: [facility];  Service: ENT;  Laterality: N/A;  . FRACTURE SURGERY Right    right arm  . NECK SURGERY      There were no vitals filed for this visit.  Subjective Assessment - 09/08/17 0850    Subjective   Pt reports he has been exercising    Patient Stated Goals  regain use of LUE    Currently in Pain?  Yes    Pain Score  2     Pain Location  Wrist    Pain Orientation  Left    Pain Descriptors / Indicators  Aching    Pain Type  Acute pain    Pain Onset  More than a month ago    Aggravating  Factors   movement    Pain Relieving Factors  hydrocodone at night                Treatment: Fluidotherapy x 10 mins to LUE for pain and stiffness not adverse reactions. Tendon gliding ex, thumb flexion and opposition 15 reps each.           OT Education - 09/08/17 6063    Education provided  Yes    Education Details  Reviewed A/ROM HEP, issued P/ROM HEP and ulnar/ radial deviation A/ROM    Person(s) Educated  Patient    Methods  Explanation;Demonstration;Verbal cues;Handout    Comprehension  Verbalized understanding;Returned demonstration       OT Short Term Goals - 08/26/17 0939      OT SHORT TERM GOAL #1   Title  I with inital HEP- due 09/25/17    Time  4    Status  New    Target Date  09/25/17      OT SHORT TERM GOAL #2   Title  Pt will increase wrist flexion/ extension by 15* for increased  functional use during ADLS.    Baseline  wrist flexion/ extension 40/45    Time  4    Period  Weeks    Status  New      OT SHORT TERM GOAL #3   Title  Pt will increase forearm supination/ pronation by 10 for increased functional use during ADLS.    Baseline  supination/ pronation : 70/60    Time  4    Period  Weeks    Status  New        OT Long Term Goals - 08/26/17 0942      OT LONG TERM GOAL #1   Title  I with updated HEP.- due 10/25/17    Time  8    Period  Weeks    Status  New    Target Date  10/25/17      OT LONG TERM GOAL #2   Title  Pt will resume use of LUE as dominant hand 90% of the time for ADLS/ IADLS with pain no greater than 3/10.    Time  8    Period  Weeks    Status  New      OT LONG TERM GOAL #3   Title  Pt will increase LUE grip strength to 30 lbs for increased functional use during ADLs.    Baseline  LUE grip 20 lbs    Time  8    Period  Weeks    Status  New      OT LONG TERM GOAL #4   Title  Pt will demonstrate wrist flexion/ extension WFLS for ADLS/IADLS.    Time  8    Period  Weeks    Status  New        Long Term  Clinic Goals - 08/21/17 1021      CC Long Term Goal  #1   Title  Pt. will be knowledgeable about performing manual lymph drainage on himself.    Status  Achieved      CC Long Term Goal  #2   Title  Neck circumference measurement at 4 cm. superior to sternal notch will be reduced by at least 1.5 cm. to 49 cm. or less.    Status  Achieved      CC Long Term Goal  #3   Title  Neck circumference measurement at 8 cm. superior to sternal notch will reduce by at least 1.5 cm. to 51 cm. or less.    Status  Achieved      CC Long Term Goal  #4   Title  Neck circumference at 4 cm superior to sternal notch reduced to 48.5 or less    Status  Not Met      CC Long Term Goal  #5   Title  Neck circumference at 8 cm. superior to sternal notch reduced to 50.5 cm. or less    Status  Not Met      Head and Neck Clinic Goals - 10/14/16 1049      Patient will be able to verbalize understanding of a home exercise program for cervical range of motion, posture, and walking.    Status  Achieved      Patient will be able to verbalize understanding of proper sitting and standing posture.    Status  Achieved      Patient will be able to verbalize understanding of lymphedema risk and availability of treatment for this condition.    Status  Achieved  Plan - 09/08/17 0900    Clinical Impression Statement  Pt is progressing towards goals with improving A/ROM.    Rehab Potential  Good    Current Impairments/barriers affecting progress:  undergoing Chemo for active CA    OT Frequency  2x / week    OT Duration  8 weeks    OT Treatment/Interventions  Self-care/ADL training;Moist Heat;Fluidtherapy;DME and/or AE instruction;Splinting;Therapeutic activities;Scar mobilization;Therapeutic exercise;Neuromuscular education;Passive range of motion;Patient/family education;Manual Therapy;Paraffin    Plan  A/ROM, P/ROM fluidotherapy    OT Home Exercise Plan  issued A/ROM HEP, issued P/ROM HEP     Consulted and Agree with Plan of Care  Patient       Patient will benefit from skilled therapeutic intervention in order to improve the following deficits and impairments:  Impaired flexibility, Pain, Impaired UE functional use, Impaired perceived functional ability, Decreased knowledge of precautions, Decreased strength, Decreased range of motion, Decreased endurance, Decreased activity tolerance, Decreased coordination, Decreased skin integrity  Visit Diagnosis: Muscle weakness (generalized)  Stiffness of left wrist, not elsewhere classified  Pain in left wrist  Other lack of coordination    Problem List Patient Active Problem List   Diagnosis Date Noted  . Fever 07/02/2017  . Neutropenia ([place]) 07/02/2017  . Febrile neutropenia ([place]) 07/02/2017  . Metastasis to lung ([place]) 02/12/2017  . Goals of care, counseling/discussion 02/12/2017  . Multiple lung nodules on CT 10/10/2016  . Alcoholism ([place]) 10/10/2016  . 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 Chronic atrial fibrillation ([place]) 02/14/2015  . Chronic diastolic CHF (congestive heart failure) ([place]) 02/14/2015  . Chronic diastolic heart failure ([place]) 02/14/2015  . Prediabetes 01/23/2015  . Tobacco abuse 01/23/2015  . Acute on chronic congestive heart failure ([place]) 01/15/2015  . Acute congestive heart failure with left ventricular diastolic dysfunction ([place]) 01/14/2015  . Alcohol abuse, daily use 01/14/2015  . Acute respiratory failure ([place]) 01/14/2015  . Hyponatremia 01/14/2015  . Acute right heart failure ([place]) 01/14/2015  . Congestive heart disease ([place])   . Right heart failure ([place]) 01/03/2015  . Cardiomegaly 01/03/2015  . Atrial fibrillation, controlled ([place]) 01/03/2015  . Moderate aortic stenosis 01/03/2015  . Chronic anticoagulation 01/03/2015  . Hypertension 01/03/2015  . Dyspnea 01/03/2015  . A-fib ([place]) 02/17/2014  . Long term current use of anticoagulant 02/17/2014  . Essential (primary) hypertension  11/01/2013  . Adult BMI 30+ 10/21/2013  . Adiposity 04/22/2013  . H/O cardiovascular disorder 04/22/2013  . Headache, tension-type 02/05/2013    RINE,KATHRYN 09/08/2017, 12:32 PM  [facility] [address] [place], [place], [address] Phone: [phone]   Fax:  [phone]  Name: Kevin Martin MRN: [id number] Date of Birth: 02-Apr-1951

## 2017-09-08 NOTE — Patient Instructions (Signed)
Lay your hand on the tabletop, move hand/ wrist side to side like you are waving, 10 reps 3x day.    PROM: Wrist Flexion / Extension   Grasp  hand and slowly bend wrist until stretch is felt. Relax. Then stretch as far as possible in opposite direction. Be sure to keep elbow bent.  Hold __10__ sec. each way Repeat _10___ times per set.    Do _3___ sessions per day.  Pronation (Passive)   Keep elbow bent at right angle and held firmly to side. Use other hand to turn forearm until palm faces downward. Hold _10___ seconds. Repeat __10__ times. Do _3__ sessions per day.  Supination (Passive)   Keep elbow bent at right angle and held firmly at side. Use other hand to turn forearm until palm faces upward. Hold __10__ seconds. Repeat __10__ times. Do _3__ sessions per day.  Copyright  VHI. All rights reserved.

## 2017-09-11 ENCOUNTER — Ambulatory Visit: Payer: Medicare Other | Admitting: Occupational Therapy

## 2017-09-11 DIAGNOSIS — M25632 Stiffness of left wrist, not elsewhere classified: Secondary | ICD-10-CM

## 2017-09-11 DIAGNOSIS — M6281 Muscle weakness (generalized): Secondary | ICD-10-CM | POA: Diagnosis not present

## 2017-09-11 DIAGNOSIS — M25532 Pain in left wrist: Secondary | ICD-10-CM

## 2017-09-11 DIAGNOSIS — R278 Other lack of coordination: Secondary | ICD-10-CM

## 2017-09-11 NOTE — Patient Instructions (Signed)
1. Grip Strengthening (Resistive Putty)   Squeeze putty using thumb and all fingers. Repeat _20___ times. Do __2__ sessions per day.   2. Roll putty into tube on table and pinch between each finger and thumb x 10 reps each. (can do ring and small finger together)     Copyright  VHI. All rights reserved.   

## 2017-09-11 NOTE — Therapy (Signed)
[facility] [address] [place], [place], [address] Phone: [phone]   Fax:  [phone]  Occupational Therapy Treatment  Patient Details  Name: Kevin Martin MRN: [id number] Date of Birth: 01/30/1951 Referring Provider: Dr. Erlinda Hong   Encounter Date: 09/11/2017  OT End of Session - 09/11/17 0906    Visit Number  3    Number of Visits  17    Date for OT Re-Evaluation  10/25/17    Authorization Type  Medicare    Authorization - Visit Number  0    Authorization - Number of Visits  0    OT Start Time  0848    OT Stop Time  0925    OT Time Calculation (min)  37 min    Behavior During Therapy  [facility] for tasks assessed/performed       Past Medical History:  Diagnosis Date  . Aortic stenosis    moderate by 12/16/15 echo  . Atrial fibrillation ([place])   . Cardiomegaly   . CHF (congestive heart failure) ([place])   . History of radiation therapy 10/23/16- 12/11/16   Laryngopharynx and bilateral neck, 70 Gy in 35 fractions.   . Hypertension   . Shortness of breath dyspnea     Past Surgical History:  Procedure Laterality Date  . BRONCHOSCOPY  03/16/2017   performed at [facility]   . COLONOSCOPY    . DIRECT LARYNGOSCOPY N/A 09/19/2016   Procedure: DIRECT LARYNGOSCOPY with BIOPSY;  Surgeon: Izora Gala, MD;  Location: [facility];  Service: ENT;  Laterality: N/A;  . ESOPHAGOSCOPY N/A 09/19/2016   Procedure: ESOPHAGOSCOPY;  Surgeon: Izora Gala, MD;  Location: [place];  Service: ENT;  Laterality: N/A;  . FRACTURE SURGERY Right    right arm  . NECK SURGERY      There were no vitals filed for this visit.  Subjective Assessment - 09/11/17 0904    Subjective   Pt report his pain is about the same    Patient Stated Goals  regain use of LUE    Currently in Pain?  Yes    Pain Score  2     Pain Location  Wrist    Pain Orientation  Left    Pain Descriptors / Indicators  Aching    Pain Onset  More than a month ago    Aggravating Factors    movement    Pain Relieving Factors  meds    Multiple Pain Sites  No                  Treatment: Fluidotherapy x 12 mins, to LUE for pain and stiffness, no adverse reactions,  Forearm gym for increased A/ROM x 6 reps Issued yellow putty as pt is approx 8 weeks s/p injury.         OT Education - 09/11/17 [phone]    Education provided  Yes    Education Details  reviewed A/ROM wrist flexion/ extension, ulnar radial deviation, supination/ pronation, P/ROM wrist flexion/ extension, yellow putty HEP 10-20 reps each    Person(s) Educated  Patient    Methods  Explanation;Demonstration;Verbal cues;Handout    Comprehension  Verbalized understanding;Returned demonstration       OT Short Term Goals - 09/11/17 0919      OT SHORT TERM GOAL #1   Title  I with inital HEP- due 09/25/17    Time  4    Status  Achieved      OT SHORT TERM GOAL #2  Title  Pt will increase wrist flexion/ extension by 15* for increased functional use during ADLS.    Baseline  wrist flexion/ extension 40/45    Time  4    Period  Weeks    Status  On-going      OT SHORT TERM GOAL #3   Title  Pt will increase forearm supination/ pronation by 10 for increased functional use during ADLS.    Baseline  supination/ pronation : 70/60    Time  4    Period  Weeks    Status  On-going        OT Long Term Goals - 08/26/17 0942      OT LONG TERM GOAL #1   Title  I with updated HEP.- due 10/25/17    Time  8    Period  Weeks    Status  New    Target Date  10/25/17      OT LONG TERM GOAL #2   Title  Pt will resume use of LUE as dominant hand 90% of the time for ADLS/ IADLS with pain no greater than 3/10.    Time  8    Period  Weeks    Status  New      OT LONG TERM GOAL #3   Title  Pt will increase LUE grip strength to 30 lbs for increased functional use during ADLs.    Baseline  LUE grip 20 lbs    Time  8    Period  Weeks    Status  New      OT LONG TERM GOAL #4   Title  Pt will demonstrate wrist  flexion/ extension WFLS for ADLS/IADLS.    Time  8    Period  Weeks    Status  New        Long Term Clinic Goals - 08/21/17 1021      CC Long Term Goal  #1   Title  Pt. will be knowledgeable about performing manual lymph drainage on himself.    Status  Achieved      CC Long Term Goal  #2   Title  Neck circumference measurement at 4 cm. superior to sternal notch will be reduced by at least 1.5 cm. to 49 cm. or less.    Status  Achieved      CC Long Term Goal  #3   Title  Neck circumference measurement at 8 cm. superior to sternal notch will reduce by at least 1.5 cm. to 51 cm. or less.    Status  Achieved      CC Long Term Goal  #4   Title  Neck circumference at 4 cm superior to sternal notch reduced to 48.5 or less    Status  Not Met      CC Long Term Goal  #5   Title  Neck circumference at 8 cm. superior to sternal notch reduced to 50.5 cm. or less    Status  Not Met         Plan - 09/11/17 0919    Clinical Impression Statement  Pt is progressing towards goals with improving A/ROM.    Rehab Potential  Good    Current Impairments/barriers affecting progress:  undergoing Chemo for active CA    OT Frequency  2x / week    OT Duration  8 weeks    OT Treatment/Interventions  Self-care/ADL training;Moist Heat;Fluidtherapy;DME and/or AE instruction;Splinting;Therapeutic activities;Scar mobilization;Therapeutic exercise;Neuromuscular education;Passive range of motion;Patient/family education;Manual Therapy;Paraffin  Plan  A/ROM, P/ROM fluidotherapy, yellow putty,     OT Home Exercise Plan  issued A/ROM HEP, issued P/ROM HEP, yellow putty    Consulted and Agree with Plan of Care  Patient       Patient will benefit from skilled therapeutic intervention in order to improve the following deficits and impairments:  Impaired flexibility, Pain, Impaired UE functional use, Impaired perceived functional ability, Decreased knowledge of precautions, Decreased strength, Decreased range  of motion, Decreased endurance, Decreased activity tolerance, Decreased coordination, Decreased skin integrity  Visit Diagnosis: Muscle weakness (generalized)  Stiffness of left wrist, not elsewhere classified  Pain in left wrist  Other lack of coordination    Problem List Patient Active Problem List   Diagnosis Date Noted  . Fever 07/02/2017  . Neutropenia ([place]) 07/02/2017  . Febrile neutropenia ([place]) 07/02/2017  . Metastasis to lung ([place]) 02/12/2017  . Goals of care, counseling/discussion 02/12/2017  . Multiple lung nodules on CT 10/10/2016  . Alcoholism ([place]) 10/10/2016  . 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 Chronic atrial fibrillation ([place]) 02/14/2015  . Chronic diastolic CHF (congestive heart failure) ([place]) 02/14/2015  . Chronic diastolic heart failure ([place]) 02/14/2015  . Prediabetes 01/23/2015  . Tobacco abuse 01/23/2015  . Acute on chronic congestive heart failure ([place]) 01/15/2015  . Acute congestive heart failure with left ventricular diastolic dysfunction ([place]) 01/14/2015  . Alcohol abuse, daily use 01/14/2015  . Acute respiratory failure ([place]) 01/14/2015  . Hyponatremia 01/14/2015  . Acute right heart failure ([place]) 01/14/2015  . Congestive heart disease ([place])   . Right heart failure ([place]) 01/03/2015  . Cardiomegaly 01/03/2015  . Atrial fibrillation, controlled ([place]) 01/03/2015  . Moderate aortic stenosis 01/03/2015  . Chronic anticoagulation 01/03/2015  . Hypertension 01/03/2015  . Dyspnea 01/03/2015  . A-fib ([place]) 02/17/2014  . Long term current use of anticoagulant 02/17/2014  . Essential (primary) hypertension 11/01/2013  . Adult BMI 30+ 10/21/2013  . Adiposity 04/22/2013  . H/O cardiovascular disorder 04/22/2013  . Headache, tension-type 02/05/2013    Kevin Martin 09/11/2017, 9:26 AM Theone Murdoch, OTR/L [phone] Phone: [phone] 9:28 AM 09/11/17 [facility] [address] [place], [place], [address] Phone: [phone]   Fax:  [phone]  Name: Kevin Martin MRN: [id number] Date of Birth: Aug 26, 1951

## 2017-09-15 ENCOUNTER — Ambulatory Visit: Payer: Medicare Other | Admitting: Occupational Therapy

## 2017-09-15 ENCOUNTER — Ambulatory Visit (INDEPENDENT_AMBULATORY_CARE_PROVIDER_SITE_OTHER): Payer: Medicare Other | Admitting: Orthopaedic Surgery

## 2017-09-15 ENCOUNTER — Encounter (INDEPENDENT_AMBULATORY_CARE_PROVIDER_SITE_OTHER): Payer: Self-pay | Admitting: Orthopaedic Surgery

## 2017-09-15 ENCOUNTER — Ambulatory Visit (INDEPENDENT_AMBULATORY_CARE_PROVIDER_SITE_OTHER): Payer: Medicare Other

## 2017-09-15 DIAGNOSIS — M25532 Pain in left wrist: Secondary | ICD-10-CM

## 2017-09-15 DIAGNOSIS — S52532D Colles' fracture of left radius, subsequent encounter for closed fracture with routine healing: Secondary | ICD-10-CM

## 2017-09-15 NOTE — Progress Notes (Signed)
Office Visit Note   Patient: Kevin Martin           Date of Birth: [birth year]           MRN: [id number] Visit Date: 09/15/2017              Requested by: Berkley Harvey, NP [place], [place] [address] PCP: Berkley Harvey, NP   Assessment & Plan: Visit Diagnoses:  1. Closed Colles' fracture of left radius with routine healing   2. Pain in left wrist     Plan: At this point we would like for Kevin Martin to continue working in hand therapy as well as his home exercise program to regain as much motion as possible.  He will continue to wear his wrist splint when out in public for the next few weeks.  He will increase activity as tolerated.  He will follow-up with [place] on an as-needed basis.  Follow-Up Instructions: Return if symptoms worsen or fail to improve.   Orders:  Orders Placed This Encounter  Procedures  . XR Wrist 2 Views Left   No orders of the defined types were placed in this encounter.     Procedures: No procedures performed   Clinical Data: No additional findings.   Subjective: Chief Complaint  Patient presents with  . Left Wrist - Pain     HPI Kevin Martin comes in for follow-up.  8 weeks out Colles' fracture on the left.  Doing well.  He has been in a removable wrist splint when out in public over the past 4 weeks.  He has been coming out of this at home and to do his exercises.  He is also dependent hand therapy making great progress.  Minimal pain.  Review of Systems is detailed in HPI.  All others reviewed and are negative.   Objective: Vital Signs: There were no vitals taken for this visit.  Physical Exam well-developed well-nourished gentleman in no acute distress.  Alert and oriented x3.  Ortho Exam examination of his left wrist reveals no tenderness over the distal radius.  He has near full supination and pronation.  Little stiff with ulnar and radial deviation.  He is neurovascular intact distally.  Specialty Comments:  No  specialty comments available.  Imaging: Xr Wrist 2 Views Left  Result Date: 09/15/2017 2 views of the left wrist showed moderate callus formation.  No further collapse.  There is still a fair amount of dorsal angulation but this is acceptable given his age and correlation with clinical exam.    PMFS History: Patient Active Problem List   Diagnosis Date Noted  . Pain in left wrist 09/15/2017  . Closed Colles' fracture of left radius with routine healing 09/15/2017  . Fever 07/02/2017  . Neutropenia ([place]) 07/02/2017  . Febrile neutropenia ([place]) 07/02/2017  . Metastasis to lung ([place]) 02/12/2017  . Goals of care, counseling/discussion 02/12/2017  . Multiple lung nodules on CT 10/10/2016  . Alcoholism ([place]) 10/10/2016  . 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 Chronic atrial fibrillation ([place]) 02/14/2015  . Chronic diastolic CHF (congestive heart failure) ([place]) 02/14/2015  . Chronic diastolic heart failure ([place]) 02/14/2015  . Prediabetes 01/23/2015  . Tobacco abuse 01/23/2015  . Acute on chronic congestive heart failure ([place]) 01/15/2015  . Acute congestive heart failure with left ventricular diastolic dysfunction ([place]) 01/14/2015  . Alcohol abuse, daily use 01/14/2015  . Acute respiratory failure ([place]) 01/14/2015  . Hyponatremia 01/14/2015  .  Acute right heart failure ([place]) 01/14/2015  . Congestive heart disease ([place])   . Right heart failure ([place]) 01/03/2015  . Cardiomegaly 01/03/2015  . Atrial fibrillation, controlled ([place]) 01/03/2015  . Moderate aortic stenosis 01/03/2015  . Chronic anticoagulation 01/03/2015  . Hypertension 01/03/2015  . Dyspnea 01/03/2015  . A-fib ([place]) 02/17/2014  . Long term current use of anticoagulant 02/17/2014  . Essential (primary) hypertension 11/01/2013  . Adult BMI 30+ 10/21/2013  . Adiposity 04/22/2013  . H/O cardiovascular disorder 04/22/2013  . Headache, tension-type 02/05/2013   Past Medical History:  Diagnosis Date  . Aortic  stenosis    moderate by 12/16/15 echo  . Atrial fibrillation ([place])   . Cardiomegaly   . CHF (congestive heart failure) ([place])   . History of radiation therapy 10/23/16- 12/11/16   Laryngopharynx and bilateral neck, 70 Gy in 35 fractions.   . Hypertension   . Shortness of breath dyspnea     Family History  Problem Relation Age of Onset  . Cancer Mother   . Cancer Father   . Stroke Maternal Grandfather   . Heart attack Neg Hx     Past Surgical History:  Procedure Laterality Date  . BRONCHOSCOPY  03/16/2017   performed at [facility]   . COLONOSCOPY    . DIRECT LARYNGOSCOPY N/A 09/19/2016   Procedure: DIRECT LARYNGOSCOPY with BIOPSY;  Surgeon: Izora Gala, MD;  Location: [facility];  Service: ENT;  Laterality: N/A;  . ESOPHAGOSCOPY N/A 09/19/2016   Procedure: ESOPHAGOSCOPY;  Surgeon: Izora Gala, MD;  Location: [facility];  Service: ENT;  Laterality: N/A;  . FRACTURE SURGERY Right    right arm  . NECK SURGERY     Social History   Occupational History  . Occupation: retired Nurse, mental health  . Smoking status: Former Smoker    Years: 10.00    Types: Cigars, Cigarettes    Last attempt to quit: 09/01/2016    Years since quitting: 1.0  . Smokeless tobacco: Never Used  . Tobacco comment: quit cigarette smoking 10 years ago, smokes 5 cigars a day ([employer])  Substance and Sexual Activity  . Alcohol use: Yes    Alcohol/week: 0.0 oz    Comment: every day about 2 mixed drinks.   . Drug use: No  . Sexual activity: Not on file

## 2017-09-17 ENCOUNTER — Ambulatory Visit: Payer: Medicare Other | Admitting: Occupational Therapy

## 2017-09-17 ENCOUNTER — Encounter: Payer: Self-pay | Admitting: Occupational Therapy

## 2017-09-17 DIAGNOSIS — M6281 Muscle weakness (generalized): Secondary | ICD-10-CM | POA: Diagnosis not present

## 2017-09-17 DIAGNOSIS — M25632 Stiffness of left wrist, not elsewhere classified: Secondary | ICD-10-CM

## 2017-09-17 DIAGNOSIS — R278 Other lack of coordination: Secondary | ICD-10-CM

## 2017-09-17 DIAGNOSIS — M25532 Pain in left wrist: Secondary | ICD-10-CM

## 2017-09-17 NOTE — Therapy (Signed)
[facility] [address] [place], [place], [address] Phone: [phone]   Fax:  [phone]  Occupational Therapy Treatment  Patient Details  Name: Kevin Martin MRN: [id number] Date of Birth: 13-Sep-1950 Referring Provider: Dr. Erlinda Hong   Encounter Date: 09/17/2017  OT End of Session - 09/17/17 1213    Visit Number  4    Number of Visits  17    Date for OT Re-Evaluation  10/25/17    Authorization Type  Medicare - will need PN every 10th visit    Authorization - Number of Visits  10    OT Start Time  0847    OT Stop Time  0927    OT Time Calculation (min)  40 min    Activity Tolerance  Patient tolerated treatment well       Past Medical History:  Diagnosis Date  . Aortic stenosis    moderate by 12/16/15 echo  . Atrial fibrillation ([place])   . Cardiomegaly   . CHF (congestive heart failure) ([place])   . History of radiation therapy 10/23/16- 12/11/16   Laryngopharynx and bilateral neck, 70 Gy in 35 fractions.   . Hypertension   . Shortness of breath dyspnea     Past Surgical History:  Procedure Laterality Date  . BRONCHOSCOPY  03/16/2017   performed at [facility]   . COLONOSCOPY    . DIRECT LARYNGOSCOPY N/A 09/19/2016   Procedure: DIRECT LARYNGOSCOPY with BIOPSY;  Surgeon: Izora Gala, MD;  Location: [facility];  Service: ENT;  Laterality: N/A;  . ESOPHAGOSCOPY N/A 09/19/2016   Procedure: ESOPHAGOSCOPY;  Surgeon: Izora Gala, MD;  Location: [facility];  Service: ENT;  Laterality: N/A;  . FRACTURE SURGERY Right    right arm  . NECK SURGERY      There were no vitals filed for this visit.  Subjective Assessment - 09/17/17 0847    Patient Stated Goals  regain use of LUE    Currently in Pain?  Yes    Pain Score  2     Pain Location  Wrist    Pain Orientation  Left    Pain Descriptors / Indicators  Sore    Pain Type  Acute pain    Pain Onset  More than a month ago    Pain Frequency  Intermittent    Aggravating Factors   certain  movements - mostly ulnar and radial deviation    Pain Relieving Factors  meds, heat                   OT Treatments/Exercises (OP) - 09/17/17 0001      Exercises   Exercises  Wrist      Wrist Exercises   Other wrist exercises  Therapeutic exercise using 1 pound weight for wrist flexion, extension, and radial deivation. Pt has no pain with ulnar deviation without weight after manual therapy however had pain with 1 pound weight. Also addressed weighted supination and pronation - all exercises 10 reps x2.  Pt tolerated all well .  Pt did need mld cues to keep wrist from deviating with exercises therefore will instruct on  more session and then try to upgrade HEP if possible.       Modalities   Modalities  Fluidotherapy L wrist, 12 min, to reduce pain/stiffness. Tolerate well.      Manual Therapy   Manual Therapy  Soft tissue mobilization;Joint mobilization;Passive ROM    Manual therapy comments  soft tissue, joint mob and  passive ROM to address stiffness, pain and improve range of motion.  Pt initially had pain with ulnar and radial deviation on ulnar side. Pt with no pain after  mobs with this movement.                OT Short Term Goals - 09/17/17 1212      OT SHORT TERM GOAL #1   Title  I with inital HEP- due 09/25/17    Time  4    Status  Achieved      OT SHORT TERM GOAL #2   Title  Pt will increase wrist flexion/ extension by 15* for increased functional use during ADLS.    Baseline  wrist flexion/ extension 40/45    Time  4    Period  Weeks    Status  On-going      OT SHORT TERM GOAL #3   Title  Pt will increase forearm supination/ pronation by 10 for increased functional use during ADLS.    Baseline  supination/ pronation : 70/60    Time  4    Period  Weeks    Status  On-going        OT Long Term Goals - 09/17/17 1212      OT LONG TERM GOAL #1   Title  I with updated HEP.- due 10/25/17    Time  8    Period  Weeks    Status  New      OT LONG  TERM GOAL #2   Title  Pt will resume use of LUE as dominant hand 90% of the time for ADLS/ IADLS with pain no greater than 3/10.    Time  8    Period  Weeks    Status  New      OT LONG TERM GOAL #3   Title  Pt will increase LUE grip strength to 30 lbs for increased functional use during ADLs.    Baseline  LUE grip 20 lbs    Time  8    Period  Weeks    Status  New      OT LONG TERM GOAL #4   Title  Pt will demonstrate wrist flexion/ extension WFLS for ADLS/IADLS.    Time  8    Period  Weeks    Status  New        Long Term Clinic Goals - 08/21/17 1021      CC Long Term Goal  #1   Title  Pt. will be knowledgeable about performing manual lymph drainage on himself.    Status  Achieved      CC Long Term Goal  #2   Title  Neck circumference measurement at 4 cm. superior to sternal notch will be reduced by at least 1.5 cm. to 49 cm. or less.    Status  Achieved      CC Long Term Goal  #3   Title  Neck circumference measurement at 8 cm. superior to sternal notch will reduce by at least 1.5 cm. to 51 cm. or less.    Status  Achieved      CC Long Term Goal  #4   Title  Neck circumference at 4 cm superior to sternal notch reduced to 48.5 or less    Status  Not Met      CC Long Term Goal  #5   Title  Neck circumference at 8 cm. superior to sternal notch reduced to 50.5 cm. or  less    Status  Not Met      Head and Neck Clinic Goals - 10/14/16 1049      Patient will be able to verbalize understanding of a home exercise program for cervical range of motion, posture, and walking.    Status  Achieved      Patient will be able to verbalize understanding of proper sitting and standing posture.    Status  Achieved      Patient will be able to verbalize understanding of lymphedema risk and availability of treatment for this condition.    Status  Achieved        Plan - 09/17/17 1212    Clinical Impression Statement  PT progressing toward goals. Pt reports he is  using his L hand more at home.     Rehab Potential  Good    Current Impairments/barriers affecting progress:  undergoing Chemo for active CA    OT Frequency  2x / week    OT Duration  8 weeks    OT Treatment/Interventions  Self-care/ADL training;Moist Heat;Fluidtherapy;DME and/or AE instruction;Splinting;Therapeutic activities;Scar mobilization;Therapeutic exercise;Neuromuscular education;Passive range of motion;Patient/family education;Manual Therapy;Paraffin    Plan  A/ROM, P/ROM fluidotherapy, yellow putty,     Consulted and Agree with Plan of Care  Patient       Patient will benefit from skilled therapeutic intervention in order to improve the following deficits and impairments:  Impaired flexibility, Pain, Impaired UE functional use, Impaired perceived functional ability, Decreased knowledge of precautions, Decreased strength, Decreased range of motion, Decreased endurance, Decreased activity tolerance, Decreased coordination, Decreased skin integrity  Visit Diagnosis: Muscle weakness (generalized)  Stiffness of left wrist, not elsewhere classified  Pain in left wrist  Other lack of coordination    Problem List Patient Active Problem List   Diagnosis Date Noted  . Pain in left wrist 09/15/2017  . Closed Colles' fracture of left radius with routine healing 09/15/2017  . Fever 07/02/2017  . Neutropenia ([place]) 07/02/2017  . Febrile neutropenia ([place]) 07/02/2017  . Metastasis to lung ([place]) 02/12/2017  . Goals of care, counseling/discussion 02/12/2017  . Multiple lung nodules on CT 10/10/2016  . Alcoholism ([place]) 10/10/2016  . 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 Chronic atrial fibrillation ([place]) 02/14/2015  . Chronic diastolic CHF (congestive heart failure) ([place]) 02/14/2015  . Chronic diastolic heart failure ([place]) 02/14/2015  . Prediabetes 01/23/2015  . Tobacco abuse 01/23/2015  . Acute on chronic congestive heart failure ([place]) 01/15/2015  . Acute congestive heart  failure with left ventricular diastolic dysfunction ([place]) 01/14/2015  . Alcohol abuse, daily use 01/14/2015  . Acute respiratory failure ([place]) 01/14/2015  . Hyponatremia 01/14/2015  . Acute right heart failure ([place]) 01/14/2015  . Congestive heart disease ([place])   . Right heart failure ([place]) 01/03/2015  . Cardiomegaly 01/03/2015  . Atrial fibrillation, controlled ([place]) 01/03/2015  . Moderate aortic stenosis 01/03/2015  . Chronic anticoagulation 01/03/2015  . Hypertension 01/03/2015  . Dyspnea 01/03/2015  . A-fib ([place]) 02/17/2014  . Long term current use of anticoagulant 02/17/2014  . Essential (primary) hypertension 11/01/2013  . Adult BMI 30+ 10/21/2013  . Adiposity 04/22/2013  . H/O cardiovascular disorder 04/22/2013  . Headache, tension-type 02/05/2013    Quay Burow, OTR/L 09/17/2017, 12:15 PM  [facility] [address] [place], [place], [address] Phone: [phone]   Fax:  [phone]  Name: JEROMEY KRUER MRN: [id number] Date of Birth: [birth year]

## 2017-09-21 ENCOUNTER — Encounter: Payer: Self-pay | Admitting: *Deleted

## 2017-09-22 ENCOUNTER — Ambulatory Visit: Payer: Medicare Other | Admitting: Occupational Therapy

## 2017-09-22 DIAGNOSIS — M25532 Pain in left wrist: Secondary | ICD-10-CM

## 2017-09-22 DIAGNOSIS — M25632 Stiffness of left wrist, not elsewhere classified: Secondary | ICD-10-CM

## 2017-09-22 DIAGNOSIS — M6281 Muscle weakness (generalized): Secondary | ICD-10-CM

## 2017-09-22 NOTE — Patient Instructions (Signed)
Extension (Resistive)    With wrist over edge of table, lift __1 lb., keeping arm on table surface. Hold __2__ seconds. Lower slowly. Repeat _10___ times. Do __2__ sessions per day.   Flexion (Resistive)    With hand palm-up and holding __1 lb.__ , bend hand toward you at wrist. Hold _2___ seconds. Relax slowly. Repeat _10___ times. Do _2___ sessions per day.   Wrist Radial Deviation: Resisted    With left thumb up and pinky finger side on table, with _1___ pound weight in hand, bend wrist up towards thumb side. Return slowly. Repeat __10__ times per set. Do __2__ sessions per day.

## 2017-09-22 NOTE — Therapy (Signed)
[facility] [address] [place], [place], [address] Phone: [phone]   Fax:  [phone]  Occupational Therapy Treatment  Patient Details  Name: Kevin Martin MRN: [id number] Date of Birth: April 07, 1951 Referring Provider: Dr. Erlinda Hong   Encounter Date: 09/22/2017  OT End of Session - 09/22/17 0927    Visit Number  5    Number of Visits  17    Date for OT Re-Evaluation  10/25/17    Authorization Type  Medicare - will need PN every 10th visit    OT Start Time  0845 10 min. heat unbillable    OT Stop Time  0930    OT Time Calculation (min)  45 min    Activity Tolerance  Patient tolerated treatment well    Behavior During Therapy  [facility] for tasks assessed/performed       Past Medical History:  Diagnosis Date  . Aortic stenosis    moderate by 12/16/15 echo  . Atrial fibrillation ([place])   . Cardiomegaly   . CHF (congestive heart failure) ([place])   . History of radiation therapy 10/23/16- 12/11/16   Laryngopharynx and bilateral neck, 70 Gy in 35 fractions.   . Hypertension   . Shortness of breath dyspnea     Past Surgical History:  Procedure Laterality Date  . BRONCHOSCOPY  03/16/2017   performed at [facility]   . COLONOSCOPY    . DIRECT LARYNGOSCOPY N/A 09/19/2016   Procedure: DIRECT LARYNGOSCOPY with BIOPSY;  Surgeon: Izora Gala, MD;  Location: [facility];  Service: ENT;  Laterality: N/A;  . ESOPHAGOSCOPY N/A 09/19/2016   Procedure: ESOPHAGOSCOPY;  Surgeon: Izora Gala, MD;  Location: [facility];  Service: ENT;  Laterality: N/A;  . FRACTURE SURGERY Right    right arm  . NECK SURGERY      There were no vitals filed for this visit.  Subjective Assessment - 09/22/17 0854    Subjective   Only hurts a little with certain movements    Patient Stated Goals  regain use of LUE    Currently in Pain?  Yes    Pain Score  2     Pain Location  Wrist    Pain Orientation  Left    Pain Descriptors / Indicators  Sore    Pain Type  Acute pain    Pain Onset  More than a month ago    Pain Frequency  Intermittent    Aggravating Factors   mostly RD and UD    Pain Relieving Factors  Meds, heat                   OT Treatments/Exercises (OP) - 09/22/17 0001      ADLs   ADL Comments  Noted RD and wrist flexion tighter than UD and wrist ext. However, pt has pain with UD      Exercises   Exercises  Hand      Wrist Exercises   Other wrist exercises  A/ROM in wrist flex/ext, and RD/UD x 15 reps each. Progressed to P/ROM in wrist flexion and extension. Pt shown prayer stretch for wrist ext.     Other wrist exercises  light wrist strengthening in wrist flex, ext, and RD x 10 reps each with 1 lb. weight. Pt issued strengthening HEP - see pt instructions      Hand Exercises   Other Hand Exercises  Ugraded putty HEP to red resistance and issued red putty. Pt demo grip and pinch x  10 reps each    Other Hand Exercises  Gripper set at level 1 resistance to pick up blocks for sustained grip strength - no difficulty     Modalities   Modalities  Moist Heat      Moist Heat Therapy   Number Minutes Moist Heat  10 Minutes    Moist Heat Location  Wrist Pt had small open wound therefore unable to do fluidotherapy             OT Education - 09/22/17 0922    Education provided  Yes    Education Details  wrist strengthening HEP (wrist flex, ext, RD)    Person(s) Educated  Patient    Methods  Explanation;Demonstration;Handout    Comprehension  Verbalized understanding;Returned demonstration       OT Short Term Goals - 09/17/17 1212      OT SHORT TERM GOAL #1   Title  I with inital HEP- due 09/25/17    Time  4    Status  Achieved      OT SHORT TERM GOAL #2   Title  Pt will increase wrist flexion/ extension by 15* for increased functional use during ADLS.    Baseline  wrist flexion/ extension 40/45    Time  4    Period  Weeks    Status  On-going      OT SHORT TERM GOAL #3   Title  Pt will increase forearm supination/  pronation by 10 for increased functional use during ADLS.    Baseline  supination/ pronation : 70/60    Time  4    Period  Weeks    Status  On-going        OT Long Term Goals - 09/17/17 1212      OT LONG TERM GOAL #1   Title  I with updated HEP.- due 10/25/17    Time  8    Period  Weeks    Status  New      OT LONG TERM GOAL #2   Title  Pt will resume use of LUE as dominant hand 90% of the time for ADLS/ IADLS with pain no greater than 3/10.    Time  8    Period  Weeks    Status  New      OT LONG TERM GOAL #3   Title  Pt will increase LUE grip strength to 30 lbs for increased functional use during ADLs.    Baseline  LUE grip 20 lbs    Time  8    Period  Weeks    Status  New      OT LONG TERM GOAL #4   Title  Pt will demonstrate wrist flexion/ extension WFLS for ADLS/IADLS.    Time  8    Period  Weeks    Status  New            Plan - 09/22/17 1443    Clinical Impression Statement  Pt progressing towards goals. 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, recent neutopenia, UTI, currently undergoing chemotherapy    Occupational performance deficits (Please refer to evaluation for details):  ADL's;IADL's;Leisure;Rest and Sleep;Social Participation    Rehab Potential  Good    Current Impairments/barriers affecting progress:  undergoing Chemo for active CA    OT Frequency  2x / week    OT Duration  8 weeks    OT Treatment/Interventions  Self-care/ADL training;Moist Heat;Fluidtherapy;DME and/or AE instruction;Splinting;Therapeutic activities;Scar mobilization;Therapeutic exercise;Neuromuscular education;Passive range of motion;Patient/family education;Manual Therapy;Paraffin    Plan  fluidotherapy if small wound closed,  continue ROM and light strengthening, try level 2 gripper, assess remaining STG's       Patient will benefit from skilled therapeutic intervention in order to improve the following deficits and impairments:  Impaired flexibility, Pain, Impaired UE functional use, Impaired perceived functional ability, Decreased knowledge of precautions, Decreased strength, Decreased range of motion, Decreased endurance, Decreased activity tolerance, Decreased coordination, Decreased skin integrity  Visit Diagnosis: Stiffness of left wrist, not elsewhere classified  Pain in left wrist  Muscle weakness (generalized)    Problem List Patient Active Problem List   Diagnosis Date Noted  . Pain in left wrist 09/15/2017  . Closed Colles' fracture of left radius with routine healing 09/15/2017  . Fever 07/02/2017  . Neutropenia ([place]) 07/02/2017  . Febrile neutropenia ([place]) 07/02/2017  . Metastasis to lung ([place]) 02/12/2017  . Goals of care, counseling/discussion 02/12/2017  . Multiple lung nodules on CT 10/10/2016  . Alcoholism ([place]) 10/10/2016  . 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 Chronic atrial fibrillation ([place]) 02/14/2015  . Chronic diastolic CHF (congestive heart failure) ([place]) 02/14/2015  . Chronic diastolic heart failure ([place]) 02/14/2015  . Prediabetes 01/23/2015  . Tobacco abuse 01/23/2015  . Acute on chronic congestive heart failure ([place]) 01/15/2015  . Acute congestive heart failure with left ventricular diastolic dysfunction ([place]) 01/14/2015  . Alcohol abuse, daily use 01/14/2015  . Acute respiratory failure ([place]) 01/14/2015  . Hyponatremia 01/14/2015  . Acute right heart failure ([place]) 01/14/2015  . Congestive heart disease ([place])   . Right heart failure ([place]) 01/03/2015  . Cardiomegaly 01/03/2015  . Atrial fibrillation, controlled ([place]) 01/03/2015  . Moderate aortic stenosis 01/03/2015  . Chronic anticoagulation 01/03/2015  . Hypertension 01/03/2015  . Dyspnea 01/03/2015  .  A-fib ([place]) 02/17/2014  . Long term current use of anticoagulant 02/17/2014  . Essential (primary) hypertension 11/01/2013  . Adult BMI 30+ 10/21/2013  . Adiposity 04/22/2013  . H/O cardiovascular disorder 04/22/2013  . Headache, tension-type 02/05/2013    Carey Bullocks, OTR/L 09/22/2017, 9:29 AM  [facility] [address] [place], [place], [address] Phone: [phone]   Fax:  [phone]  Name: Kevin Martin MRN: [id number] Date of Birth: August 05, 1951

## 2017-09-24 ENCOUNTER — Ambulatory Visit: Payer: Medicare Other | Admitting: Occupational Therapy

## 2017-09-24 DIAGNOSIS — M25532 Pain in left wrist: Secondary | ICD-10-CM

## 2017-09-24 DIAGNOSIS — M25632 Stiffness of left wrist, not elsewhere classified: Secondary | ICD-10-CM

## 2017-09-24 DIAGNOSIS — R278 Other lack of coordination: Secondary | ICD-10-CM

## 2017-09-24 DIAGNOSIS — M6281 Muscle weakness (generalized): Secondary | ICD-10-CM | POA: Diagnosis not present

## 2017-09-24 NOTE — Therapy (Signed)
[facility] [address] [place], [place], [address] Phone: [phone]   Fax:  [phone]  Occupational Therapy Treatment  Patient Details  Name: Kevin Martin MRN: [id number] Date of Birth: April 04, 1951 Referring Provider: Dr. Erlinda Hong   Encounter Date: 09/24/2017  OT End of Session - 09/24/17 0910    Visit Number  5    Number of Visits  17    Date for OT Re-Evaluation  10/25/17    Authorization Type  Medicare - will need PN every 10th visit    OT Start Time  0847    OT Stop Time  0930    OT Time Calculation (min)  43 min    Activity Tolerance  Patient tolerated treatment well    Behavior During Therapy  [facility] for tasks assessed/performed       Past Medical History:  Diagnosis Date  . Aortic stenosis    moderate by 12/16/15 echo  . Atrial fibrillation ([place])   . Cardiomegaly   . CHF (congestive heart failure) ([place])   . History of radiation therapy 10/23/16- 12/11/16   Laryngopharynx and bilateral neck, 70 Gy in 35 fractions.   . Hypertension   . Shortness of breath dyspnea     Past Surgical History:  Procedure Laterality Date  . BRONCHOSCOPY  03/16/2017   performed at [facility]   . COLONOSCOPY    . DIRECT LARYNGOSCOPY N/A 09/19/2016   Procedure: DIRECT LARYNGOSCOPY with BIOPSY;  Surgeon: Izora Gala, MD;  Location: [place];  Service: ENT;  Laterality: N/A;  . ESOPHAGOSCOPY N/A 09/19/2016   Procedure: ESOPHAGOSCOPY;  Surgeon: Izora Gala, MD;  Location: [facility];  Service: ENT;  Laterality: N/A;  . FRACTURE SURGERY Right    right arm  . NECK SURGERY      There were no vitals filed for this visit.  Subjective Assessment - 09/24/17 0908    Subjective   Only hurts a little with certain movements    Pertinent History   Pt is a 67 y.o male who sustained a left extra articular distal radius fx on 07/14/17 s/p fall    Patient Stated Goals  regain use of LUE    Currently in Pain?  Yes    Pain Score  4     Pain Location  Wrist     Pain Orientation  Left    Pain Descriptors / Indicators  Sore    Pain Type  Acute pain    Pain Onset  More than a month ago    Pain Frequency  Intermittent    Aggravating Factors   ROM, strengthening    Pain Relieving Factors  meds, heat    Multiple Pain Sites  No               Treatment:  Exercises   Exercises  Hand      Wrist Exercises   Other wrist exercises  A/ROM in wrist flex/ext, and RD/UD x 15 reps each. Progressed to P/ROM in wrist flexion and extension. Pt perfromed prayer stretch for wrist ext.     Other wrist exercises  light wrist strengthening in wrist flex, ext, and RD x 10 reps each with 1 lb. weight. Pt issued strengthening HEP - see pt instructions      Hand Exercises   Other Hand Exercises  Reviewed Ugraded putty HEP with red resistance Pt demo grip and pinch x 20 reps each    Other Hand Exercises  Gripper set at level 2  resistance to pick up blocks for sustained grip strength - min  difficulty     Modalities   Modalities  Moist Heat , no adverse reactions     Moist Heat Therapy   Number Minutes Moist Heat  10 Minutes    Moist Heat Location  Wrist Pt had small  wound will allow more time for healing prior to  fluidotherapy                         OT Short Term Goals - 09/17/17 1212      OT SHORT TERM GOAL #1   Title  I with inital HEP- due 09/25/17    Time  4    Status  Achieved      OT SHORT TERM GOAL #2   Title  Pt will increase wrist flexion/ extension by 15* for increased functional use during ADLS.    Baseline  wrist flexion/ extension 40/45    Time  4    Period  Weeks    Status  On-going      OT SHORT TERM GOAL #3   Title  Pt will increase forearm supination/ pronation by 10 for increased functional use during ADLS.    Baseline  supination/ pronation : 70/60    Time  4    Period  Weeks    Status  On-going        OT Long Term Goals - 09/17/17 1212      OT LONG TERM GOAL #1   Title  I with updated HEP.- due  10/25/17    Time  8    Period  Weeks    Status  New      OT LONG TERM GOAL #2   Title  Pt will resume use of LUE as dominant hand 90% of the time for ADLS/ IADLS with pain no greater than 3/10.    Time  8    Period  Weeks    Status  New      OT LONG TERM GOAL #3   Title  Pt will increase LUE grip strength to 30 lbs for increased functional use during ADLs.    Baseline  LUE grip 20 lbs    Time  8    Period  Weeks    Status  New      OT LONG TERM GOAL #4   Title  Pt will demonstrate wrist flexion/ extension WFLS for ADLS/IADLS.    Time  8    Period  Weeks    Status  New            Plan - 09/24/17 0920    Clinical Impression Statement  Pt progressing towards goals. Pt tolerating light strengthening to wrist yet he reports increased soreness today.    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, recent neutopenia, UTI, currently undergoing chemotherapy    Occupational performance deficits (Please refer to evaluation for details):  ADL's;IADL's;Leisure;Rest and Sleep;Social Participation    Plan  fluidotherapy if small wound closed adequately, continue ROM and light strengthening,        Patient will benefit from skilled therapeutic intervention in order to improve the following deficits and impairments:     Visit Diagnosis: Stiffness of left wrist, not elsewhere classified  Pain in  left wrist  Muscle weakness (generalized)  Other lack of coordination    Problem List Patient Active Problem List   Diagnosis Date Noted  . Pain in left wrist 09/15/2017  . Closed Colles' fracture of left radius with routine healing 09/15/2017  . Fever 07/02/2017  . Neutropenia ([place]) 07/02/2017  . Febrile neutropenia ([place]) 07/02/2017  . Metastasis to lung ([place]) 02/12/2017  . Goals  of care, counseling/discussion 02/12/2017  . Multiple lung nodules on CT 10/10/2016  . Alcoholism ([place]) 10/10/2016  . 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 Chronic atrial fibrillation ([place]) 02/14/2015  . Chronic diastolic CHF (congestive heart failure) ([place]) 02/14/2015  . Chronic diastolic heart failure ([place]) 02/14/2015  . Prediabetes 01/23/2015  . Tobacco abuse 01/23/2015  . Acute on chronic congestive heart failure ([place]) 01/15/2015  . Acute congestive heart failure with left ventricular diastolic dysfunction ([place]) 01/14/2015  . Alcohol abuse, daily use 01/14/2015  . Acute respiratory failure ([place]) 01/14/2015  . Hyponatremia 01/14/2015  . Acute right heart failure ([place]) 01/14/2015  . Congestive heart disease ([place])   . Right heart failure ([place]) 01/03/2015  . Cardiomegaly 01/03/2015  . Atrial fibrillation, controlled ([place]) 01/03/2015  . Moderate aortic stenosis 01/03/2015  . Chronic anticoagulation 01/03/2015  . Hypertension 01/03/2015  . Dyspnea 01/03/2015  . A-fib ([place]) 02/17/2014  . Long term current use of anticoagulant 02/17/2014  . Essential (primary) hypertension 11/01/2013  . Adult BMI 30+ 10/21/2013  . Adiposity 04/22/2013  . H/O cardiovascular disorder 04/22/2013  . Headache, tension-type 02/05/2013    RINE,KATHRYN 09/24/2017, 9:20 AM Theone Murdoch, OTR/L [phone] Phone: [phone] 9:26 AM 01/24/19[facility] [address] [place], [place], [address] Phone: [phone]   Fax:  [phone]  Name: Kevin Martin MRN: [id number] Date of Birth: [birth year]

## 2017-09-29 ENCOUNTER — Ambulatory Visit: Payer: Medicare Other | Admitting: Occupational Therapy

## 2017-09-29 DIAGNOSIS — M6281 Muscle weakness (generalized): Secondary | ICD-10-CM

## 2017-09-29 DIAGNOSIS — M25532 Pain in left wrist: Secondary | ICD-10-CM

## 2017-09-29 DIAGNOSIS — M25632 Stiffness of left wrist, not elsewhere classified: Secondary | ICD-10-CM

## 2017-09-29 DIAGNOSIS — R278 Other lack of coordination: Secondary | ICD-10-CM

## 2017-09-29 NOTE — Therapy (Signed)
[facility] [address] [place], [place], [address] Phone: [phone]   Fax:  [phone]  Occupational Therapy Treatment  Patient Details  Name: Kevin Martin MRN: [id number] Date of Birth: 10-11-1950 Referring Provider: Dr. Erlinda Hong   Encounter Date: 09/29/2017  OT End of Session - 09/29/17 0819    Visit Number  6    Number of Visits  17    Date for OT Re-Evaluation  10/25/17    Authorization Type  Medicare     OT Start Time  0806 hotpack, 1 unit, pt had to leave early    OT Stop Time  0830    OT Time Calculation (min)  24 min       Past Medical History:  Diagnosis Date  . Aortic stenosis    moderate by 12/16/15 echo  . Atrial fibrillation ([place])   . Cardiomegaly   . CHF (congestive heart failure) ([place])   . History of radiation therapy 10/23/16- 12/11/16   Laryngopharynx and bilateral neck, 70 Gy in 35 fractions.   . Hypertension   . Shortness of breath dyspnea     Past Surgical History:  Procedure Laterality Date  . BRONCHOSCOPY  03/16/2017   performed at [facility]   . COLONOSCOPY    . DIRECT LARYNGOSCOPY N/A 09/19/2016   Procedure: DIRECT LARYNGOSCOPY with BIOPSY;  Surgeon: Izora Gala, MD;  Location: [facility];  Service: ENT;  Laterality: N/A;  . ESOPHAGOSCOPY N/A 09/19/2016   Procedure: ESOPHAGOSCOPY;  Surgeon: Izora Gala, MD;  Location: [facility];  Service: ENT;  Laterality: N/A;  . FRACTURE SURGERY Right    right arm  . NECK SURGERY      There were no vitals filed for this visit.  Subjective Assessment - 09/29/17 0813    Subjective   Only a little pain    Pertinent History   Pt is a 67 y.o male who sustained a left extra articular distal radius fx on 07/14/17 s/p fall    Patient Stated Goals  regain use of LUE    Currently in Pain?  Yes    Pain Score  2     Pain Location  Wrist    Pain Orientation  Left    Pain Descriptors / Indicators  Sore    Pain Type  Acute pain    Pain Onset  More than a month ago    Pain Frequency  Intermittent    Aggravating Factors   ROM, strengthening    Pain Relieving Factors  meds, heat             treatment:reatment:  Exercises   Exercises  Hand      Wrist Exercises   Other wrist exercises  A/ROM in wrist flex/ext, and RD/UD x 15 reps each. Progressed to P/ROM in wrist flexion and extension. Pt perfromed P/ROM stretch wrist flexion/ extension    Other wrist exercises  light wrist strengthening in wrist flex, ext, and RD x 10 reps each with 1 lb. weight. Supination/ ponation with light weight     Hand Exercises   Other Hand Exercises    Other Hand Exercises  Gripper set at level 2 resistance to pick up blocks for sustained grip strength - min  difficulty     Modalities   Modalities  Moist Heat , no adverse reactions     Moist Heat Therapy   Number Minutes Moist Heat  7 Minutes    Moist Heat Location  Wrist Pt had several  small   wounds will allow more time for healing prior to  fluidotherapy                   OT Short Term Goals - 09/24/17 0928      OT SHORT TERM GOAL #1   Title  I with inital HEP- due 09/25/17    Time  4    Status  Achieved      OT SHORT TERM GOAL #2   Title  Pt will increase wrist flexion/ extension by 15* for increased functional use during ADLS.    Baseline  wrist flexion/ extension 40/45    Time  4    Period  Weeks    Status  Achieved 55/60      OT SHORT TERM GOAL #3   Title  Pt will increase forearm supination/ pronation by 10 for increased functional use during ADLS.    Baseline  supination/ pronation : 70/60    Time  4    Period  Weeks    Status  Achieved 90/85        OT Long Term Goals - 09/17/17 1212      OT LONG TERM GOAL #1   Title  I with updated HEP.- due 10/25/17    Time  8    Period  Weeks    Status  New      OT LONG TERM GOAL #2   Title  Pt will resume use of LUE as dominant hand 90% of the time for ADLS/ IADLS with pain no greater than 3/10.    Time  8    Period  Weeks     Status  New      OT LONG TERM GOAL #3   Title  Pt will increase LUE grip strength to 30 lbs for increased functional use during ADLs.    Baseline  LUE grip 20 lbs    Time  8    Period  Weeks    Status  New      OT LONG TERM GOAL #4   Title  Pt will demonstrate wrist flexion/ extension WFLS for ADLS/IADLS.    Time  8    Period  Weeks    Status  New        Long Term Clinic Goals - 08/21/17 1021      CC Long Term Goal  #1   Title  Pt. will be knowledgeable about performing manual lymph drainage on himself.    Status  Achieved      CC Long Term Goal  #2   Title  Neck circumference measurement at 4 cm. superior to sternal notch will be reduced by at least 1.5 cm. to 49 cm. or less.    Status  Achieved      CC Long Term Goal  #3   Title  Neck circumference measurement at 8 cm. superior to sternal notch will reduce by at least 1.5 cm. to 51 cm. or less.    Status  Achieved      CC Long Term Goal  #4   Title  Neck circumference at 4 cm superior to sternal notch reduced to 48.5 or less    Status  Not Met      CC Long Term Goal  #5   Title  Neck circumference at 8 cm. superior to sternal notch reduced to 50.5 cm. or less    Status  Not Met      Head and  Neck Clinic Goals - 10/14/16 1049      Patient will be able to verbalize understanding of a home exercise program for cervical range of motion, posture, and walking.    Status  Achieved      Patient will be able to verbalize understanding of proper sitting and standing posture.    Status  Achieved      Patient will be able to verbalize understanding of lymphedema risk and availability of treatment for this condition.    Status  Achieved        Plan - 09/29/17 0841    Clinical Impression Statement  Pt progressing towards goals. Pt tolerating light strengthening to wrist. Pt had to leave early today unexpectedly.    Rehab Potential  Good    Current Impairments/barriers affecting progress:  undergoing  Chemo for active CA    OT Frequency  2x / week    OT Duration  8 weeks    OT Treatment/Interventions  Self-care/ADL training;Moist Heat;Fluidtherapy;DME and/or AE instruction;Splinting;Therapeutic activities;Scar mobilization;Therapeutic exercise;Neuromuscular education;Passive range of motion;Patient/family education;Manual Therapy;Paraffin    Plan  fluidotherapy vs. hotpack continue ROM and light strengthening,     Consulted and Agree with Plan of Care  Patient       Patient will benefit from skilled therapeutic intervention in order to improve the following deficits and impairments:  Impaired flexibility, Pain, Impaired UE functional use, Impaired perceived functional ability, Decreased knowledge of precautions, Decreased strength, Decreased range of motion, Decreased endurance, Decreased activity tolerance, Decreased coordination, Decreased skin integrity  Visit Diagnosis: Stiffness of left wrist, not elsewhere classified  Pain in left wrist  Muscle weakness (generalized)  Other lack of coordination    Problem List Patient Active Problem List   Diagnosis Date Noted  . Pain in left wrist 09/15/2017  . Closed Colles' fracture of left radius with routine healing 09/15/2017  . Fever 07/02/2017  . Neutropenia ([place]) 07/02/2017  . Febrile neutropenia ([place]) 07/02/2017  . Metastasis to lung ([place]) 02/12/2017  . Goals of care, counseling/discussion 02/12/2017  . Multiple lung nodules on CT 10/10/2016  . Alcoholism ([place]) 10/10/2016  . 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 Chronic atrial fibrillation ([place]) 02/14/2015  . Chronic diastolic CHF (congestive heart failure) ([place]) 02/14/2015  . Chronic diastolic heart failure ([place]) 02/14/2015  . Prediabetes 01/23/2015  . Tobacco abuse 01/23/2015  . Acute on chronic congestive heart failure ([place]) 01/15/2015  . Acute congestive heart failure with left ventricular diastolic dysfunction ([place]) 01/14/2015  . Alcohol abuse, daily use  01/14/2015  . Acute respiratory failure ([place]) 01/14/2015  . Hyponatremia 01/14/2015  . Acute right heart failure ([place]) 01/14/2015  . Congestive heart disease ([place])   . Right heart failure ([place]) 01/03/2015  . Cardiomegaly 01/03/2015  . Atrial fibrillation, controlled ([place]) 01/03/2015  . Moderate aortic stenosis 01/03/2015  . Chronic anticoagulation 01/03/2015  . Hypertension 01/03/2015  . Dyspnea 01/03/2015  . A-fib ([place]) 02/17/2014  . Long term current use of anticoagulant 02/17/2014  . Essential (primary) hypertension 11/01/2013  . Adult BMI 30+ 10/21/2013  . Adiposity 04/22/2013  . H/O cardiovascular disorder 04/22/2013  . Headache, tension-type 02/05/2013    Rowan Pollman 09/29/2017, 8:42 AM  [facility] [address] [place], [place], [address] Phone: [phone]   Fax:  [phone]  Name: Kevin Martin MRN: [id number] Date of Birth: 1951/06/26

## 2017-10-01 ENCOUNTER — Encounter: Payer: Medicare Other | Admitting: Occupational Therapy

## 2017-10-05 ENCOUNTER — Encounter: Payer: Self-pay | Admitting: Internal Medicine

## 2017-10-05 ENCOUNTER — Ambulatory Visit (INDEPENDENT_AMBULATORY_CARE_PROVIDER_SITE_OTHER): Payer: Medicare Other | Admitting: Internal Medicine

## 2017-10-05 VITALS — BP 128/56 | HR 60 | Ht 73.0 in | Wt 182.8 lb

## 2017-10-05 DIAGNOSIS — I10 Essential (primary) hypertension: Secondary | ICD-10-CM | POA: Diagnosis not present

## 2017-10-05 DIAGNOSIS — I350 Nonrheumatic aortic (valve) stenosis: Secondary | ICD-10-CM

## 2017-10-05 DIAGNOSIS — I482 Chronic atrial fibrillation, unspecified: Secondary | ICD-10-CM

## 2017-10-05 DIAGNOSIS — R0602 Shortness of breath: Secondary | ICD-10-CM

## 2017-10-05 NOTE — Progress Notes (Signed)
Cardiology Office Note   Date:  10/05/2017   ID:  EL PILE, DOB [birth year], MRN [id number]  PCP:  Berkley Harvey, NP  Cardiologist:   Dorris Carnes, MD   F/U of aortic stenosis, HTN, atrial fib     History of Present Illness: JUSTAN GAEDE is a 67 y.o. male with a history of HTN, chronic atrial fib, aortic stenosis , Tob use, EtOH use .  Bradycardia in past  B Blocker stopped   Patient also with squamous cell CA of lung  XRT and chemo at [facility]     I saw the fall 2018   Echo done fter showed LVEF normal  Mean graident across AV  Of 42 mm Hg  He is now on a new regimen of chemo therapy  Last scan shows some  Halting  in progression of dz  IN November after chemo he says he  felt weak  [place]   Broke wrist  Now getting chemo one time per four weeks   No dizziness  Some ankle edema    Some smothering sensation   No palpitations    Current Meds  Medication Sig  . acetaminophen (TYLENOL) 500 MG tablet Take 1,000 mg by mouth every 6 (six) hours as needed for mild pain.  Marland Kitchen amLODipine (NORVASC) 10 MG tablet Take 1 tablet (10 mg total) by mouth daily.  Marland Kitchen dexamethasone (DECADRON) 4 MG tablet Take 8 mg by mouth daily. Take for 2 days after chemo  . docusate sodium (COLACE) 100 MG capsule Take 200 mg by mouth daily.   . DPH-Lido-AlHydr-MgHydr-Simeth (FIRST-MOUTHWASH BLM) SUSP Use as directed 5 mLs in the mouth or throat every 6 (six) hours as needed (throat pain).  . folic acid (FOLVITE) 606 MCG tablet Take 800 mcg by mouth daily.   . furosemide (LASIX) 40 MG tablet Take 40 mg by mouth daily.   Marland Kitchen HYDROcodone-acetaminophen (NORCO) 10-325 MG tablet Take 1 tablet by mouth every 6 (six) hours as needed for moderate pain or severe pain.  Marland Kitchen lidocaine (XYLOCAINE) 2 % solution Patient: Mix 1part 2% viscous lidocaine, 1part H20. Swish and swallow 44mL of this mixture, 51min before meals and at bedtime, up to QID  . lisinopril (PRINIVIL,ZESTRIL) 40 MG tablet Take 1 tablet (40 mg total) by mouth daily.  (Patient taking differently: Take 20 mg by mouth daily. )  . ondansetron (ZOFRAN) 8 MG tablet Take 8 mg by mouth as directed. Take 8mg  by mouth twice daily for 2 days after chemo then every 8 hours as needed for nausea  . polyethylene glycol (MIRALAX / GLYCOLAX) packet Take 17 g by mouth daily.   . pravastatin (PRAVACHOL) 80 MG tablet Take 1 tablet (80 mg total) by mouth daily. (Patient taking differently: Take 80 mg by mouth every evening. )  . rivaroxaban (XARELTO) 20 MG TABS tablet Take 1 tablet (20 mg total) by mouth daily with supper.  . sucralfate (CARAFATE) 1 g tablet Dissolve 1 tablet in 10 mL H20 and swallow up to QID for sore throat.  . thiamine 100 MG tablet Take 1 tablet (100 mg total) by mouth daily.     Allergies:   Patient has no known allergies.   Past Medical History:  Diagnosis Date  . Aortic stenosis    moderate by 12/16/15 echo  . Atrial fibrillation ([place])   . Cardiomegaly   . CHF (congestive heart failure) ([place])   . History of radiation therapy 10/23/16- 12/11/16   Laryngopharynx and bilateral  neck, 70 Gy in 35 fractions.   . Hypertension   . Shortness of breath dyspnea     Past Surgical History:  Procedure Laterality Date  . BRONCHOSCOPY  03/16/2017   performed at [facility]   . COLONOSCOPY    . DIRECT LARYNGOSCOPY N/A 09/19/2016   Procedure: DIRECT LARYNGOSCOPY with BIOPSY;  Surgeon: Izora Gala, MD;  Location: [facility];  Service: ENT;  Laterality: N/A;  . ESOPHAGOSCOPY N/A 09/19/2016   Procedure: ESOPHAGOSCOPY;  Surgeon: Izora Gala, MD;  Location: [facility];  Service: ENT;  Laterality: N/A;  . FRACTURE SURGERY Right    right arm  . NECK SURGERY       Social History:  The patient  reports that he quit smoking about 13 months ago. His smoking use included cigars and cigarettes. He quit after 10.00 years of use. he has never used smokeless tobacco. He reports that he drinks alcohol. He reports that he does not use drugs.   Family History:  The patient's family history  includes Cancer in his father and mother; Stroke in his maternal grandfather.    ROS:  Please see the history of present illness. All other systems are reviewed and  Negative to the above problem except as noted.    PHYSICAL EXAM: VS:  BP (!) 128/56   Pulse 60   Ht 6\' 1"  (1.854 m)   Wt 182 lb 12.8 oz (82.9 kg)   SpO2 98%   BMI 24.12 kg/m   GEN: Well nourished, well developed, in no acute distress  HEENT: normal  Neck: no JVD, carotid bruits, or masses Cardiac: RRR; Gr III/Vi mif peaking murmur,  Decreased S2  No rubs, or gallops,no edema  Respiratory:  clear to auscultation bilaterally, normal work of breathing GI: soft, nontender, nondistended, + BS  No hepatomegaly  MS: no deformity Moving all extremities   Skin: warm and dry, no rash Neuro:  Strength and sensation are intact Psych: euthymic mood, full affect   EKG:  EKG is ordered today.  Atrial fib  56 bpm LVH with repol abnormalitliy  Lipid Panel No results found for: CHOL, TRIG, HDL, CHOLHDL, VLDL, LDLCALC, LDLDIRECT    Wt Readings from Last 3 Encounters:  10/05/17 182 lb 12.8 oz (82.9 kg)  07/05/17 186 lb (84.4 kg)  05/08/17 198 lb 9.6 oz (90.1 kg)      ASSESSMENT AND PLAN:  1  Aortic stenosis  WIll arrange for echo to reeval valve gradients   With ongoing Rx of cancer, need to watch volume status closely  Not candidate for intervention  2  HTN  BP is well controlled  Follow     3  Bradycardia  Pt asymtpmatic  4  Atrial fib  Remains on Xarelto   5  Lung CA  Rx at [facility]  Will contact once echo done    F/U in April   Current medicines are reviewed at length with the patient today.  The patient does not have concerns regarding medicines.  Signed, Dorris Carnes, MD  10/05/2017 9:14 AM    [place], [place],   [address] Phone: [phone]; Fax: [phone]

## 2017-10-05 NOTE — Patient Instructions (Signed)
Your physician recommends that you continue on your current medications as directed. Please refer to the Current Medication list given to you today.  Your physician has requested that you have an echocardiogram. Echocardiography is a painless test that uses sound waves to create images of your heart. It provides your doctor with information about the size and shape of your heart and how well your heart's chambers and valves are working. This procedure takes approximately one hour. There are no restrictions for this procedure.  (limited echo)  Your physician recommends that you schedule a follow-up appointment in: June, 2019 with Dr. Harrington Challenger

## 2017-10-06 ENCOUNTER — Ambulatory Visit: Payer: Medicare Other | Attending: Radiation Oncology | Admitting: Occupational Therapy

## 2017-10-06 DIAGNOSIS — M6281 Muscle weakness (generalized): Secondary | ICD-10-CM | POA: Insufficient documentation

## 2017-10-06 DIAGNOSIS — R278 Other lack of coordination: Secondary | ICD-10-CM | POA: Diagnosis present

## 2017-10-06 DIAGNOSIS — M25632 Stiffness of left wrist, not elsewhere classified: Secondary | ICD-10-CM | POA: Insufficient documentation

## 2017-10-06 DIAGNOSIS — M25532 Pain in left wrist: Secondary | ICD-10-CM | POA: Diagnosis present

## 2017-10-06 NOTE — Therapy (Addendum)
[facility] [address] [place], [place], [address] Phone: [phone]   Fax:  [phone]  Occupational Therapy Treatment  Patient Details  Name: Kevin Martin MRN: [id number] Date of Birth: [birth year] Referring Provider: Dr. Erlinda Hong   Encounter Date: 10/06/2017  OT End of Session - 10/06/17 0817    Visit Number  7    Number of Visits  17    Date for OT Re-Evaluation  10/25/17    Authorization Type  Medicare     OT Start Time  0806 2 units hot pack    OT Stop Time  0840    OT Time Calculation (min)  34 min    Activity Tolerance  Patient tolerated treatment well    Behavior During Therapy  [facility] for tasks assessed/performed       Past Medical History:  Diagnosis Date  . Aortic stenosis    moderate by 12/16/15 echo  . Atrial fibrillation ([place])   . Cardiomegaly   . CHF (congestive heart failure) ([place])   . History of radiation therapy 10/23/16- 12/11/16   Laryngopharynx and bilateral neck, 70 Gy in 35 fractions.   . Hypertension   . Shortness of breath dyspnea     Past Surgical History:  Procedure Laterality Date  . BRONCHOSCOPY  03/16/2017   performed at [facility]   . COLONOSCOPY    . DIRECT LARYNGOSCOPY N/A 09/19/2016   Procedure: DIRECT LARYNGOSCOPY with BIOPSY;  Surgeon: Izora Gala, MD;  Location: [facility];  Service: ENT;  Laterality: N/A;  . ESOPHAGOSCOPY N/A 09/19/2016   Procedure: ESOPHAGOSCOPY;  Surgeon: Izora Gala, MD;  Location: [facility];  Service: ENT;  Laterality: N/A;  . FRACTURE SURGERY Right    right arm  . NECK SURGERY      There were no vitals filed for this visit.  Subjective Assessment - 10/06/17 0813    Subjective   Only a little pain    Pertinent History   Pt is a 67 y.o male who sustained a left extra articular distal radius fx on 07/14/17 s/p fall    Patient Stated Goals  regain use of LUE    Currently in Pain?  Yes    Pain Score  2     Pain Location  Wrist    Pain Orientation  Left    Pain  Descriptors / Indicators  Aching    Pain Type  Acute pain    Pain Onset  More than a month ago    Pain Frequency  Intermittent    Aggravating Factors   ROM, strengthening    Pain Relieving Factors  meds , heat    Multiple Pain Sites  No           Treatment:   Exercises   Exercises  Hand      Wrist Exercises   Other wrist exercises  A/ROM in wrist flex/ext,  x 15 reps each. Progressed to P/ROM in wrist flexion and extension. Pt performed P/ROM stretch wrist flexion/ extension    Other wrist exercises  light wrist strengthening in wrist flex, ext, 15 reps each with 2 lb. weight. Supination/ pronation with light weight     Hand Exercises   Other Hand Exercises Upgraded putty to green, pt returned demonstration of exercises for grip and pinch   Other Hand Exercises  Gripper set at level 2 resistance to pick up blocks for sustained grip strength - min  difficulty     Modalities   Modalities  Moist Heat , no adverse reactions     Moist Heat Therapy   Number Minutes Moist Heat  8 Minutes    Moist Heat Location  Wrist Pt had several small   wounds will allow more time for healing prior to  fluidotherapy                       OT Short Term Goals - 09/24/17 9937      OT SHORT TERM GOAL #1   Title  I with inital HEP- due 09/25/17    Time  4    Status  Achieved      OT SHORT TERM GOAL #2   Title  Pt will increase wrist flexion/ extension by 15* for increased functional use during ADLS.    Baseline  wrist flexion/ extension 40/45    Time  4    Period  Weeks    Status  Achieved 55/60      OT SHORT TERM GOAL #3   Title  Pt will increase forearm supination/ pronation by 10 for increased functional use during ADLS.    Baseline  supination/ pronation : 70/60    Time  4    Period  Weeks    Status  Achieved 90/85        OT Long Term Goals - 10/06/17 0818      OT LONG TERM GOAL #1   Title  I with updated HEP.- due 10/25/17    Status  Achieved      OT LONG  TERM GOAL #2   Title  Pt will resume use of LUE as dominant hand 90% of the time for ADLS/ IADLS with pain no greater than 3/10.    Status  Not Met      OT LONG TERM GOAL #3   Title  Pt will increase LUE grip strength to 30 lbs for increased functional use during ADLs.    Status  Achieved 38 lbs      OT LONG TERM GOAL #4   Title  Pt will demonstrate wrist flexion/ extension WFLS for ADLS/IADLS.    Status  Achieved 60/60            Plan - 10/06/17 0825    Clinical Impression Statement  Pt progressing towards goals. Pt agrees with plans to d/c today and to transition to HEP.    Occupational performance deficits (Please refer to evaluation for details):  ADL's;IADL's;Leisure;Rest and Sleep;Social Participation    Rehab Potential  Good    OT Frequency  2x / week    OT Duration  8 weeks    OT Treatment/Interventions  Self-care/ADL training;Moist Heat;Fluidtherapy;DME and/or AE instruction;Splinting;Therapeutic activities;Scar mobilization;Therapeutic exercise;Neuromuscular education;Passive range of motion;Patient/family education;Manual Therapy;Paraffin    Plan  d/c OT    OT Home Exercise Plan  issued A/ROM HEP, issued P/ROM HEP, yellow putty    Consulted and Agree with Plan of Care  Patient       Patient will benefit from skilled therapeutic intervention in order to improve the following deficits and impairments:  Impaired flexibility, Pain, Impaired UE functional use, Impaired perceived functional ability, Decreased knowledge of precautions, Decreased strength, Decreased range of motion, Decreased endurance, Decreased activity tolerance, Decreased coordination, Decreased skin integrity  Visit Diagnosis: Stiffness of left wrist, not elsewhere classified  Pain in left wrist  Muscle weakness (generalized)  Other lack of coordination   OCCUPATIONAL THERAPY DISCHARGE SUMMARY   Current functional level related to goals / functional  outcomes: Pt made excellent overall progress,  see goals. Pt has not fully regained use of LUE as dominant hand but he plans to work on this as a part of his HEP.   Remaining deficits: Mildly decreased strength, mild pain   Education / Equipment: Pt demonstrates good progress towards goals. He agrees with plans for d/c. Pt was educated regarding: HEP and he returned demonstration.  Plan: Patient agrees to discharge.  Patient goals were partially met. Patient is being discharged due to being pleased with the current functional level.  ?????     Problem List Patient Active Problem List   Diagnosis Date Noted  . Pain in left wrist 09/15/2017  . Closed Colles' fracture of left radius with routine healing 09/15/2017  . Fever 07/02/2017  . Neutropenia ([place]) 07/02/2017  . Febrile neutropenia ([place]) 07/02/2017  . Metastasis to lung ([place]) 02/12/2017  . Goals of care, counseling/discussion 02/12/2017  . Multiple lung nodules on CT 10/10/2016  . Alcoholism ([place]) 10/10/2016  . 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 Chronic atrial fibrillation ([place]) 02/14/2015  . Chronic diastolic CHF (congestive heart failure) ([place]) 02/14/2015  . Chronic diastolic heart failure ([place]) 02/14/2015  . Prediabetes 01/23/2015  . Tobacco abuse 01/23/2015  . Acute on chronic congestive heart failure ([place]) 01/15/2015  . Acute congestive heart failure with left ventricular diastolic dysfunction ([place]) 01/14/2015  . Alcohol abuse, daily use 01/14/2015  . Acute respiratory failure ([place]) 01/14/2015  . Hyponatremia 01/14/2015  . Acute right heart failure ([place]) 01/14/2015  . Congestive heart disease ([place])   . Right heart failure ([place]) 01/03/2015  . Cardiomegaly 01/03/2015  . Atrial fibrillation, controlled ([place]) 01/03/2015  . Moderate aortic stenosis 01/03/2015  . Chronic anticoagulation 01/03/2015  . Hypertension 01/03/2015  . Dyspnea 01/03/2015  . A-fib ([place]) 02/17/2014  . Long term current use of anticoagulant 02/17/2014  . Essential (primary)  hypertension 11/01/2013  . Adult BMI 30+ 10/21/2013  . Adiposity 04/22/2013  . H/O cardiovascular disorder 04/22/2013  . Headache, tension-type 02/05/2013    RINE,KATHRYN 10/06/2017, 8:45 AM Theone Murdoch, OTR/L [phone] Phone: [phone] 8:45 AM 10/06/17 [facility] [address] [place], [place], [address] Phone: [phone]   Fax:  [phone]  Name: CORNELIUS MARULLO MRN: [id number] Date of Birth: 1950-09-19

## 2017-10-08 ENCOUNTER — Encounter: Payer: Medicare Other | Admitting: Occupational Therapy

## 2017-10-12 ENCOUNTER — Other Ambulatory Visit: Payer: Self-pay

## 2017-10-12 ENCOUNTER — Ambulatory Visit (HOSPITAL_COMMUNITY): Payer: Medicare Other | Attending: Cardiology

## 2017-10-12 DIAGNOSIS — I272 Pulmonary hypertension, unspecified: Secondary | ICD-10-CM | POA: Insufficient documentation

## 2017-10-12 DIAGNOSIS — I4891 Unspecified atrial fibrillation: Secondary | ICD-10-CM | POA: Diagnosis not present

## 2017-10-12 DIAGNOSIS — I083 Combined rheumatic disorders of mitral, aortic and tricuspid valves: Secondary | ICD-10-CM | POA: Insufficient documentation

## 2017-10-12 DIAGNOSIS — I509 Heart failure, unspecified: Secondary | ICD-10-CM | POA: Diagnosis not present

## 2017-10-12 DIAGNOSIS — I350 Nonrheumatic aortic (valve) stenosis: Secondary | ICD-10-CM | POA: Diagnosis not present

## 2017-10-12 DIAGNOSIS — R0602 Shortness of breath: Secondary | ICD-10-CM | POA: Diagnosis not present

## 2017-10-13 ENCOUNTER — Encounter: Payer: Medicare Other | Admitting: Occupational Therapy

## 2017-10-16 ENCOUNTER — Encounter: Payer: Medicare Other | Admitting: Occupational Therapy

## 2017-10-20 ENCOUNTER — Encounter: Payer: Medicare Other | Admitting: Occupational Therapy

## 2017-10-22 ENCOUNTER — Encounter: Payer: Medicare Other | Admitting: Occupational Therapy

## 2017-10-25 ENCOUNTER — Other Ambulatory Visit: Payer: Self-pay

## 2017-10-25 ENCOUNTER — Emergency Department (HOSPITAL_COMMUNITY): Payer: Medicare Other

## 2017-10-25 ENCOUNTER — Encounter (HOSPITAL_COMMUNITY): Payer: Self-pay | Admitting: Emergency Medicine

## 2017-10-25 ENCOUNTER — Emergency Department (HOSPITAL_COMMUNITY)
Admission: EM | Admit: 2017-10-25 | Discharge: 2017-10-25 | Disposition: A | Discharge status: Home / Self Care | Payer: Medicare Other | Attending: Emergency Medicine | Admitting: Emergency Medicine

## 2017-10-25 DIAGNOSIS — R011 Cardiac murmur, unspecified: Secondary | ICD-10-CM | POA: Insufficient documentation

## 2017-10-25 DIAGNOSIS — Z85118 Personal history of other malignant neoplasm of bronchus and lung: Secondary | ICD-10-CM | POA: Insufficient documentation

## 2017-10-25 DIAGNOSIS — R600 Localized edema: Secondary | ICD-10-CM | POA: Diagnosis not present

## 2017-10-25 DIAGNOSIS — Z87891 Personal history of nicotine dependence: Secondary | ICD-10-CM | POA: Insufficient documentation

## 2017-10-25 DIAGNOSIS — R0602 Shortness of breath: Secondary | ICD-10-CM

## 2017-10-25 DIAGNOSIS — Z79899 Other long term (current) drug therapy: Secondary | ICD-10-CM | POA: Insufficient documentation

## 2017-10-25 DIAGNOSIS — Z7901 Long term (current) use of anticoagulants: Secondary | ICD-10-CM | POA: Diagnosis not present

## 2017-10-25 DIAGNOSIS — Z85818 Personal history of malignant neoplasm of other sites of lip, oral cavity, and pharynx: Secondary | ICD-10-CM | POA: Diagnosis not present

## 2017-10-25 DIAGNOSIS — I110 Hypertensive heart disease with heart failure: Secondary | ICD-10-CM | POA: Diagnosis not present

## 2017-10-25 DIAGNOSIS — I5032 Chronic diastolic (congestive) heart failure: Secondary | ICD-10-CM | POA: Diagnosis not present

## 2017-10-25 DIAGNOSIS — Z923 Personal history of irradiation: Secondary | ICD-10-CM | POA: Diagnosis not present

## 2017-10-25 LAB — BASIC METABOLIC PANEL
Anion gap: 16 — ABNORMAL HIGH (ref 5–15)
BUN: 7 mg/dL (ref 6–20)
CHLORIDE: 89 mmol/L — AB (ref 101–111)
CO2: 28 mmol/L (ref 22–32)
Calcium: 9.5 mg/dL (ref 8.9–10.3)
Creatinine, Ser: 0.63 mg/dL (ref 0.61–1.24)
GFR calc non Af Amer: >60 mL/min (ref >60)
Glucose, Bld: 108 mg/dL — ABNORMAL HIGH (ref 65–99)
POTASSIUM: 3.6 mmol/L (ref 3.5–5.1)
SODIUM: 133 mmol/L — AB (ref 135–145)

## 2017-10-25 LAB — I-STAT TROPONIN, ED: Troponin i, poc: 0 ng/mL (ref 0.00–0.08)

## 2017-10-25 LAB — BRAIN NATRIURETIC PEPTIDE: B NATRIURETIC PEPTIDE 5: 366.3 pg/mL — AB (ref 0.0–100.0)

## 2017-10-25 LAB — CBC
HEMATOCRIT: 40.5 % (ref 39.0–52.0)
Hemoglobin: 13.5 g/dL (ref 13.0–17.0)
MCH: 31.6 pg (ref 26.0–34.0)
MCHC: 33.3 g/dL (ref 30.0–36.0)
MCV: 94.8 fL (ref 78.0–100.0)
Platelets: 159 10*3/uL (ref 150–400)
RBC: 4.27 MIL/uL (ref 4.22–5.81)
RDW: 14.7 % (ref 11.5–15.5)
WBC: 5.4 10*3/uL (ref 4.0–10.5)

## 2017-10-25 LAB — D-DIMER, QUANTITATIVE (NOT AT ARMC): D DIMER QUANT: 1.93 ug{FEU}/mL — AB (ref 0.00–0.50)

## 2017-10-25 MED ORDER — IOPAMIDOL (ISOVUE-370) INJECTION 76%
INTRAVENOUS | Status: AC
  Administered 2017-10-25: 100 mL via INTRAVENOUS
  Filled 2017-10-25: qty 100

## 2017-10-25 NOTE — Discharge Instructions (Signed)
Follow-up with your cardiologist this week as planned, also consider seeing your pulmonologist or primary care doctor for further evaluation as we discussed

## 2017-10-25 NOTE — ED Notes (Signed)
Patient is back to the room

## 2017-10-25 NOTE — ED Triage Notes (Signed)
Pt to ER for evaluation of shortness of breath and orthopnea x1 week, reports worsening lower extremity pitting edema, pt a/o x4. Currently under treatment for lung cancer with [facility]. All VSS stable.

## 2017-10-25 NOTE — ED Notes (Signed)
ED Provider at bedside. 

## 2017-10-25 NOTE — ED Notes (Signed)
D/c reviewed with patient and spouse. No further questions

## 2017-10-25 NOTE — ED Notes (Addendum)
Reports feeling difficulty breathing at night.  Currently immunotherapy for Lung Cancer

## 2017-10-25 NOTE — ED Provider Notes (Signed)
[facility] EMERGENCY DEPARTMENT Provider Note   CSN: [id number] Arrival date & time: 10/25/17  1101     History   Chief Complaint Chief Complaint  Patient presents with  . Chest Pain  . Shortness of Breath    HPI Kevin Martin is a 67 y.o. male.  HPI Pt has been having trouble with shortness of breath for the last week.  It gets worse at night.  He has been waking up around 2 am feeling smothered.  During the day it is not as bad.  No cough.  He has noticed leg swelling.     Pt has been getting immunotherapy for lung cancer.  Pt has history of throat ca treated with radiation. Past Medical History:  Diagnosis Date  . Aortic stenosis    moderate by 12/16/15 echo  . Atrial fibrillation ([place])   . Cardiomegaly   . CHF (congestive heart failure) ([place])   . History of radiation therapy 10/23/16- 12/11/16   Laryngopharynx and bilateral neck, 70 Gy in 35 fractions.   . Hypertension   . Shortness of breath dyspnea     Patient Active Problem List   Diagnosis Date Noted  . Pain in left wrist 09/15/2017  . Closed Colles' fracture of left radius with routine healing 09/15/2017  . Fever 07/02/2017  . Neutropenia ([place]) 07/02/2017  . Febrile neutropenia ([place]) 07/02/2017  . Metastasis to lung ([place]) 02/12/2017  . Goals of care, counseling/discussion 02/12/2017  . Multiple lung nodules on CT 10/10/2016  . Alcoholism ([place]) 10/10/2016  . 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 Chronic atrial fibrillation ([place]) 02/14/2015  . Chronic diastolic CHF (congestive heart failure) ([place]) 02/14/2015  . Chronic diastolic heart failure ([place]) 02/14/2015  . Prediabetes 01/23/2015  . Tobacco abuse 01/23/2015  . Acute on chronic congestive heart failure ([place]) 01/15/2015  . Acute congestive heart failure with left ventricular diastolic dysfunction ([place]) 01/14/2015  . Alcohol abuse, daily use 01/14/2015  . Acute respiratory failure ([place]) 01/14/2015  . Hyponatremia 01/14/2015  .  Acute right heart failure ([place]) 01/14/2015  . Congestive heart disease ([place])   . Right heart failure ([place]) 01/03/2015  . Cardiomegaly 01/03/2015  . Atrial fibrillation, controlled ([place]) 01/03/2015  . Moderate aortic stenosis 01/03/2015  . Chronic anticoagulation 01/03/2015  . Hypertension 01/03/2015  . Dyspnea 01/03/2015  . A-fib ([place]) 02/17/2014  . Long term current use of anticoagulant 02/17/2014  . Essential (primary) hypertension 11/01/2013  . Adult BMI 30+ 10/21/2013  . Adiposity 04/22/2013  . H/O cardiovascular disorder 04/22/2013  . Headache, tension-type 02/05/2013    Past Surgical History:  Procedure Laterality Date  . BRONCHOSCOPY  03/16/2017   performed at [facility]   . COLONOSCOPY    . DIRECT LARYNGOSCOPY N/A 09/19/2016   Procedure: DIRECT LARYNGOSCOPY with BIOPSY;  Surgeon: Izora Gala, MD;  Location: [facility];  Service: ENT;  Laterality: N/A;  . ESOPHAGOSCOPY N/A 09/19/2016   Procedure: ESOPHAGOSCOPY;  Surgeon: Izora Gala, MD;  Location: [facility];  Service: ENT;  Laterality: N/A;  . FRACTURE SURGERY Right    right arm  . NECK SURGERY         Home Medications    Prior to Admission medications   Medication Sig Start Date End Date Taking? Authorizing Provider  acetaminophen (TYLENOL) 500 MG tablet Take 1,000 mg by mouth every 6 (six) hours as needed for mild pain.   Yes [provider]  amLODipine (NORVASC) 10 MG tablet Take 1 tablet (10 mg total)  by mouth daily. Patient taking differently: Take 5 mg by mouth daily.  01/23/15  Yes Charlott Rakes, MD  docusate sodium (COLACE) 100 MG capsule Take 200 mg by mouth daily.    Yes [provider]  folic acid (FOLVITE) 102 MCG tablet Take 400 mcg by mouth daily.    Yes [provider]  furosemide (LASIX) 40 MG tablet Take 40 mg by mouth daily.  02/02/17  Yes [provider]  HYDROcodone-acetaminophen (NORCO) 10-325 MG tablet Take 1 tablet by mouth every 6 (six) hours as needed for moderate pain or  severe pain.   Yes [provider]  lidocaine (XYLOCAINE) 2 % solution Patient: Mix 1part 2% viscous lidocaine, 1part H20. Swish and swallow 38mL of this mixture, 29min before meals and at bedtime, up to QID 11/10/16  Yes Eppie Gibson, MD  ondansetron (ZOFRAN) 8 MG tablet Take 8 mg by mouth as directed. Take 8mg  by mouth twice daily for 2 days after chemo then every 8 hours as needed for nausea 04/22/17  Yes [provider]  polyethylene glycol (MIRALAX / GLYCOLAX) packet Take 17 g by mouth daily.    Yes [provider]  pravastatin (PRAVACHOL) 80 MG tablet Take 1 tablet (80 mg total) by mouth daily. Patient taking differently: Take 80 mg by mouth every evening.  01/23/15  Yes Newlin, Enobong, MD  rivaroxaban (XARELTO) 20 MG TABS tablet Take 1 tablet (20 mg total) by mouth daily with supper. 04/15/17  Yes Fay Records, MD  sucralfate (CARAFATE) 1 g tablet Dissolve 1 tablet in 10 mL H20 and swallow up to QID for sore throat. 11/14/16  Yes Eppie Gibson, MD  thiamine 100 MG tablet Take 1 tablet (100 mg total) by mouth daily. 01/16/15  Yes Elgergawy, Silver Huguenin, MD  lisinopril (PRINIVIL,ZESTRIL) 40 MG tablet Take 1 tablet (40 mg total) by mouth daily. Patient not taking: Reported on 10/25/2017 01/23/15   Charlott Rakes, MD    Family History Family History  Problem Relation Age of Onset  . Cancer Mother   . Cancer Father   . Stroke Maternal Grandfather   . Heart attack Neg Hx     Social History Social History   Tobacco Use  . Smoking status: Former Smoker    Years: 10.00    Types: Cigars, Cigarettes    Last attempt to quit: 09/01/2016    Years since quitting: 1.1  . Smokeless tobacco: Never Used  . Tobacco comment: quit cigarette smoking 10 years ago, smokes 5 cigars a day ([employer]ets)  Substance Use Topics  . Alcohol use: Yes    Alcohol/week: 0.0 oz    Comment: every day about 2 mixed drinks.   . Drug use: No     Allergies   Patient has no known  allergies.   Review of Systems Review of Systems  All other systems reviewed and are negative.    Physical Exam Updated Vital Signs BP 120/72   Pulse 88   Temp 98 F (36.7 C) (Oral)   Resp 17   Ht 1.854 m (6\' 1" )   Wt 82.6 kg (182 lb)   SpO2 100%   BMI 24.01 kg/m   Physical Exam  Constitutional: No distress.  HENT:  Head: Normocephalic and atraumatic.  Right Ear: External ear normal.  Left Ear: External ear normal.  Eyes: Conjunctivae are normal. Right eye exhibits no discharge. Left eye exhibits no discharge. No scleral icterus.  Neck: Neck supple. No tracheal deviation present.  Cardiovascular:  Normal rate, regular rhythm and intact distal pulses.  Murmur heard.  Systolic murmur is present. Pulmonary/Chest: Effort normal and breath sounds normal. No stridor. No respiratory distress. He has no wheezes. He has no rales.  Abdominal: Soft. Bowel sounds are normal. He exhibits no distension. There is no tenderness. There is no rebound and no guarding.  Musculoskeletal: He exhibits no tenderness.       Right lower leg: He exhibits edema. He exhibits no tenderness.       Left lower leg: He exhibits edema. He exhibits no tenderness.  Neurological: He is alert. He has normal strength. No cranial nerve deficit (no facial droop, extraocular movements intact, no slurred speech) or sensory deficit. He exhibits normal muscle tone. He displays no seizure activity. Coordination normal.  Skin: Skin is warm and dry. No rash noted.  Psychiatric: He has a normal mood and affect.  Nursing note and vitals reviewed.    ED Treatments / Results  Labs (all labs ordered are listed, but only abnormal results are displayed) Labs Reviewed  BASIC METABOLIC PANEL - Abnormal; Notable for the following components:      Result Value   Sodium 133 (*)    Chloride 89 (*)    Glucose, Bld 108 (*)    Anion gap 16 (*)    All other components within normal limits  BRAIN NATRIURETIC PEPTIDE - Abnormal;  Notable for the following components:   B Natriuretic Peptide 366.3 (*)    All other components within normal limits  D-DIMER, QUANTITATIVE (NOT AT [facility]) - Abnormal; Notable for the following components:   D-Dimer, Quant 1.93 (*)    All other components within normal limits  CBC  I-STAT TROPONIN, ED    EKG  EKG Interpretation  Date/Time:  Sunday October 25 2017 11:08:20 EST Ventricular Rate:  65 PR Interval:    QRS Duration: 92 QT Interval:  378 QTC Calculation: 393 R Axis:   83 Text Interpretation:  Atrial fibrillation Anteroseptal infarct , age undetermined ST & T wave abnormality, consider inferior ischemia Abnormal ECG lasteral t wave changes decreased from prior ECG Confirmed by Dorie Rank [phone]) on 10/25/2017 12:03:05 PM       Radiology Dg Chest 2 View  Result Date: 10/25/2017 CLINICAL DATA:  Chest pain and dyspnea EXAM: CHEST  2 VIEW COMPARISON:  07/01/2017 chest radiograph. FINDINGS: Stable cardiomediastinal silhouette with normal heart size. No pneumothorax. No pleural effusion. Lungs appear clear, with no acute consolidative airspace disease and no pulmonary edema. IMPRESSION: No active cardiopulmonary disease. Electronically Signed   By: Ilona Sorrel M.D.   On: 10/25/2017 11:29   Ct Angio Chest Pe W And/or Wo Contrast  Result Date: 10/25/2017 CLINICAL DATA:  Dyspnea. Orthopnea. Elevated D-dimer. History of laryngeal cancer with lung metastases. EXAM: CT ANGIOGRAPHY CHEST WITH CONTRAST TECHNIQUE: Multidetector CT imaging of the chest was performed using the standard protocol during bolus administration of intravenous contrast. Multiplanar CT image reconstructions and MIPs were obtained to evaluate the vascular anatomy. CONTRAST:  164mL ISOVUE-370 IOPAMIDOL (ISOVUE-370) INJECTION 76% COMPARISON:  03/23/2017 PET-CT. Chest radiograph from earlier today. FINDINGS: Cardiovascular: The study is high quality for the evaluation of pulmonary embolism. There are no filling defects in  the central, lobar, segmental or subsegmental pulmonary artery branches to suggest acute pulmonary embolism. Atherosclerotic thoracic aorta with stable ectatic 4.3 cm ascending thoracic aorta. Stable dilated main pulmonary artery (4.0 cm diameter). Mild cardiomegaly. No significant pericardial fluid/thickening. Left anterior descending and right coronary atherosclerosis. Aortic valvular calcifications.  Mediastinum/Nodes: No discrete thyroid nodules. Unremarkable esophagus. No axillary or hilar adenopathy. Stable mildly enlarged 1.1 cm left paratracheal node (series 5/image 56). Stable mildly enlarged 1.1 cm subcarinal node (series 5/image 69). Lungs/Pleura: No pneumothorax. Small dependent left pleural effusion with mild compressive atelectasis in the dependent left lower lobe. No right pleural effusion. Mild patchy bandlike opacities in the anterior apical upper lobes bilaterally are mildly increased and compatible with evolving postradiation change. Most of the previously visualized numerous scattered poorly marginated pulmonary nodules throughout both lungs have significantly decreased in size. For example basilar right lower lobe 8 mm nodule (series 6/image 121), decreased from 19 mm. Posterior right upper lobe 5 mm nodule (series 6/image 54), decreased from 13 mm. Lingular 6 mm nodule (series 6/image 85), decreased from 13 mm. A few bilateral upper lobe pulmonary nodules were not definitely previously visualized, including a 1.3 cm anterior apical right upper lobe nodule (series 6/image 36) and a peripheral left upper lobe 0.7 cm nodule (series 6/image 40). Upper abdomen: No acute abnormality. Musculoskeletal: No aggressive appearing focal osseous lesions. Symmetric moderate gynecomastia is mildly increased. Nearly healed lateral left fifth through seventh rib fractures. Marked thoracic spondylosis. Review of the MIP images confirms the above findings. IMPRESSION: 1. Cardiomegaly.  Small dependent left pleural  effusion. 2. Mixed interval changes with regards to the pulmonary metastases since the most recent available comparison study, the 03/23/2017 PET-CT. Previously visualized pulmonary metastases are all decreased in size. A few new bilateral upper lobe pulmonary metastases are identified. 3. Stable mild mediastinal lymphadenopathy. 4. Stable ectatic 4.3 cm ascending thoracic aorta. Recommend annual imaging followup by CTA or MRA. This recommendation follows 2010 ACCF/AHA/AATS/ACR/ASA/SCA/SCAI/SIR/STS/SVM Guidelines for the Diagnosis and Management of Patients with Thoracic Aortic Disease. Circulation. 2010; 121: B151-V616. 5. Stable dilated main pulmonary artery, suggesting chronic pulmonary arterial hypertension. 6. Two vessel coronary atherosclerosis. Aortic Atherosclerosis (ICD10-I70.0). Electronically Signed   By: Ilona Sorrel M.D.   On: 10/25/2017 15:05    Procedures Procedures (including critical care time)  Medications Ordered in ED Medications  iopamidol (ISOVUE-370) 76 % injection (100 mLs Intravenous Contrast Given 10/25/17 1425)     Initial Impression / Assessment and Plan / ED Course  I have reviewed the triage vital signs and the nursing notes.  Pertinent labs & imaging results that were available during my care of the patient were reviewed by me and considered in my medical decision making (see chart for details).   Patient presented to the emergency room for evaluation of shortness of breath that is primarily occurring at night.  Here in the emergency room the patient is not short of breath.  He is not tachypneic.  He has good oxygen saturations.   His ED workup shows several chronic issues but no definite acute cause for his shortness of breath.  There is no pneumonia.  He does not have a pulmonary embolism.  His BNP is mildly elevated but I do not think he has an acute congestive heart failure exacerbation.  Patient does have lung cancer as well as findings suggestive of chronic  pulmonary arterial hypertension.  Possible the patient might be having nocturnal hypoxia.  I mentioned possibly having him tested for that.  Patient mentioned that his doctor is already getting have him evaluated for that.  I think he stable for discharge at this time.  We discussed the findings.  They do have a follow-up appointment with their cardiologist this week.  Final Clinical Impressions(s) / ED Diagnoses   Final diagnoses:  Shortness of breath    ED Discharge Orders    None       Dorie Rank, MD 10/25/17 [phone]

## 2017-10-25 NOTE — ED Notes (Signed)
OFF THE FLOOR-CT ANGIO

## 2017-10-27 ENCOUNTER — Encounter: Payer: Medicare Other | Admitting: Occupational Therapy

## 2017-10-29 ENCOUNTER — Encounter: Payer: Medicare Other | Admitting: Occupational Therapy

## 2017-11-27 IMAGING — CR DG CHEST 2V
2 series · 2 of 2 positions shown · non-contrast
Comparison: PET CT 03/23/2017

CLINICAL DATA: Fever and shortness of breath. History of lung
cancer.

EXAM:
CHEST  2 VIEW

[w chest lat]
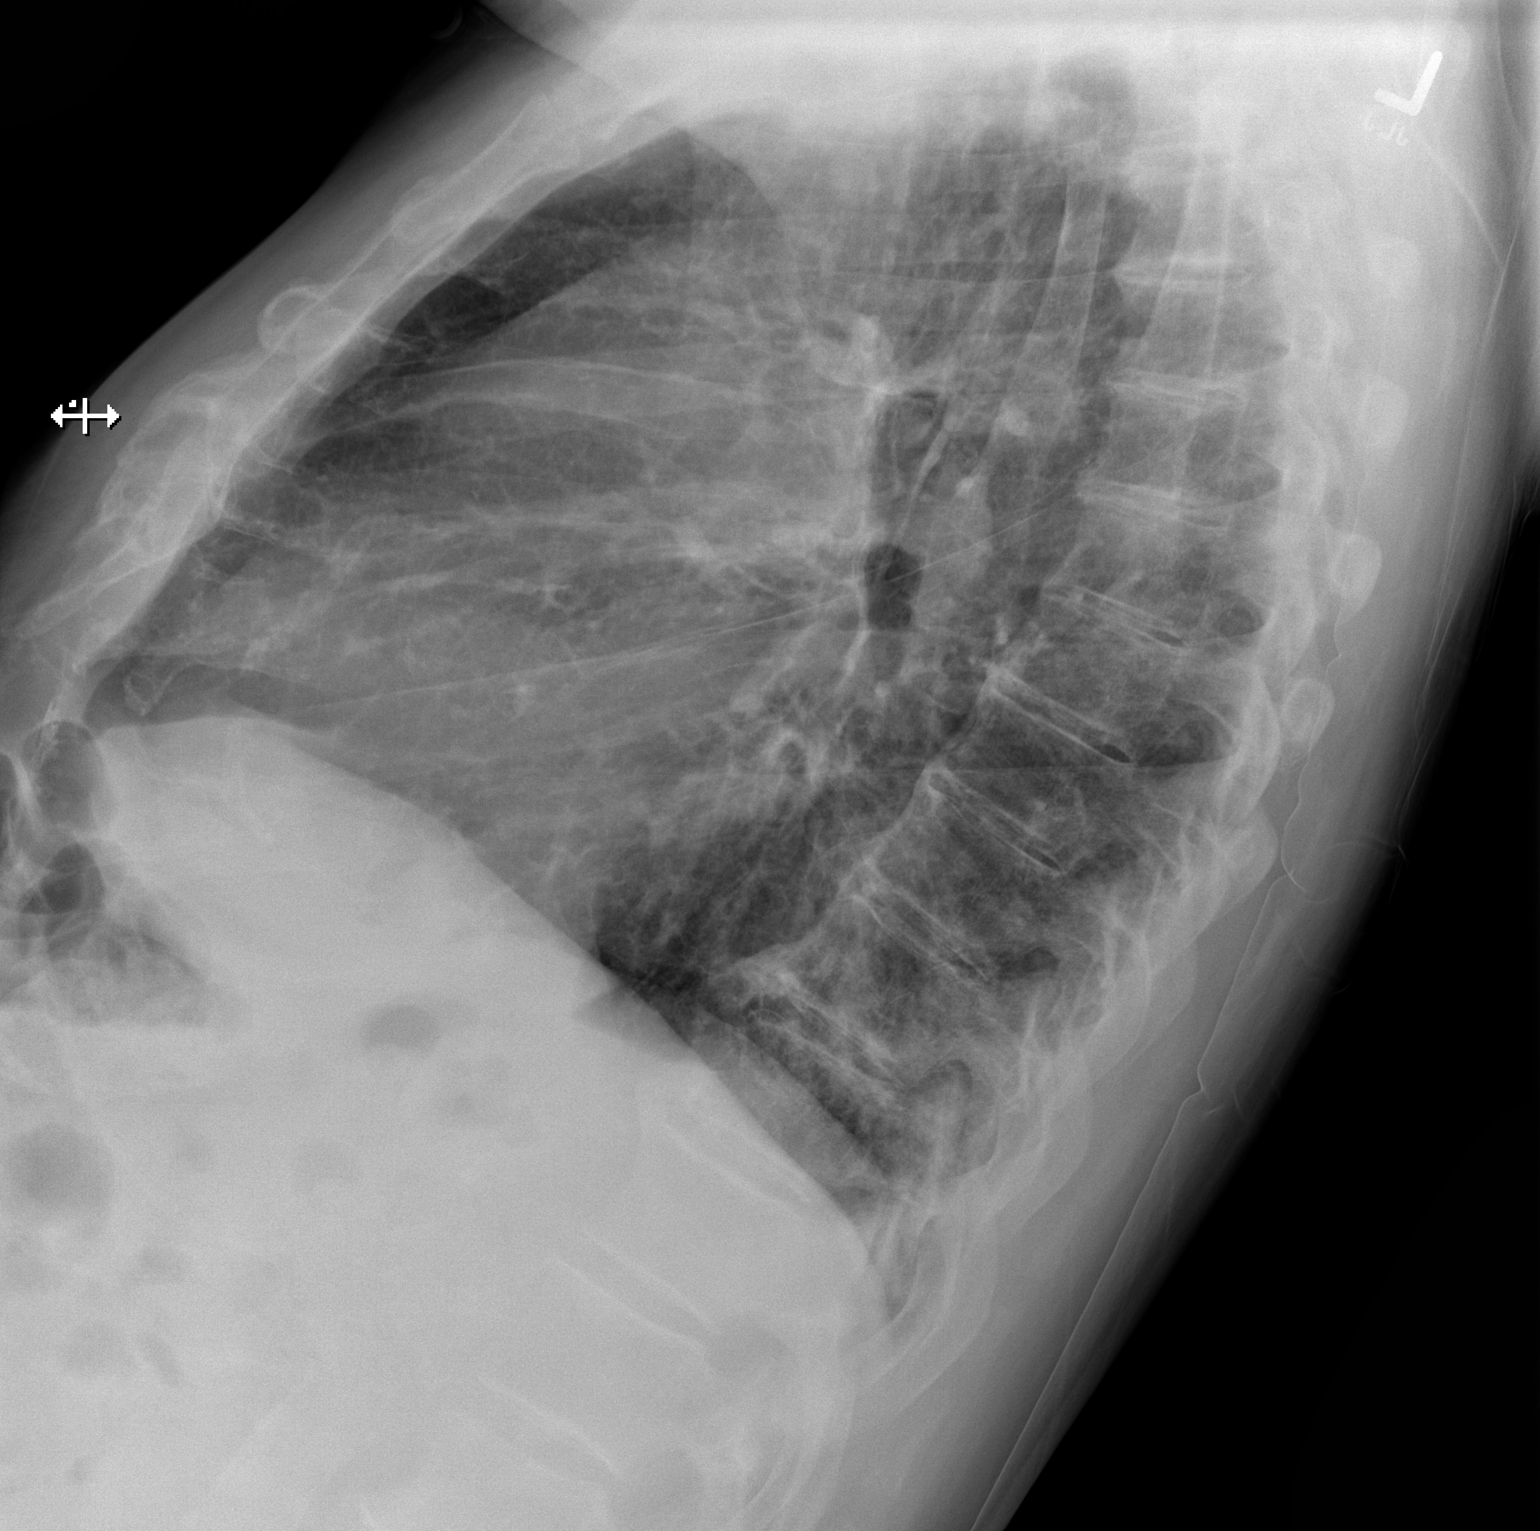

[x chest ap]
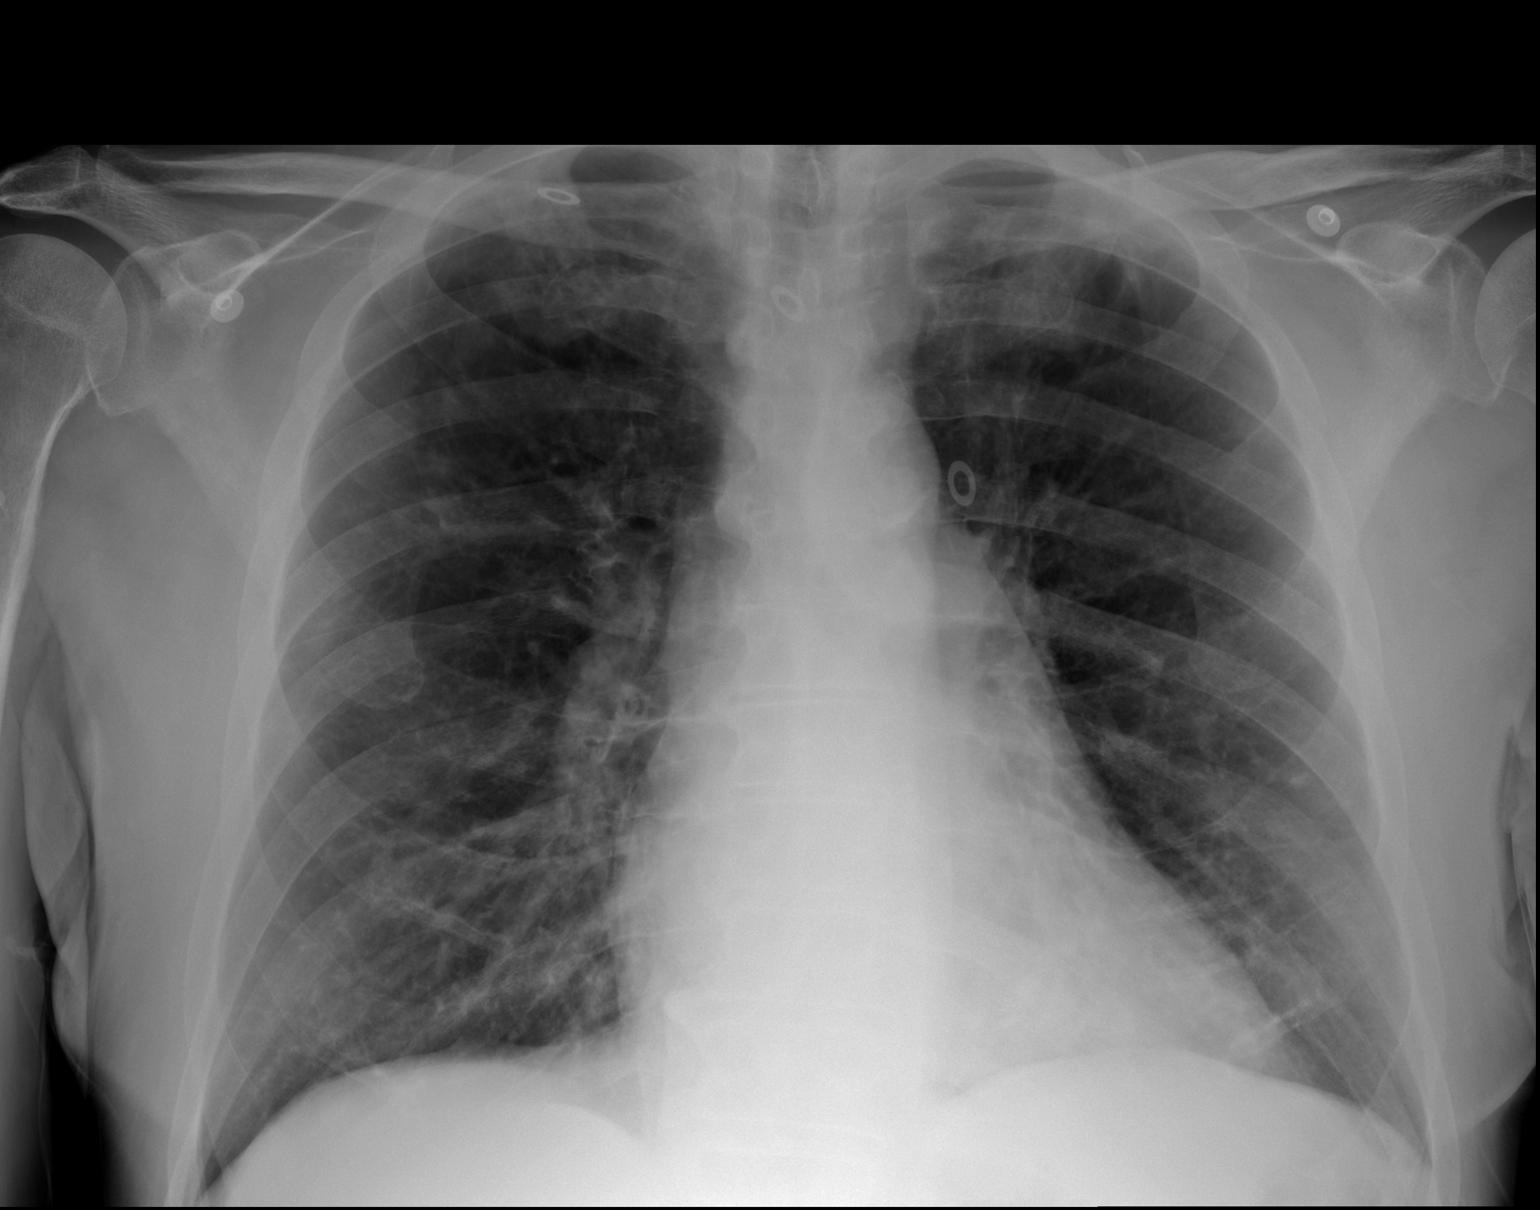

[2 of 2 positions shown; findings below may reference images not displayed]

FINDINGS: Normal cardiomediastinal contours. No focal airspace consolidation
or pulmonary edema. There are scattered nodular opacities within
both lungs that likely correlates to the numerous metastases
identified on prior CT, but are poorly characterized on this
radiograph. No pleural effusion or pneumothorax. Lung inflation is
normal. Three circular metallic densities are buttons on the
patient's sure.
IMPRESSION: 1. No active cardiopulmonary disease.
2. Scattered nodular opacities are nonspecific but probably
correlate to the nodular metastases identified on prior CT.

## 2017-12-01 ENCOUNTER — Telehealth: Payer: Self-pay | Admitting: *Deleted

## 2017-12-01 NOTE — Telephone Encounter (Addendum)
Oncology Nurse Navigator Documentation  Referral rec'd in Radiation Oncology 3/27 from Dr. Georgeanna Lea, [facility]  Reason for referral unclear based on his 3/27 PN sent with referral.  I spoke with pt wife 4/1, she was unaware of referral being placed.  Spoke with Clara (referral contact, [phone]) 4/1, asked for clarification, she indicated she would check and call back.  Called her today, she noted she spoke with Dr. Georgeanna Lea and his PA yesterday received no clarification.  She read his notes, was not clear why referral made.  She guided me to disregard referral pending further guidance from her.  I confirmed she has my phone #.  Gayleen Orem, RN, BSN Head & Neck Oncology Nurse [place] at [place] [phone]

## 2018-02-01 ENCOUNTER — Encounter (INDEPENDENT_AMBULATORY_CARE_PROVIDER_SITE_OTHER): Payer: Self-pay

## 2018-02-01 ENCOUNTER — Ambulatory Visit (INDEPENDENT_AMBULATORY_CARE_PROVIDER_SITE_OTHER): Payer: Medicare Other | Admitting: Internal Medicine

## 2018-02-01 ENCOUNTER — Encounter: Payer: Self-pay | Admitting: Internal Medicine

## 2018-02-01 VITALS — BP 122/62 | HR 79 | Ht 73.0 in | Wt 163.1 lb

## 2018-02-01 DIAGNOSIS — I482 Chronic atrial fibrillation, unspecified: Secondary | ICD-10-CM

## 2018-02-01 DIAGNOSIS — I350 Nonrheumatic aortic (valve) stenosis: Secondary | ICD-10-CM

## 2018-02-01 DIAGNOSIS — I10 Essential (primary) hypertension: Secondary | ICD-10-CM | POA: Diagnosis not present

## 2018-02-01 DIAGNOSIS — C139 Malignant neoplasm of hypopharynx, unspecified: Secondary | ICD-10-CM

## 2018-02-01 NOTE — Progress Notes (Signed)
Cardiology Office Note   Date:  02/01/2018   ID:  Kevin Martin, DOB Jan 03, 1951, MRN [id number]  PCP:  Berkley Harvey, NP  Cardiologist:   Dorris Carnes, MD   F/U of aortic stenosis, HTN, atrial fib     History of Present Illness: Kevin Martin is a 67 y.o. male with a history of HTN, chronic atrial fib, aortic stenosis , Tob use, EtOH use .  Bradycardia in past  B Blocker stopped   Patient also with squamous cell CA of lung  XRT and chemo at [facility]    I saw the pt in Feb 2019  Since seen he has seen Onc iat [facility]   Has appt soon   PET scan 3 wks  No CP  Breathing stable   ON O2   No PND   No dizziness/   No passing out   NO palpitations    Plays golf       Current Meds  Medication Sig  . acetaminophen (TYLENOL) 500 MG tablet Take 1,000 mg by mouth every 6 (six) hours as needed for mild pain.  Marland Kitchen amLODipine (NORVASC) 5 MG tablet Take 5 mg by mouth daily.  Marland Kitchen docusate sodium (COLACE) 100 MG capsule Take 200 mg by mouth daily.   . folic acid (FOLVITE) 250 MCG tablet Take 400 mcg by mouth daily.   Marland Kitchen HYDROcodone-acetaminophen (NORCO) 10-325 MG tablet Take 1 tablet by mouth every 6 (six) hours as needed for moderate pain or severe pain.  Marland Kitchen lidocaine (XYLOCAINE) 2 % solution Patient: Mix 1part 2% viscous lidocaine, 1part H20. Swish and swallow 70mL of this mixture, 74min before meals and at bedtime, up to QID  . lisinopril (PRINIVIL,ZESTRIL) 40 MG tablet Take 1 tablet (40 mg total) by mouth daily.  . ondansetron (ZOFRAN) 8 MG tablet Take 8 mg by mouth as directed. Take 8mg  by mouth twice daily for 2 days after chemo then every 8 hours as needed for nausea  . polyethylene glycol (MIRALAX / GLYCOLAX) packet Take 17 g by mouth daily.   . pravastatin (PRAVACHOL) 80 MG tablet Take 80 mg by mouth every evening.  . rivaroxaban (XARELTO) 20 MG TABS tablet Take 1 tablet (20 mg total) by mouth daily with supper.  . sucralfate (CARAFATE) 1 g tablet Dissolve 1 tablet in 10 mL H20 and swallow up to QID for  sore throat.  . thiamine 100 MG tablet Take 1 tablet (100 mg total) by mouth daily.     Allergies:   Patient has no known allergies.   Past Medical History:  Diagnosis Date  . Aortic stenosis    moderate by 12/16/15 echo  . Atrial fibrillation ([place])   . Cardiomegaly   . CHF (congestive heart failure) ([place])   . History of radiation therapy 10/23/16- 12/11/16   Laryngopharynx and bilateral neck, 70 Gy in 35 fractions.   . Hypertension   . Shortness of breath dyspnea     Past Surgical History:  Procedure Laterality Date  . BRONCHOSCOPY  03/16/2017   performed at [facility]   . COLONOSCOPY    . DIRECT LARYNGOSCOPY N/A 09/19/2016   Procedure: DIRECT LARYNGOSCOPY with BIOPSY;  Surgeon: Izora Gala, MD;  Location: [facility];  Service: ENT;  Laterality: N/A;  . ESOPHAGOSCOPY N/A 09/19/2016   Procedure: ESOPHAGOSCOPY;  Surgeon: Izora Gala, MD;  Location: [facility];  Service: ENT;  Laterality: N/A;  . FRACTURE SURGERY Right    right arm  . NECK SURGERY  Social History:  The patient  reports that he quit smoking about 17 months ago. His smoking use included cigars and cigarettes. He quit after 10.00 years of use. He has never used smokeless tobacco. He reports that he drinks alcohol. He reports that he does not use drugs.   Family History:  The patient's family history includes Cancer in his father and mother; Stroke in his maternal grandfather.    ROS:  Please see the history of present illness. All other systems are reviewed and  Negative to the above problem except as noted.    PHYSICAL EXAM: VS:  BP 122/62   Pulse 79   Ht 6\' 1"  (1.854 m)   Wt 74 kg (163 lb 1.9 oz)   SpO2 94%   BMI 21.52 kg/m   GEN: Well nourished, well developed, in no acute distress  HEENT: normal  Neck: JVP is norma  , carotid bruits, or masses Cardiac: RRR; Gr II/VI late peaking murmur,  Decreased S2  No rubs, or gallops,no edema  Respiratory:  Rhonchi   GI: soft, nontender, nondistended, + BS  No hepatomegaly    MS: no deformity Moving all extremities   Skin: warm and dry, no rash Neuro:  Strength and sensation are intact Psych: euthymic mood, full affect   EKG:  EKG is not  ordered today. Lipid Panel No results found for: CHOL, TRIG, HDL, CHOLHDL, VLDL, LDLCALC, LDLDIRECT    Wt Readings from Last 3 Encounters:  02/01/18 74 kg (163 lb 1.9 oz)  10/25/17 82.6 kg (182 lb)  10/05/17 82.9 kg (182 lb 12.8 oz)      ASSESSMENT AND PLAN:  1  Aortic stenosis  Echo shows severe AS  He denes symptoms   Reviewed with pt   WIth ongoing oncologic problems he is not a candidate for intervention   Will follow up in fall     2  HTN  BP is well controlled  Follow     3  Bradycardia  Pt denies symtpoms    4  Atrial fib  Remains on Xarelto Rate controlled   Hgb is OK    5  Sq cell CA  Rx at [facility]  Will contact once echo done    F/U in October   Current medicines are reviewed at length with the patient today.  The patient does not have concerns regarding medicines.  Signed, Dorris Carnes, MD  02/01/2018 8:20 AM    Kevin Martin, [place], [place]  [address] Phone: [phone]; Fax: [phone]

## 2018-02-01 NOTE — Patient Instructions (Signed)
Your physician recommends that you continue on your current medications as directed. Please refer to the Current Medication list given to you today. Your physician wants you to follow-up in: 4 months with Dr. Harrington Challenger. (Oct) You will receive a reminder letter in the mail two months in advance. If you don't receive a letter, please call our office to schedule the follow-up appointment.

## 2018-02-21 ENCOUNTER — Emergency Department (HOSPITAL_COMMUNITY)
Admission: EM | Admit: 2018-02-21 | Discharge: 2018-02-21 | Disposition: A | Discharge status: Home / Self Care | Payer: Medicare Other | Attending: Emergency Medicine | Admitting: Emergency Medicine

## 2018-02-21 ENCOUNTER — Encounter (HOSPITAL_COMMUNITY): Payer: Self-pay | Admitting: Emergency Medicine

## 2018-02-21 DIAGNOSIS — Z87891 Personal history of nicotine dependence: Secondary | ICD-10-CM | POA: Insufficient documentation

## 2018-02-21 DIAGNOSIS — R112 Nausea with vomiting, unspecified: Secondary | ICD-10-CM | POA: Diagnosis not present

## 2018-02-21 DIAGNOSIS — C3490 Malignant neoplasm of unspecified part of unspecified bronchus or lung: Secondary | ICD-10-CM | POA: Diagnosis not present

## 2018-02-21 DIAGNOSIS — R531 Weakness: Secondary | ICD-10-CM | POA: Diagnosis not present

## 2018-02-21 DIAGNOSIS — I110 Hypertensive heart disease with heart failure: Secondary | ICD-10-CM | POA: Insufficient documentation

## 2018-02-21 DIAGNOSIS — I5032 Chronic diastolic (congestive) heart failure: Secondary | ICD-10-CM | POA: Diagnosis not present

## 2018-02-21 DIAGNOSIS — R7989 Other specified abnormal findings of blood chemistry: Secondary | ICD-10-CM

## 2018-02-21 DIAGNOSIS — R748 Abnormal levels of other serum enzymes: Secondary | ICD-10-CM | POA: Diagnosis not present

## 2018-02-21 DIAGNOSIS — Z79899 Other long term (current) drug therapy: Secondary | ICD-10-CM | POA: Diagnosis not present

## 2018-02-21 DIAGNOSIS — R778 Other specified abnormalities of plasma proteins: Secondary | ICD-10-CM

## 2018-02-21 LAB — COMPREHENSIVE METABOLIC PANEL
ALT: 21 U/L (ref 17–63)
AST: 48 U/L — AB (ref 15–41)
Albumin: 3.4 g/dL — ABNORMAL LOW (ref 3.5–5.0)
Alkaline Phosphatase: 85 U/L (ref 38–126)
Anion gap: 13 (ref 5–15)
BILIRUBIN TOTAL: 0.9 mg/dL (ref 0.3–1.2)
BUN: 5 mg/dL — AB (ref 6–20)
CALCIUM: 8.4 mg/dL — AB (ref 8.9–10.3)
CO2: 25 mmol/L (ref 22–32)
CREATININE: 0.43 mg/dL — AB (ref 0.61–1.24)
Chloride: 97 mmol/L — ABNORMAL LOW (ref 101–111)
GFR calc Af Amer: >60 mL/min (ref >60)
GFR calc non Af Amer: >60 mL/min (ref >60)
Glucose, Bld: 96 mg/dL (ref 65–99)
Potassium: 3.6 mmol/L (ref 3.5–5.1)
Sodium: 135 mmol/L (ref 135–145)
TOTAL PROTEIN: 6.9 g/dL (ref 6.5–8.1)

## 2018-02-21 LAB — CBC WITH DIFFERENTIAL/PLATELET
BASOS ABS: 0 10*3/uL (ref 0.0–0.1)
Basophils Relative: 0 %
Eosinophils Absolute: 0 10*3/uL (ref 0.0–0.7)
Eosinophils Relative: 0 %
HEMATOCRIT: 36 % — AB (ref 39.0–52.0)
Hemoglobin: 12.4 g/dL — ABNORMAL LOW (ref 13.0–17.0)
LYMPHS ABS: 0.4 10*3/uL — AB (ref 0.7–4.0)
LYMPHS PCT: 5 %
MCH: 31.9 pg (ref 26.0–34.0)
MCHC: 34.4 g/dL (ref 30.0–36.0)
MCV: 92.5 fL (ref 78.0–100.0)
MONO ABS: 0.5 10*3/uL (ref 0.1–1.0)
Monocytes Relative: 6 %
NEUTROS ABS: 6.9 10*3/uL (ref 1.7–7.7)
Neutrophils Relative %: 89 %
Platelets: 157 10*3/uL (ref 150–400)
RBC: 3.89 MIL/uL — AB (ref 4.22–5.81)
RDW: 14.3 % (ref 11.5–15.5)
WBC: 7.8 10*3/uL (ref 4.0–10.5)

## 2018-02-21 LAB — LIPASE, BLOOD: LIPASE: 59 U/L — AB (ref 11–51)

## 2018-02-21 MED ORDER — SODIUM CHLORIDE 0.9 % IV BOLUS
1000.0000 mL | Freq: Once | INTRAVENOUS | Status: AC
  Administered 2018-02-21: 1000 mL via INTRAVENOUS

## 2018-02-21 MED ORDER — SUCRALFATE 1 G PO TABS: 1.0000 g | ORAL_TABLET | Freq: Four times a day (QID) | ORAL | 0 refills | Status: AC

## 2018-02-21 MED ORDER — FAMOTIDINE IN NACL 20-0.9 MG/50ML-% IV SOLN
20.0000 mg | Freq: Once | INTRAVENOUS | Status: AC
Start: 2018-02-21 — End: 2018-02-21
  Administered 2018-02-21: 20 mg via INTRAVENOUS
  Filled 2018-02-21: qty 50

## 2018-02-21 MED ORDER — ONDANSETRON HCL 4 MG/2ML IJ SOLN
4.0000 mg | Freq: Once | INTRAMUSCULAR | Status: AC
  Administered 2018-02-21: 4 mg via INTRAVENOUS
  Filled 2018-02-21: qty 2

## 2018-02-21 MED ORDER — ONDANSETRON 4 MG PO TBDP: 4.0000 mg | ORAL_TABLET | Freq: Three times a day (TID) | ORAL | 0 refills | Status: AC | PRN

## 2018-02-21 MED ORDER — OMEPRAZOLE 20 MG PO CPDR: 20.0000 mg | DELAYED_RELEASE_CAPSULE | Freq: Two times a day (BID) | ORAL | 0 refills | Status: AC

## 2018-02-21 NOTE — Discharge Instructions (Addendum)
Stop drinking vodka. Avoid alcohol, tobacco, caffeine, and anti-inflammatory medicines like Motrin Carafate 3 times per day.  This coats your stomach. Prilosec twice per day until his symptoms improved.  Then continue once per day. Zofran as needed for nausea

## 2018-02-21 NOTE — ED Triage Notes (Signed)
Brought in by EMS from home complain of N/V/weakness over a week. Per pt he had immunotherapy last Wednesday. Pt hx of Lung Ca.Patient currently on 3L o2 at home.  Patient denies pain.

## 2018-02-21 NOTE — ED Provider Notes (Signed)
[facility] DEPT Provider Note   CSN: [id number] Arrival date & time: 02/21/18  1906     History   Chief Complaint Chief Complaint  Patient presents with  . Weakness    HPI Kevin Martin is a 67 y.o. male.  Chief complaint is weakness, vomiting solid foods  HPI: 67 year old male.  History of pharyngeal cancer.  Status post radiation chemotherapy.  Undergoing immunotherapy for pulmonary metastasis.  He says and difficulty eating and not vomiting for the last week.  Is taking liquids without difficulty.  He does not have oropharyngeal dysphasia.  Every states solid foods do not say on his stomach.  He states that the only thing that seems to settle his stomach is "vodka and Pepsi".  He drinks this every day sometimes times per day.  Negative nonbilious emesis.  No difficulty breathing versus baseline.  No additional symptoms.  Past Medical History:  Diagnosis Date  . Aortic stenosis    moderate by 12/16/15 echo  . Atrial fibrillation ([place])   . Cardiomegaly   . CHF (congestive heart failure) ([place])   . History of radiation therapy 10/23/16- 12/11/16   Laryngopharynx and bilateral neck, 70 Gy in 35 fractions.   . Hypertension   . Shortness of breath dyspnea     Patient Active Problem List   Diagnosis Date Noted  . Pain in left wrist 09/15/2017  . Closed Colles' fracture of left radius with routine healing 09/15/2017  . Fever 07/02/2017  . Neutropenia ([place]) 07/02/2017  . Febrile neutropenia ([place]) 07/02/2017  . Metastasis to lung ([place]) 02/12/2017  . Goals of care, counseling/discussion 02/12/2017  . Multiple lung nodules on CT 10/10/2016  . Alcoholism ([place]) 10/10/2016  . 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 Chronic atrial fibrillation ([place]) 02/14/2015  . Chronic diastolic CHF (congestive heart failure) ([place]) 02/14/2015  . Chronic diastolic heart failure ([place]) 02/14/2015  . Prediabetes 01/23/2015  . Tobacco abuse 01/23/2015  . Acute on  chronic congestive heart failure ([place]) 01/15/2015  . Acute congestive heart failure with left ventricular diastolic dysfunction ([place]) 01/14/2015  . Alcohol abuse, daily use 01/14/2015  . Acute respiratory failure ([place]) 01/14/2015  . Hyponatremia 01/14/2015  . Acute right heart failure ([place]) 01/14/2015  . Congestive heart disease ([place])   . Right heart failure ([place]) 01/03/2015  . Cardiomegaly 01/03/2015  . Atrial fibrillation, controlled ([place]) 01/03/2015  . Moderate aortic stenosis 01/03/2015  . Chronic anticoagulation 01/03/2015  . Hypertension 01/03/2015  . Dyspnea 01/03/2015  . A-fib ([place]) 02/17/2014  . Long term current use of anticoagulant 02/17/2014  . Essential (primary) hypertension 11/01/2013  . Adult BMI 30+ 10/21/2013  . Adiposity 04/22/2013  . H/O cardiovascular disorder 04/22/2013  . Headache, tension-type 02/05/2013    Past Surgical History:  Procedure Laterality Date  . BRONCHOSCOPY  03/16/2017   performed at [facility]   . COLONOSCOPY    . DIRECT LARYNGOSCOPY N/A 09/19/2016   Procedure: DIRECT LARYNGOSCOPY with BIOPSY;  Surgeon: Izora Gala, MD;  Location: [facility];  Service: ENT;  Laterality: N/A;  . ESOPHAGOSCOPY N/A 09/19/2016   Procedure: ESOPHAGOSCOPY;  Surgeon: Izora Gala, MD;  Location: [facility];  Service: ENT;  Laterality: N/A;  . FRACTURE SURGERY Right    right arm  . NECK SURGERY          Home Medications    Prior to Admission medications   Medication Sig Start Date End Date Taking? Authorizing Provider  acetaminophen (TYLENOL) 500 MG tablet Take 1,000 mg  by mouth every 6 (six) hours as needed for mild pain.   Yes [provider]  amLODipine (NORVASC) 10 MG tablet Take 5 mg by mouth daily.   Yes [provider]  docusate sodium (COLACE) 250 MG capsule Take 250 mg by mouth daily.   Yes [provider]  folic acid (FOLVITE) 161 MCG tablet Take 400 mcg by mouth daily.    Yes [provider]  HYDROcodone-acetaminophen (NORCO)  10-325 MG tablet Take 1 tablet by mouth every 6 (six) hours as needed for moderate pain or severe pain.   Yes [provider]  ondansetron (ZOFRAN) 8 MG tablet Take 8 mg by mouth as directed. Take 8mg  by mouth twice daily for 2 days after chemo then every 8 hours as needed for nausea 04/22/17  Yes [provider]  potassium chloride SA (K-DUR,KLOR-CON) 20 MEQ tablet Take 20 mEq by mouth daily.   Yes [provider]  ranitidine (ZANTAC) 150 MG tablet Take 150 mg by mouth daily.   Yes [provider]  rivaroxaban (XARELTO) 20 MG TABS tablet Take 1 tablet (20 mg total) by mouth daily with supper. 04/15/17  Yes Fay Records, MD  Thiamine HCl (VITAMIN B-1) 250 MG tablet Take 250 mg by mouth daily.   Yes [provider]  lidocaine (XYLOCAINE) 2 % solution Patient: Mix 1part 2% viscous lidocaine, 1part H20. Swish and swallow 68mL of this mixture, 93min before meals and at bedtime, up to QID Patient not taking: Reported on 02/21/2018 11/10/16   Eppie Gibson, MD  omeprazole (PRILOSEC) 20 MG capsule Take 1 capsule (20 mg total) by mouth 2 (two) times daily. 02/21/18   Tanna Furry, MD  ondansetron (ZOFRAN ODT) 4 MG disintegrating tablet Take 1 tablet (4 mg total) by mouth every 8 (eight) hours as needed for nausea. 02/21/18   Tanna Furry, MD  sucralfate (CARAFATE) 1 g tablet Take 1 tablet (1 g total) by mouth 4 (four) times daily. 02/21/18   Tanna Furry, MD    Family History Family History  Problem Relation Age of Onset  . Cancer Mother   . Cancer Father   . Stroke Maternal Grandfather   . Heart attack Neg Hx     Social History Social History   Tobacco Use  . Smoking status: Former Smoker    Years: 10.00    Types: Cigars, Cigarettes    Last attempt to quit: 09/01/2016    Years since quitting: 1.4  . Smokeless tobacco: Never Used  . Tobacco comment: quit cigarette smoking 10 years ago, smokes 5 cigars a day ([employer])  Substance Use Topics  . Alcohol  use: Yes    Alcohol/week: 0.0 oz    Comment: every day about 2 mixed drinks.   . Drug use: No     Allergies   Patient has no known allergies.   Review of Systems Review of Systems  Constitutional: Negative for appetite change, chills, diaphoresis, fatigue and fever.  HENT: Negative for mouth sores, sore throat and trouble swallowing.   Eyes: Negative for visual disturbance.  Respiratory: Negative for cough, chest tightness, shortness of breath and wheezing.   Cardiovascular: Negative for chest pain.  Gastrointestinal: Positive for nausea and vomiting. Negative for abdominal distention and diarrhea.  Endocrine: Negative for polydipsia, polyphagia and polyuria.  Genitourinary: Negative for dysuria, frequency and hematuria.  Musculoskeletal: Negative for gait problem.  Skin: Negative for color change, pallor and rash.  Neurological: Negative for dizziness, syncope, light-headedness and headaches.  Hematological: Does not bruise/bleed easily.  Psychiatric/Behavioral: Negative for behavioral problems and confusion.     Physical Exam Updated Vital Signs BP 126/74   Pulse 88   SpO2 96%   Physical Exam  Constitutional: He is oriented to person, place, and time. He appears well-developed and well-nourished. No distress.  HENT:  Head: Normocephalic.  Eyes: Pupils are equal, round, and reactive to light. Conjunctivae are normal. No scleral icterus.  Neck: Normal range of motion. Neck supple. No thyromegaly present.  Cardiovascular: Normal rate and regular rhythm. Exam reveals no gallop and no friction rub.  No murmur heard. Pulmonary/Chest: No respiratory distress. He has no wheezes. He has no rales.  Dyspnea with conversation.  He has to pause and catch his breath at times.  He states this is not unusual for him.  Family reiterates this.  He is saturating 97% on 2 L  Abdominal: He exhibits no distension. There is no tenderness. There is no rebound.  Soft benign abdomen.  No  guarding  Musculoskeletal: Normal range of motion.  Neurological: He is alert and oriented to person, place, and time.  Skin: Skin is warm and dry. No rash noted.  Psychiatric: He has a normal mood and affect. His behavior is normal.     ED Treatments / Results  Labs (all labs ordered are listed, but only abnormal results are displayed) Labs Reviewed  CBC WITH DIFFERENTIAL/PLATELET - Abnormal; Notable for the following components:      Result Value   RBC 3.89 (*)    Hemoglobin 12.4 (*)    HCT 36.0 (*)    Lymphs Abs 0.4 (*)    All other components within normal limits  COMPREHENSIVE METABOLIC PANEL - Abnormal; Notable for the following components:   Chloride 97 (*)    BUN 5 (*)    Creatinine, Ser 0.43 (*)    Calcium 8.4 (*)    Albumin 3.4 (*)    AST 48 (*)    All other components within normal limits  LIPASE, BLOOD - Abnormal; Notable for the following components:   Lipase 59 (*)    All other components within normal limits    EKG None  Radiology No results found.  Procedures Procedures (including critical care time)  Medications Ordered in ED Medications  sodium chloride 0.9 % bolus 1,000 mL (0 mLs Intravenous Stopped 02/21/18 2144)  ondansetron (ZOFRAN) injection 4 mg (4 mg Intravenous Given 02/21/18 2020)  famotidine (PEPCID) IVPB 20 mg premix (0 mg Intravenous Stopped 02/21/18 2144)     Initial Impression / Assessment and Plan / ED Course  I have reviewed the triage vital signs and the nursing notes.  Pertinent labs & imaging results that were available during my care of the patient were reviewed by me and considered in my medical decision making (see chart for details).    Given antiemetics and fluids.  Labs are reassuring.  Of asked him to stop drinking vodka daily.  Prescription for Carafate Protonix.  Primary care follow-up.  Final Clinical Impressions(s) / ED Diagnoses   Final diagnoses:  Weakness  Non-intractable vomiting with nausea, unspecified  vomiting type  Elevated troponin    ED Discharge Orders        Ordered    ondansetron (ZOFRAN ODT) 4 MG disintegrating tablet  Every 8 hours PRN     02/21/18 2204    omeprazole (PRILOSEC) 20 MG capsule  2 times daily     02/21/18 2204    sucralfate (CARAFATE) 1 g  tablet  4 times daily     02/21/18 2204       Tanna Furry, MD 02/21/18 2204

## 2018-02-21 NOTE — ED Notes (Signed)
Bed: WA02 Expected date:  Expected time:  Means of arrival:  Comments: 67 yo Ca pt- weakness

## 2018-03-01 ENCOUNTER — Telehealth: Payer: Self-pay | Admitting: *Deleted

## 2018-03-01 NOTE — Telephone Encounter (Signed)
Oncology Nurse Navigator Documentation  Returned VMM from patient's wife indicating he died last week March 17, 2023.  I expressed condolence on behalf of [facility] staff.   Gayleen Orem, RN, BSN Head & Neck Oncology Nurse [place] at [place] [phone]

## 2018-03-01 DEATH — deceased

## 2018-03-23 IMAGING — CR DG CHEST 2V
2 series · 2 of 2 positions shown · non-contrast
Comparison: 07/01/2017 chest radiograph.

CLINICAL DATA: Chest pain and dyspnea

EXAM:
CHEST  2 VIEW

[chest lat]
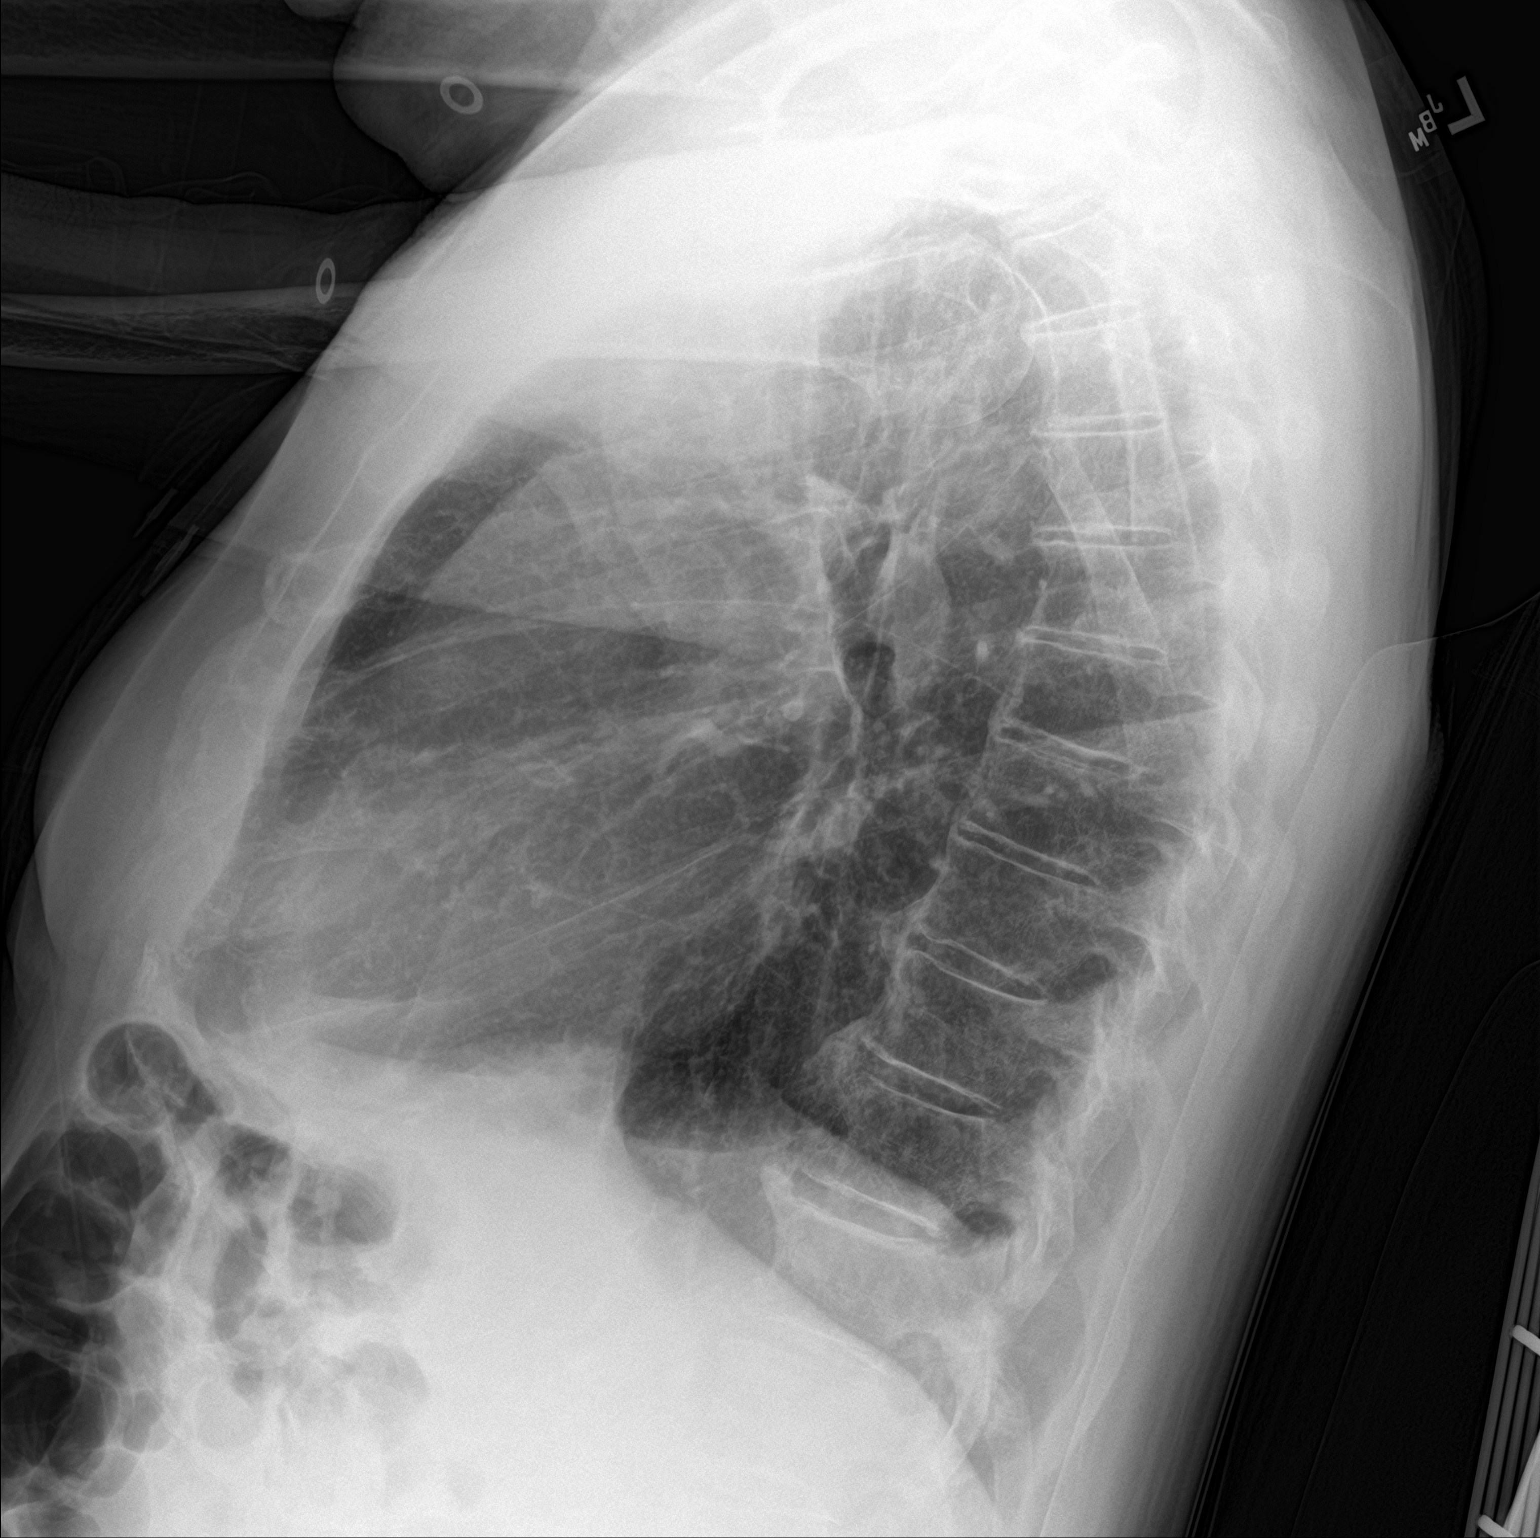

[chest ap]
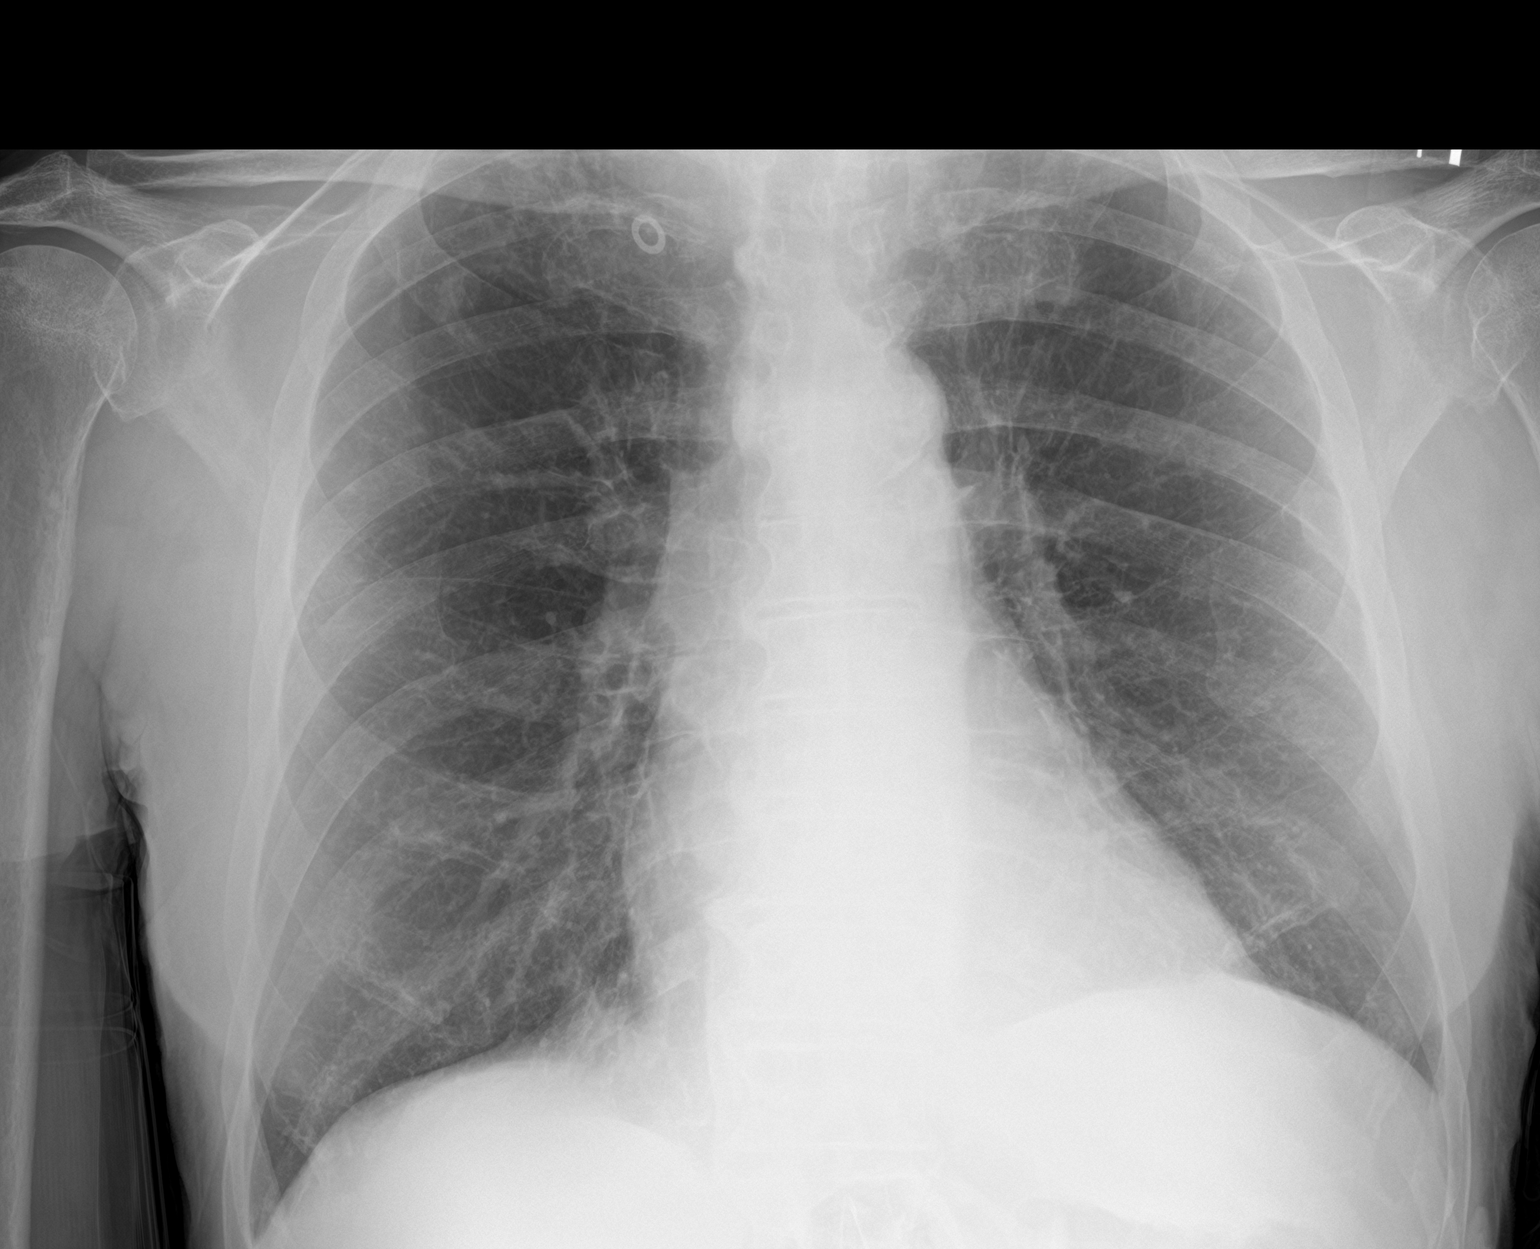

[2 of 2 positions shown; findings below may reference images not displayed]

FINDINGS: Stable cardiomediastinal silhouette with normal heart size. No
pneumothorax. No pleural effusion. Lungs appear clear, with no acute
consolidative airspace disease and no pulmonary edema.
IMPRESSION: No active cardiopulmonary disease.

## 2018-03-23 IMAGING — CT CT ANGIO CHEST
3 of 7 series · 17 of 36 positions shown · IV contrast (Omni 300)
Comparison: 03/23/2017 PET-CT. Chest radiograph from earlier today.

CLINICAL DATA: Dyspnea. Orthopnea. Elevated D-dimer. History of
laryngeal cancer with lung metastases.

EXAM:
CT ANGIOGRAPHY CHEST WITH CONTRAST
TECHNIQUE: Multidetector CT imaging of the chest was performed using the
standard protocol during bolus administration of intravenous
contrast. Multiplanar CT image reconstructions and MIPs were
obtained to evaluate the vascular anatomy.
CONTRAST:  100mL JR31VX-18H IOPAMIDOL ([id number]) INJECTION 76%

[Series 6: pe lung · axial · 0.88mm/px · z∈[+1188,+1338]mm · 4 of 152 slices shown]
[im 26/152  mediastinal]
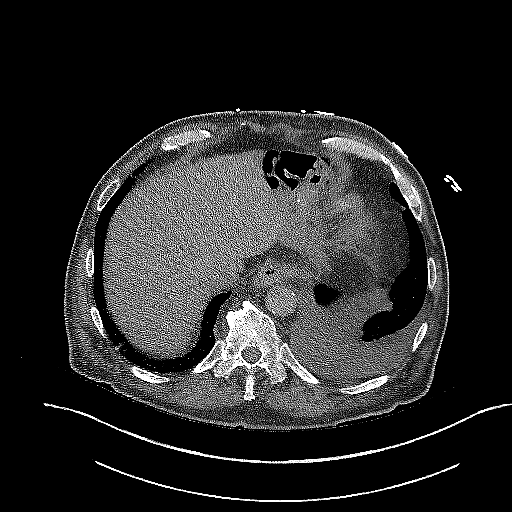
[im 51/152  mediastinal]
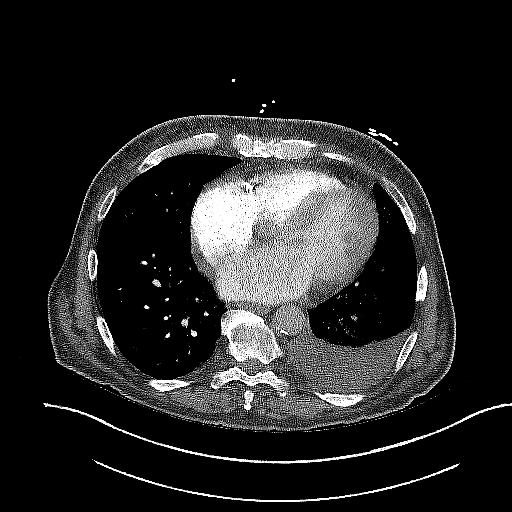
[im 76/152  mediastinal]
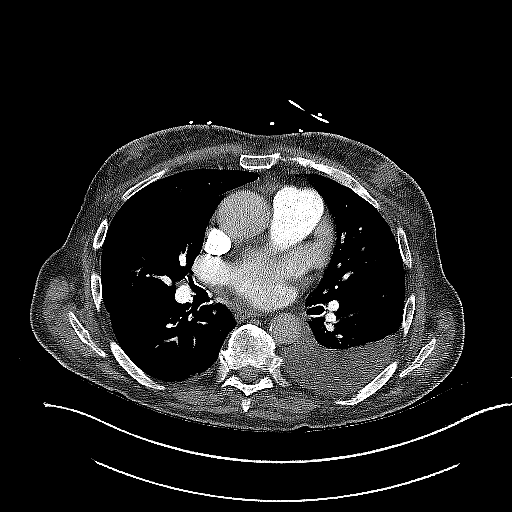
[im 101/152  mediastinal]
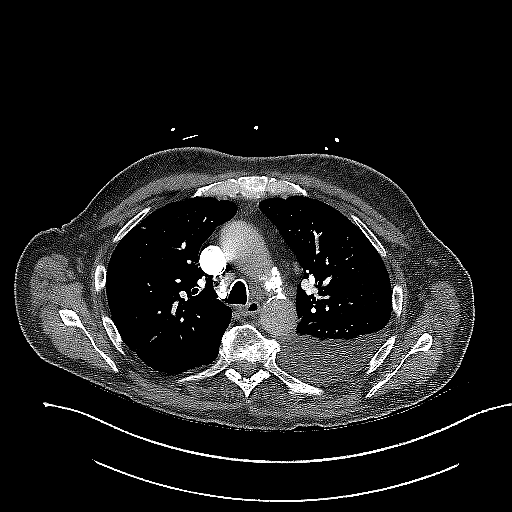

[Series 7: pe thins · axial · 0.88mm/px · z∈[+1152,+1416]mm · 12 of 312 slices shown]
[im 24/312  lung]
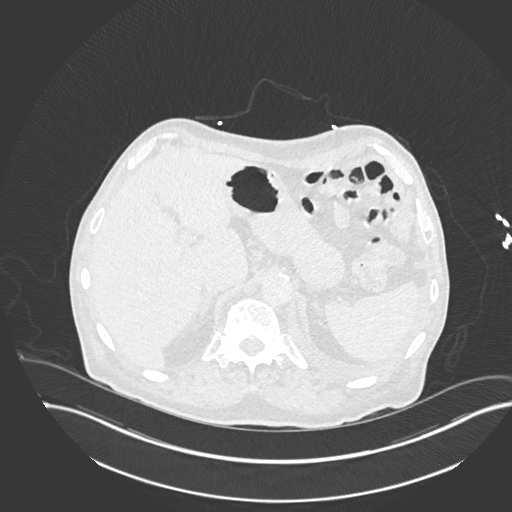
[im 48/312  mediastinal]
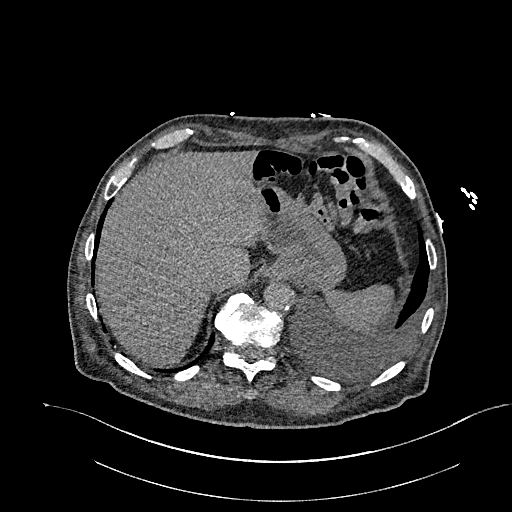
[im 72/312  lung]
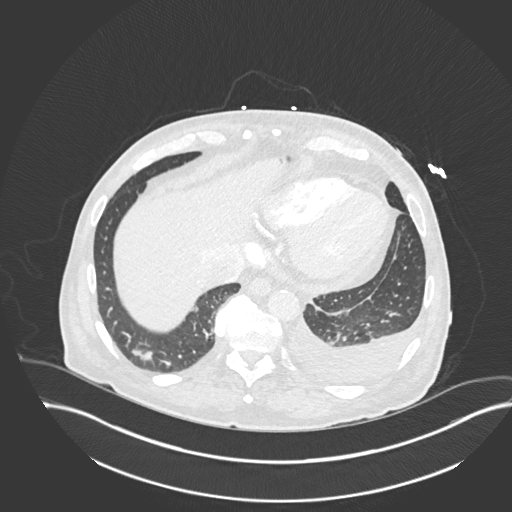
[im 96/312  mediastinal]
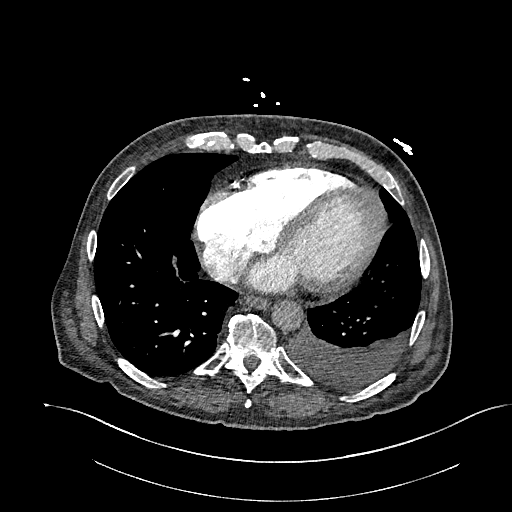
[im 120/312  lung]
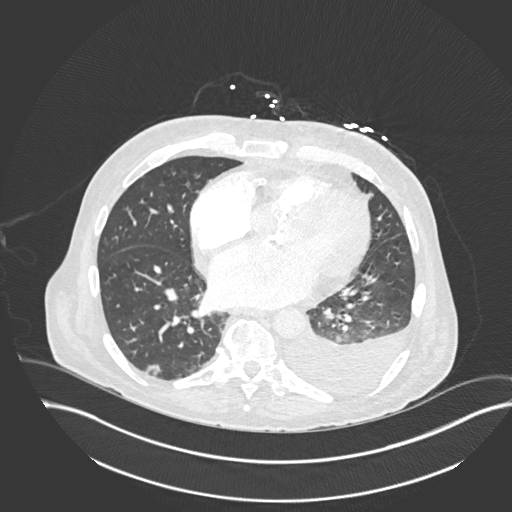
[im 144/312  mediastinal]
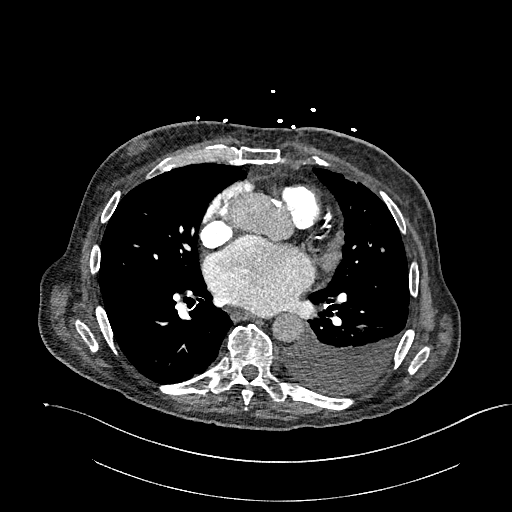
[im 168/312  lung]
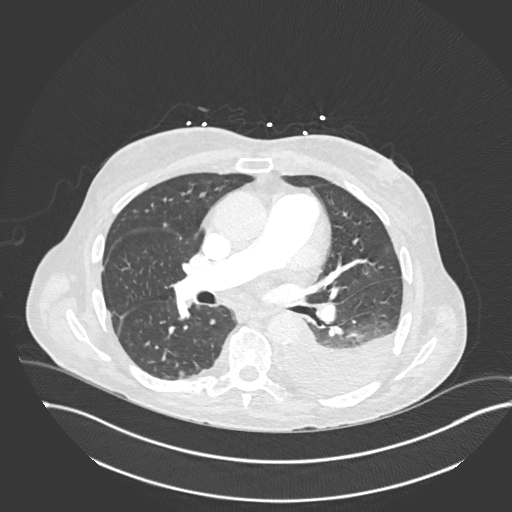
[im 192/312  mediastinal]
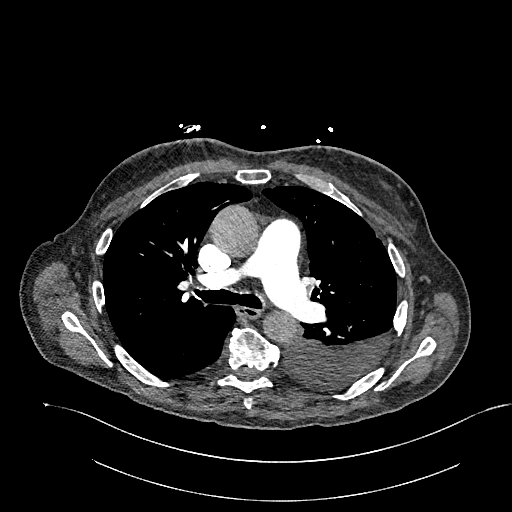
[im 216/312  lung]
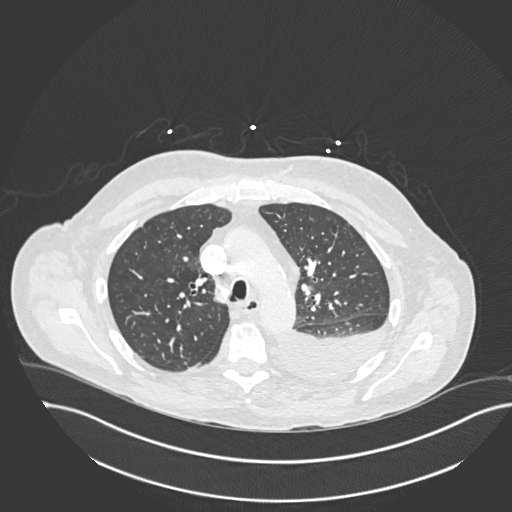
[im 240/312  mediastinal]
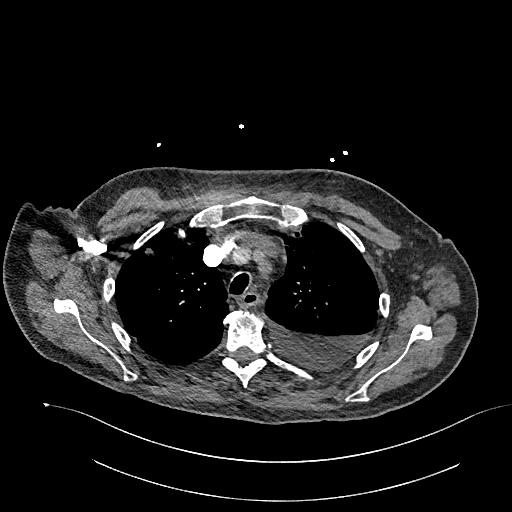
[im 264/312  lung]
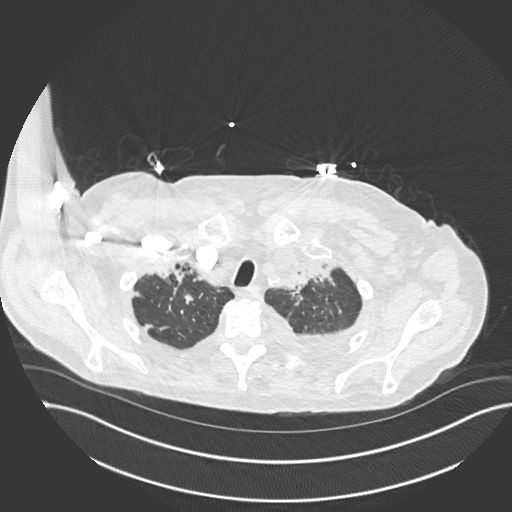
[im 288/312  mediastinal]
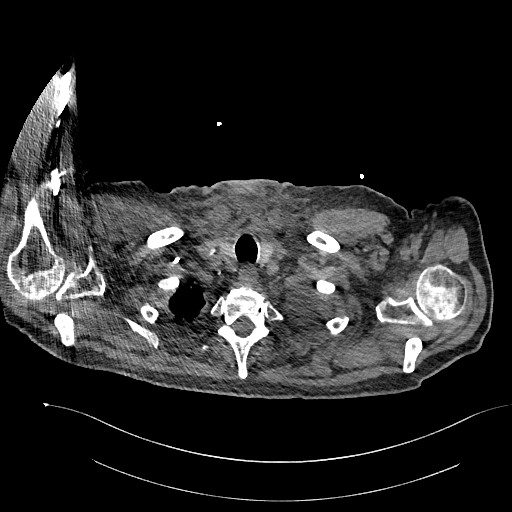

[Series 8: pe 2mm cor · coronal · 0.61mm/px · 1 of 151 slices shown]
[im 76/151  mediastinal]
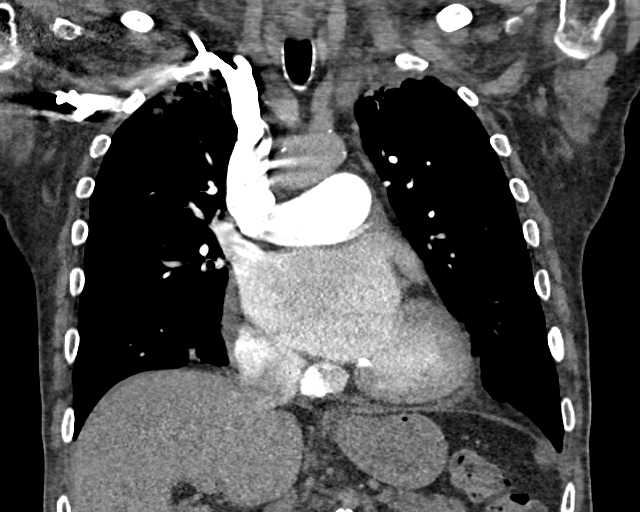

[17 of 36 positions shown; findings below may reference images not displayed]

FINDINGS: Cardiovascular: The study is high quality for the evaluation of
pulmonary embolism. There are no filling defects in the central,
lobar, segmental or subsegmental pulmonary artery branches to
suggest acute pulmonary embolism. Atherosclerotic thoracic aorta
with stable ectatic 4.3 cm ascending thoracic aorta. Stable dilated
main pulmonary artery (4.0 cm diameter). Mild cardiomegaly. No
significant pericardial fluid/thickening. Left anterior descending
and right coronary atherosclerosis. Aortic valvular calcifications.

Mediastinum/Nodes: No discrete thyroid nodules. Unremarkable
esophagus. No axillary or hilar adenopathy. Stable mildly enlarged
1.1 cm left paratracheal node (series 5/image 56). Stable mildly
enlarged 1.1 cm subcarinal node (series 5/image 69).

Lungs/Pleura: No pneumothorax. Small dependent left pleural effusion
with mild compressive atelectasis in the dependent left lower lobe..
No right pleural effusion. Mild patchy bandlike opacities in the
anterior apical upper lobes bilaterally are mildly increased and
compatible with evolving postradiation change. Most of the
previously visualized numerous scattered poorly marginated pulmonary
nodules throughout both lungs have significantly decreased in size.
For example basilar right lower lobe 8 mm nodule (series 6/image
121), decreased from 19 mm. Posterior right upper lobe 5 mm nodule
(series 6/image 54), decreased from 13 mm. Lingular 6 mm nodule
(series 6/image 85), decreased from 13 mm. A few bilateral upper
lobe pulmonary nodules were not definitely previously visualized,
including a 1.3 cm anterior apical right upper lobe nodule (series
6/image 36) and a peripheral left upper lobe 0.7 cm nodule (series
6/image 40).

Upper abdomen: No acute abnormality.

Musculoskeletal: No aggressive appearing focal osseous lesions.
Symmetric moderate gynecomastia is mildly increased. Nearly healed
lateral left fifth through seventh rib fractures. Marked thoracic
spondylosis.

Review of the MIP images confirms the above findings.
IMPRESSION: 1. Cardiomegaly.  Small dependent left pleural effusion.
2. Mixed interval changes with regards to the pulmonary metastases
since the most recent available comparison study, the 03/23/2017
PET-CT. Previously visualized pulmonary metastases are all decreased
in size. A few new bilateral upper lobe pulmonary metastases are
identified.
3. Stable mild mediastinal lymphadenopathy.
4. Stable ectatic 4.3 cm ascending thoracic aorta. Recommend annual
imaging followup by CTA or MRA. This recommendation follows 6777
ACCF/AHA/AATS/ACR/ASA/SCA/OMAHE KONO/LIENAD/MAESTRO SONY/JIM Guidelines for the
Diagnosis and Management of Patients with Thoracic Aortic Disease.
Circulation. 6777; 121: [id number].
5. Stable dilated main pulmonary artery, suggesting chronic
pulmonary arterial hypertension.
6. Two vessel coronary atherosclerosis.

Aortic Atherosclerosis ([id number]).
